# Patient Record
Sex: Female | Born: 1960 | Race: Black or African American | Hispanic: No | Marital: Married | Smoking: Current every day smoker
Health system: Southern US, Community
[De-identification: ages and names within clinical notes are randomized; demographics above are authoritative.]

## PROBLEM LIST (undated history)

## (undated) DIAGNOSIS — G4730 Sleep apnea, unspecified: Secondary | ICD-10-CM

## (undated) DIAGNOSIS — E041 Nontoxic single thyroid nodule: Secondary | ICD-10-CM

## (undated) DIAGNOSIS — Z9221 Personal history of antineoplastic chemotherapy: Secondary | ICD-10-CM

## (undated) DIAGNOSIS — R011 Cardiac murmur, unspecified: Secondary | ICD-10-CM

## (undated) DIAGNOSIS — C50919 Malignant neoplasm of unspecified site of unspecified female breast: Secondary | ICD-10-CM

## (undated) DIAGNOSIS — F1011 Alcohol abuse, in remission: Secondary | ICD-10-CM

## (undated) DIAGNOSIS — H409 Unspecified glaucoma: Secondary | ICD-10-CM

## (undated) DIAGNOSIS — F1911 Other psychoactive substance abuse, in remission: Secondary | ICD-10-CM

## (undated) DIAGNOSIS — Z923 Personal history of irradiation: Secondary | ICD-10-CM

## (undated) HISTORY — DX: Other psychoactive substance abuse, in remission: F19.11

## (undated) HISTORY — DX: Cardiac murmur, unspecified: R01.1

## (undated) HISTORY — PX: BREAST BIOPSY: SHX20

## (undated) HISTORY — DX: Malignant neoplasm of unspecified site of unspecified female breast: C50.919

## (undated) HISTORY — DX: Alcohol abuse, in remission: F10.11

## (undated) HISTORY — DX: Unspecified glaucoma: H40.9

---

## 1983-03-29 HISTORY — PX: UNILATERAL SALPINGECTOMY: SHX6160

## 1983-03-29 HISTORY — PX: DILATION AND CURETTAGE OF UTERUS: SHX78

## 1989-03-28 HISTORY — PX: TUBAL LIGATION: SHX77

## 2002-03-28 HISTORY — PX: BREAST LUMPECTOMY: SHX2

## 2017-03-28 HISTORY — PX: BREAST BIOPSY: SHX20

## 2017-04-04 LAB — HM MAMMOGRAPHY

## 2017-08-26 HISTORY — PX: MASTECTOMY: SHX3

## 2019-01-25 ENCOUNTER — Other Ambulatory Visit: Payer: Self-pay

## 2019-01-25 DIAGNOSIS — Z20822 Contact with and (suspected) exposure to covid-19: Secondary | ICD-10-CM

## 2019-01-26 LAB — NOVEL CORONAVIRUS, NAA: SARS-CoV-2, NAA: NOT DETECTED

## 2019-02-13 ENCOUNTER — Other Ambulatory Visit: Payer: Self-pay

## 2019-02-13 ENCOUNTER — Telehealth: Payer: Self-pay | Admitting: Family Medicine

## 2019-02-13 ENCOUNTER — Ambulatory Visit (INDEPENDENT_AMBULATORY_CARE_PROVIDER_SITE_OTHER): Payer: Managed Care, Other (non HMO) | Admitting: Family Medicine

## 2019-02-13 ENCOUNTER — Encounter: Payer: Self-pay | Admitting: Family Medicine

## 2019-02-13 VITALS — BP 146/70 | HR 79 | Temp 98.3°F | Resp 16 | Ht 62.25 in | Wt 224.2 lb

## 2019-02-13 DIAGNOSIS — Z9012 Acquired absence of left breast and nipple: Secondary | ICD-10-CM

## 2019-02-13 DIAGNOSIS — R0789 Other chest pain: Secondary | ICD-10-CM

## 2019-02-13 DIAGNOSIS — Z720 Tobacco use: Secondary | ICD-10-CM | POA: Insufficient documentation

## 2019-02-13 DIAGNOSIS — Z122 Encounter for screening for malignant neoplasm of respiratory organs: Secondary | ICD-10-CM | POA: Diagnosis not present

## 2019-02-13 DIAGNOSIS — R918 Other nonspecific abnormal finding of lung field: Secondary | ICD-10-CM

## 2019-02-13 DIAGNOSIS — Z853 Personal history of malignant neoplasm of breast: Secondary | ICD-10-CM

## 2019-02-13 DIAGNOSIS — H4010X0 Unspecified open-angle glaucoma, stage unspecified: Secondary | ICD-10-CM | POA: Insufficient documentation

## 2019-02-13 DIAGNOSIS — H4010X3 Unspecified open-angle glaucoma, severe stage: Secondary | ICD-10-CM

## 2019-02-13 DIAGNOSIS — Z9013 Acquired absence of bilateral breasts and nipples: Secondary | ICD-10-CM | POA: Insufficient documentation

## 2019-02-13 HISTORY — DX: Other nonspecific abnormal finding of lung field: R91.8

## 2019-02-13 HISTORY — DX: Unspecified open-angle glaucoma, stage unspecified: H40.10X0

## 2019-02-13 HISTORY — DX: Personal history of malignant neoplasm of breast: Z85.3

## 2019-02-13 HISTORY — DX: Acquired absence of left breast and nipple: Z90.12

## 2019-02-13 HISTORY — DX: Other chest pain: R07.89

## 2019-02-13 HISTORY — DX: Tobacco use: Z72.0

## 2019-02-13 MED ORDER — TRAVOPROST (BAK FREE) 0.004 % OP SOLN: 1.0000 [drp] | Freq: Every day | OPHTHALMIC | 1 refills | Status: DC

## 2019-02-13 NOTE — Patient Instructions (Signed)
It was great to meet you!  Our referral coordinator will call you to help with referrals. It may take 1-2 weeks.   I will work on getting records.   Plan to check in with me at least 1 time per year and as often as you need to

## 2019-02-13 NOTE — Assessment & Plan Note (Signed)
Details unclear but was seeing pulm for cancer screening. Referral today and plan to obtain records.

## 2019-02-13 NOTE — Assessment & Plan Note (Signed)
Suspect this is post-op muscle related issue given duration. Offered PT now, but due to insurance pt would like to focus on oncology f/u to discuss. Placed referral today.

## 2019-02-13 NOTE — Progress Notes (Signed)
Subjective:     Jaime [name] is a 58 y.o. female presenting for Establish Care (previous PCP Dr Stefani Dama in [place]) and Spasms on the left side of the chest (off and on for a few months. Had mastectomy done also on that side.)     HPI   #Breast cancer - last episode was Jan 2019 - needs an oncologist - had a mastectomy - has not started the pill that her previous oncologist recommended - has not seen oncologist since Sept 2019 - moved and had insurance - is not currently on treatment - will come on randomly - sharp pain - starts under the armpit and travels to the center of the chest - will massage, raise her arm, drink water to help with them - occur - 2-3 times a day but not every day, 3-4 times a week - lasts - 5-10 minutes each time - improves with sitting still and relaxing - worse with tensing up - symptoms started since surgery June 2019    #tobacco use - 1/2 ppd - has tried chantix and the patch in the past - choosing coffee and cig vs picking up a drink  Review of Systems See HPI  Social History   Tobacco Use  Smoking Status Current Every Day Smoker  . Packs/day: 0.50  . Years: 35.00  . Pack years: 17.50  . Types: Cigarettes  Smokeless Tobacco Never Used        Objective:    BP Readings from Last 3 Encounters:  02/13/19 (!) 146/70   Wt Readings from Last 3 Encounters:  02/13/19 224 lb 4 oz (101.7 kg)    BP (!) 146/70   Pulse 79   Temp 98.3 F (36.8 C)   Resp 16   Ht 5' 2.25" (1.581 m)   Wt 224 lb 4 oz (101.7 kg)   SpO2 99%   BMI 40.69 kg/m    Physical Exam Constitutional:      General: She is not in acute distress.    Appearance: She is well-developed. She is not diaphoretic.  HENT:     Right Ear: External ear normal.     Left Ear: External ear normal.     Nose: Nose normal.  Eyes:     Conjunctiva/sclera: Conjunctivae normal.  Neck:     Musculoskeletal: Neck supple.  Cardiovascular:     Rate and Rhythm: Normal rate  and regular rhythm.     Heart sounds: No murmur.  Pulmonary:     Effort: Pulmonary effort is normal. No respiratory distress.     Breath sounds: Normal breath sounds. No wheezing.  Chest:     Comments: Some costal-sternal ttp. No TTP along the left axillary area. Breast exam deferred Musculoskeletal:     Comments: Normal arm ROM but with some pain and pulling  Skin:    General: Skin is warm and dry.     Capillary Refill: Capillary refill takes less than 2 seconds.  Neurological:     Mental Status: She is alert. Mental status is at baseline.  Psychiatric:        Mood and Affect: Mood normal.        Behavior: Behavior normal.           Assessment & Plan:   Problem List Items Addressed This Visit      Other   Open-angle glaucoma    Severe per patient. Needs follow-up with ophtho      Relevant Medications   Travoprost, BAK Free, (  TRAVATAN) 0.004 % SOLN ophthalmic solution   Other Relevant Orders   Ambulatory referral to Ophthalmology   Hx of breast cancer - Primary   Relevant Orders   Ambulatory referral to Oncology   Tobacco use   Relevant Orders   Ambulatory Referral for Lung Cancer Scre   S/P left mastectomy   Relevant Orders   Ambulatory referral to Oncology   Atypical chest pain    Suspect this is post-op muscle related issue given duration. Offered PT now, but due to insurance pt would like to focus on oncology f/u to discuss. Placed referral today.       Relevant Orders   Ambulatory referral to Oncology   Lung mass    Details unclear but was seeing pulm for cancer screening. Referral today and plan to obtain records.       Relevant Orders   Ambulatory Referral for Lung Cancer Scre    Other Visit Diagnoses    Encounter for screening for lung cancer       Relevant Orders   Ambulatory Referral for Lung Cancer Scre       Return in about 1 year (around 02/13/2020).  Lesleigh Noe, MD

## 2019-02-13 NOTE — Assessment & Plan Note (Signed)
Severe per patient. Needs follow-up with ophtho

## 2019-02-13 NOTE — Telephone Encounter (Signed)
[facility] HIM Dept received a call from [facility] at [facility], she called to let us know that Anyelina Lainhart is not a patient of there. 02/13/19  KLM

## 2019-02-14 ENCOUNTER — Telehealth: Payer: Self-pay

## 2019-02-14 NOTE — Telephone Encounter (Signed)
PA approved for Travoprost eye drops. Dates are 01/15/2019-02/13/2022. Pharmacy notified by fax

## 2019-03-04 ENCOUNTER — Telehealth: Payer: Self-pay | Admitting: *Deleted

## 2019-03-04 DIAGNOSIS — Z87891 Personal history of nicotine dependence: Secondary | ICD-10-CM

## 2019-03-04 DIAGNOSIS — Z122 Encounter for screening for malignant neoplasm of respiratory organs: Secondary | ICD-10-CM

## 2019-03-04 NOTE — Telephone Encounter (Signed)
Received referral for initial lung cancer screening scan. Contacted patient and obtained smoking history,(current, 31.5) as well as answering questions related to screening process. Patient denies signs of lung cancer such as weight loss or hemoptysis. Patient denies comorbidity that would prevent curative treatment if lung cancer were found. Patient is scheduled for shared decision making visit and CT scan on 03/12/19 at 130pm.

## 2019-03-06 ENCOUNTER — Encounter: Payer: Self-pay | Admitting: Oncology

## 2019-03-06 ENCOUNTER — Other Ambulatory Visit: Payer: Self-pay

## 2019-03-06 ENCOUNTER — Inpatient Hospital Stay: Payer: Managed Care, Other (non HMO) | Attending: Oncology | Admitting: Oncology

## 2019-03-06 ENCOUNTER — Telehealth: Payer: Self-pay | Admitting: *Deleted

## 2019-03-06 ENCOUNTER — Inpatient Hospital Stay: Payer: Managed Care, Other (non HMO)

## 2019-03-06 VITALS — BP 148/82 | HR 78 | Temp 96.4°F | Resp 18 | Wt 226.7 lb

## 2019-03-06 DIAGNOSIS — F17210 Nicotine dependence, cigarettes, uncomplicated: Secondary | ICD-10-CM | POA: Diagnosis not present

## 2019-03-06 DIAGNOSIS — Z9221 Personal history of antineoplastic chemotherapy: Secondary | ICD-10-CM | POA: Insufficient documentation

## 2019-03-06 DIAGNOSIS — Z95828 Presence of other vascular implants and grafts: Secondary | ICD-10-CM

## 2019-03-06 DIAGNOSIS — Z122 Encounter for screening for malignant neoplasm of respiratory organs: Secondary | ICD-10-CM

## 2019-03-06 DIAGNOSIS — Z853 Personal history of malignant neoplasm of breast: Secondary | ICD-10-CM | POA: Diagnosis present

## 2019-03-06 DIAGNOSIS — Z9012 Acquired absence of left breast and nipple: Secondary | ICD-10-CM | POA: Diagnosis not present

## 2019-03-06 DIAGNOSIS — Z720 Tobacco use: Secondary | ICD-10-CM

## 2019-03-06 DIAGNOSIS — Z171 Estrogen receptor negative status [ER-]: Secondary | ICD-10-CM | POA: Diagnosis not present

## 2019-03-06 LAB — COMPREHENSIVE METABOLIC PANEL
ALT: 10 U/L (ref 0–44)
AST: 14 U/L — ABNORMAL LOW (ref 15–41)
Albumin: 3.9 g/dL (ref 3.5–5.0)
Alkaline Phosphatase: 106 U/L (ref 38–126)
Anion gap: 6 (ref 5–15)
BUN: 10 mg/dL (ref 6–20)
CO2: 25 mmol/L (ref 22–32)
Calcium: 9.4 mg/dL (ref 8.9–10.3)
Chloride: 108 mmol/L (ref 98–111)
Creatinine, Ser: 0.6 mg/dL (ref 0.44–1.00)
GFR calc Af Amer: >60 mL/min (ref >60)
GFR calc non Af Amer: >60 mL/min (ref >60)
Glucose, Bld: 110 mg/dL — ABNORMAL HIGH (ref 70–99)
Potassium: 3.9 mmol/L (ref 3.5–5.1)
Sodium: 139 mmol/L (ref 135–145)
Total Bilirubin: 0.7 mg/dL (ref 0.3–1.2)
Total Protein: 7.2 g/dL (ref 6.5–8.1)

## 2019-03-06 LAB — CBC WITH DIFFERENTIAL/PLATELET
Abs Immature Granulocytes: 0.01 10*3/uL (ref 0.00–0.07)
Basophils Absolute: 0 10*3/uL (ref 0.0–0.1)
Basophils Relative: 1 %
Eosinophils Absolute: 0.1 10*3/uL (ref 0.0–0.5)
Eosinophils Relative: 2 %
HCT: 40.6 % (ref 36.0–46.0)
Hemoglobin: 13 g/dL (ref 12.0–15.0)
Immature Granulocytes: 0 %
Lymphocytes Relative: 43 %
Lymphs Abs: 2.1 10*3/uL (ref 0.7–4.0)
MCH: 28.4 pg (ref 26.0–34.0)
MCHC: 32 g/dL (ref 30.0–36.0)
MCV: 88.6 fL (ref 80.0–100.0)
Monocytes Absolute: 0.4 10*3/uL (ref 0.1–1.0)
Monocytes Relative: 7 %
Neutro Abs: 2.3 10*3/uL (ref 1.7–7.7)
Neutrophils Relative %: 47 %
Platelets: 248 10*3/uL (ref 150–400)
RBC: 4.58 MIL/uL (ref 3.87–5.11)
RDW: 13.3 % (ref 11.5–15.5)
WBC: 4.9 10*3/uL (ref 4.0–10.5)
nRBC: 0 % (ref 0.0–0.2)

## 2019-03-06 MED ORDER — SODIUM CHLORIDE 0.9% FLUSH
10.0000 mL | Freq: Once | INTRAVENOUS | Status: AC
  Administered 2019-03-06: 10 mL via INTRAVENOUS
  Filled 2019-03-06: qty 10

## 2019-03-06 MED ORDER — HEPARIN SOD (PORK) LOCK FLUSH 100 UNIT/ML IV SOLN
500.0000 [IU] | Freq: Once | INTRAVENOUS | Status: AC
  Administered 2019-03-06: 12:00:00 500 [IU] via INTRAVENOUS

## 2019-03-06 MED ORDER — SODIUM CHLORIDE 0.9% FLUSH
10.0000 mL | Freq: Once | INTRAVENOUS | Status: DC
  Filled 2019-03-06: qty 10

## 2019-03-06 MED ORDER — HEPARIN SOD (PORK) LOCK FLUSH 100 UNIT/ML IV SOLN: 500.0000 [IU] | Freq: Once | INTRAVENOUS | Status: DC

## 2019-03-06 NOTE — Telephone Encounter (Signed)
Schedule mammo per 03/06/19 los.  Pt last mammo was done in [place] Per Chrystal from [facility] stated that pt has to come to [place] and  Sign a release form before they can get her scheduled for a mammogram. Pt was made aware.

## 2019-03-06 NOTE — Progress Notes (Signed)
New patient that moved to [facility] from [place] in 02/2018.  She had oncologist in [place] that she followed up with for breast cancer (Dr. Cornelia Copa @ [facility] [address]).  There are notes scanned in her chart that was obtained by her PCP.

## 2019-03-07 NOTE — Progress Notes (Signed)
Hematology/Oncology Consult note [facility] [phone] [phone]   Patient Care Team: Lesleigh Noe, MD as PCP - General (Family Medicine)  REFERRING PROVIDER: Lesleigh Noe, MD  CHIEF COMPLAINTS/REASON FOR VISIT:  Establish care for history of breast cancer  HISTORY OF PRESENTING ILLNESS:   Jaime [name] is a  58 y.o.  female with PMH listed below was seen in consultation at the request of  Lesleigh Noe, MD to establish care for history of breast cancer. Patient moved from [place] to [place] in December 2019. Oncological care was in [place]. Extensive medical records review was performed by me.  #Right breast cancer in 2004.  Per note,pN1cN1cM0 triple negative right breast cancer status post lumpectomy and ALND with Dr.Bauer showing poorly differentiated carcinoma, 2 cm, negative margins, 1 out of 3 sentinel lymph nodes positive.  0 out of 7 additional lymph node positive.  She completed adjuvant dose dense AC followed by Taxol with Dr. Francoise Ceo and adjuvant radiation with Dr. Manuella Ghazi (50 [address] Cables to the right breast with a 10 Gy boost to the surgical cavity).   #Left breast cancer in 2018. She self palpated a left breast mass at the end of 2018.  Was found to have synchronous left breast cancers, 2.5 cm mass at 12:00 8 cm from nipple with a 1.2 cm mass at 230 location 8 cm from the nipple.  Both areas were biopsied positive for grade 3 invasive carcinoma, ER/PR negative. The larger mass was HER-2 positive.  Smaller mass was HER-2 negative. 08/22/2017 breast MRI showed a 2.4 cm left axillary lymph node with anterior eccentric 6 mm cortical thickening.  No evidence of right breast recurrence.  Left axillary ultrasound on 04/24/2017 showed benign-appearing left axillary lymph node with smooth hypoechoic 3 to 4 mm cortex and an echogenic fatty hilum.  So no biopsy was recommended. 04/12/2017 PET scan showed avid left breast lesions with  no additional disease.  Bone scan on 04/14/2017 showed focal mildly increased uptake in the lower sternum which is indeterminate but there were no concerning correlates on PET scan or breast MRI so no aggressive etiology was favored. Patient completed 6 cycles of neoadjuvant chemotherapy with Dr. Francoise Ceo [facility]P ending 08/17/2017.  Patient had a significant response on MRI with 8 mm residual at the 12 o'clock position and a 5 mm residual at the 2:30 position. Patient underwent left simple mastectomy and a sentinel lymph node biopsy on 09/15/2017.  Per note, pathology showed 3 mm residual grade 3 invasive carcinoma in the 12 o'clock position and 0.5 mm residual grade 3 invasive carcinoma at the 3 o'clock position.  Negative margins.  No lymphovascular invasion.  0 out of 5 lymph node positive.ypT1aN0(sn), Per note, patient's case was discussed on multidisciplinary breast cancer conference.  And patient was considered to have to synchronous left breast cancer. Clinical Stage IIA cT2N0M0 ER-/PR-/HER2+ Clinical Stage 1A cT1N0M0 ER-/PR-/HER2-  Both were ypT1aN0(sn), grade 3.   Patient evaluated by radiation oncology and was advised close surveillance without radiation to the chest wall at that time. I do not have medical records reflecting her post surgery treatments.  Per patient she received additional infusion treatments and was offered to start on " a pill" for maintenance treatments in late 2019.  At that time she is in the process of relocating to [place] so she declines the treatment.  Historical risk factors, menarche age 84, G37 P2, LMP was just prior to starting her first chemotherapy in  2004.  No family history of ovarian cancer or breast cancer.  Patient opted against genetic testing at previous cancer center..   Patient reports that her daughter was tested and was negative.-In her medical records, daughter has CDK4 VUS  Patient reports feeling well at baseline today.  She wears prosthesis  bra.  She denies any concerns of her right breast or mastectomy sites.  Denies any bone pain, fever, chills, unintentional weight loss, night sweating.  Review of Systems  Constitutional: Negative for appetite change, chills, fatigue and fever.  HENT:   Negative for hearing loss and voice change.   Eyes: Negative for eye problems.  Respiratory: Negative for chest tightness and cough.   Cardiovascular: Negative for chest pain.  Gastrointestinal: Negative for abdominal distention, abdominal pain and blood in stool.  Endocrine: Negative for hot flashes.  Genitourinary: Negative for difficulty urinating and frequency.   Musculoskeletal: Negative for arthralgias.  Skin: Negative for itching and rash.  Neurological: Negative for extremity weakness.  Hematological: Negative for adenopathy.  Psychiatric/Behavioral: Negative for confusion.    MEDICAL HISTORY:  Past Medical History:  Diagnosis Date  . Breast cancer ([place])    x 2  . Glaucoma   . Heart murmur   . History of alcohol abuse   . History of drug abuse ([place])     SURGICAL HISTORY: Past Surgical History:  Procedure Laterality Date  . BREAST LUMPECTOMY Right 2001  . MASTECTOMY Left 08/2017  . TUBAL LIGATION  1991    SOCIAL HISTORY: Social History   Socioeconomic History  . Marital status: Married    Spouse name: Not on file  . Number of children: 2  . Years of education: associates  . Highest education level: Not on file  Occupational History  . Not on file  Tobacco Use  . Smoking status: Current Every Day Smoker    Packs/day: 0.50    Years: 35.00    Pack years: 17.50    Types: Cigarettes  . Smokeless tobacco: Never Used  Substance and Sexual Activity  . Alcohol use: Not Currently    Comment: quit in 1999  . Drug use: Not Currently    Types: Marijuana, Cocaine    Comment: quit in 1996-street drugs  . Sexual activity: Not Currently  Other Topics Concern  . Not on file  Social History Narrative   02/13/19    From: Born in [place] but grew up in [place]: with daughter (separating from her husband)   Work: currently on disability      Family: Daughter - Jaime [name], Son - Jaime [name], has one step son in [place]      Enjoys: walking, picnics, bowling, reading, playing cards      Exercise: walking - tries to do everyday - uses fitbit and tries to get 5000 steps   Diet: veggies, water, 2-3 cups of coffee, trying to monitor what she eats      Safety   Seat belts: Yes    Guns: No   Safe in relationships: Yes    Social Determinants of Health   Financial Resource Strain: Medium Risk  . Difficulty of Paying Living Expenses: Somewhat hard  Food Insecurity:   . Worried About Charity fundraiser in the Last Year: Not on file  . Ran Out of Food in the Last Year: Not on file  Transportation Needs:   . Lack of Transportation (Medical): Not on file  . Lack of Transportation (Non-Medical): Not on file  Physical Activity:   .  Days of Exercise per Week: Not on file  . Minutes of Exercise per Session: Not on file  Stress:   . Feeling of Stress : Not on file  Social Connections:   . Frequency of Communication with Friends and Family: Not on file  . Frequency of Social Gatherings with Friends and Family: Not on file  . Attends Religious Services: Not on file  . Active Member of Clubs or Organizations: Not on file  . Attends Archivist Meetings: Not on file  . Marital Status: Not on file  Intimate Partner Violence:   . Fear of Current or Ex-Partner: Not on file  . Emotionally Abused: Not on file  . Physically Abused: Not on file  . Sexually Abused: Not on file    FAMILY HISTORY: Family History  Problem Relation Age of Onset  . Alcohol abuse Mother   . Lung cancer Mother   . Esophageal cancer Mother   . Alcohol abuse Father   . Arthritis Father   . Kidney disease Father   . Arthritis Sister   . Diabetes Sister   . Arthritis Brother   . Hypertension Brother   . Sickle cell  anemia Brother   . HIV Brother   . Heart attack Brother 33       massive MI  . Thyroid disease Brother     ALLERGIES:  is allergic to latex.  MEDICATIONS:  Current Outpatient Medications  Medication Sig Dispense Refill  . ASPIRIN 81 PO Take 1 tablet by mouth daily.    . Travoprost, BAK Free, (TRAVATAN) 0.004 % SOLN ophthalmic solution Place 1 drop into both eyes at bedtime. 2.5 mL 1   No current facility-administered medications for this visit.     PHYSICAL EXAMINATION: ECOG PERFORMANCE STATUS: 0 - Asymptomatic Vitals:   03/06/19 1039  BP: (!) 148/82  Pulse: 78  Resp: 18  Temp: (!) 96.4 F (35.8 C)   Filed Weights   03/06/19 1039  Weight: 226 lb 11.2 oz (102.8 kg)    Physical Exam Constitutional:      General: She is not in acute distress. HENT:     Head: Normocephalic and atraumatic.  Eyes:     General: No scleral icterus.    Pupils: Pupils are equal, round, and reactive to light.  Cardiovascular:     Rate and Rhythm: Normal rate and regular rhythm.     Heart sounds: Normal heart sounds.  Pulmonary:     Effort: Pulmonary effort is normal. No respiratory distress.     Breath sounds: No wheezing.  Abdominal:     General: Bowel sounds are normal. There is no distension.     Palpations: Abdomen is soft. There is no mass.     Tenderness: There is no abdominal tenderness.  Musculoskeletal:        General: No deformity. Normal range of motion.     Cervical back: Normal range of motion and neck supple.  Skin:    General: Skin is warm and dry.     Findings: No erythema or rash.  Neurological:     Mental Status: She is alert and oriented to person, place, and time.     Cranial Nerves: No cranial nerve deficit.     Coordination: Coordination normal.  Psychiatric:        Behavior: Behavior normal.        Thought Content: Thought content normal.   Breast exam is performed in seated and lying down position. Patient is status  post left mastectomy without  reconstruction. The implant edges are intact and there is no evidence of any chest wall recurrence. No evidence of bilateral axillary adenopathy     LABORATORY DATA:  I have reviewed the data as listed Lab Results  Component Value Date   WBC 4.9 03/06/2019   HGB 13.0 03/06/2019   HCT 40.6 03/06/2019   MCV 88.6 03/06/2019   PLT 248 03/06/2019   Recent Labs    03/06/19 1144  NA 139  K 3.9  CL 108  CO2 25  GLUCOSE 110*  BUN 10  CREATININE 0.60  CALCIUM 9.4  GFRNONAA >60  GFRAA >60  PROT 7.2  ALBUMIN 3.9  AST 14*  ALT 10  ALKPHOS 106  BILITOT 0.7   Iron/TIBC/Ferritin/ %Sat No results found for: IRON, TIBC, FERRITIN, IRONPCTSAT    RADIOGRAPHIC STUDIES: I have personally reviewed the radiological images as listed and agreed with the findings in the report. No results found.    ASSESSMENT & PLAN:  1. History of breast cancer in female   2. S/P left mastectomy   3. Tobacco use   4. Port-A-Cath in place    Medical records were reviewed and discussed with patient. Per note, patient has remote right triple negative breast cancer status post lumpectomy and sentinel lymph node biopsy,.  Status post adjuvant chemotherapy with dose dense Adriamycin followed by Taxol and adjuvant radiation Also left synchronous breast cancer, ER/PR negative, one of the breast cancer lesion was HER-2 positive, one of the breast cancer lesion was HER-2 negative.  Status post neoadjuvant TCHP followed by left simple mastectomy and sentinel lymph node biopsy. Patient is clinically doing well.  We reviewed the surveillance plan. Recommend patient to continue right breast screening mammogram annually. Check CBC CMP today.  Patient will follow up and have history and physical every 3- 4 months, CT images if clinically indicated. #Port-A-Cath in place, patient reports that the port was last used more than 1 year ago.  We will schedule patient to have port flush every 6 to 8 weeks. #Patient is current  everyday smoker, patient is with lung cancer screening program and has a CT chest lung cancer screening scheduled in December. Smoking cessation was discussed with patient. Orders Placed This Encounter  Procedures  . MM 3D SCREEN BREAST UNI RIGHT    Standing Status:   Future    Standing Expiration Date:   05/06/2020    Order Specific Question:   Reason for Exam (SYMPTOM  OR DIAGNOSIS REQUIRED)    Answer:   histoy of breast cancer in 2004    Order Specific Question:   Is the patient pregnant?    Answer:   No    Order Specific Question:   Preferred imaging location?    Answer:   [facility]  . CBC with Differential    Standing Status:   Future    Standing Expiration Date:   03/05/2020  . Comprehensive metabolic panel    Standing Status:   Future    Standing Expiration Date:   03/05/2020  . CBC with Differential    Standing Status:   Future    Number of Occurrences:   1    Standing Expiration Date:   03/05/2020  . Comprehensive metabolic panel    Standing Status:   Future    Number of Occurrences:   1    Standing Expiration Date:   03/05/2020    All questions were answered. The patient knows to call the  clinic with any problems questions or concerns.   Lesleigh Noe, MD    Return of visit: 3 months thank you for this kind referral and the opportunity to participate in the care of this patient. A copy of today's note is routed to referring provider  Total face to face encounter time for this patient visit was 60 min. >50% of the time was  spent in counseling and coordination of care.    Earlie Server, MD, PhD Hematology Oncology [facility] at [facility] Pager- [phone] 03/07/2019

## 2019-03-11 ENCOUNTER — Encounter: Payer: Self-pay | Admitting: Nurse Practitioner

## 2019-03-12 ENCOUNTER — Other Ambulatory Visit: Payer: Self-pay

## 2019-03-12 ENCOUNTER — Inpatient Hospital Stay (HOSPITAL_BASED_OUTPATIENT_CLINIC_OR_DEPARTMENT_OTHER): Payer: Managed Care, Other (non HMO) | Admitting: Nurse Practitioner

## 2019-03-12 ENCOUNTER — Ambulatory Visit
Admission: RE | Admit: 2019-03-12 | Discharge: 2019-03-12 | Disposition: A | Discharge status: Home / Self Care | Payer: Managed Care, Other (non HMO) | Source: Ambulatory Visit | Attending: Nurse Practitioner | Admitting: Nurse Practitioner

## 2019-03-12 DIAGNOSIS — F17210 Nicotine dependence, cigarettes, uncomplicated: Secondary | ICD-10-CM | POA: Diagnosis not present

## 2019-03-12 DIAGNOSIS — Z122 Encounter for screening for malignant neoplasm of respiratory organs: Secondary | ICD-10-CM | POA: Diagnosis present

## 2019-03-12 DIAGNOSIS — Z87891 Personal history of nicotine dependence: Secondary | ICD-10-CM | POA: Insufficient documentation

## 2019-03-12 HISTORY — DX: Personal history of nicotine dependence: Z87.891

## 2019-03-12 NOTE — Progress Notes (Signed)
Virtual Visit via Video Enabled Telemedicine Note   I connected with Jaime [name] on 03/12/19 at 1:30 PM EST by video enabled telemedicine visit and verified that I am speaking with the correct person using two identifiers.   I discussed the limitations, risks, security and privacy concerns of performing an evaluation and management service by telemedicine and the availability of in-person appointments. I also discussed with the patient that there may be a patient responsible charge related to this service. The patient expressed understanding and agreed to proceed.   Other persons participating in the visit and their role in the encounter: Burgess Estelle, RN- checking in patient & navigation  Patient's location: [place]  Provider's location: Clinic  Chief Complaint: Low Dose CT Screening  Patient agreed to evaluation by telemedicine to discuss shared decision making for consideration of low dose CT lung cancer screening.    In accordance with CMS guidelines, patient has met eligibility criteria including age, absence of signs or symptoms of lung cancer.  Social History   Tobacco Use  . Smoking status: Current Every Day Smoker    Packs/day: 0.75    Years: 42.00    Pack years: 31.50    Types: Cigarettes  . Smokeless tobacco: Never Used  Substance Use Topics  . Alcohol use: Not Currently    Comment: quit in 1999     A shared decision-making session was conducted prior to the performance of CT scan. This includes one or more decision aids, includes benefits and harms of screening, follow-up diagnostic testing, over-diagnosis, false positive rate, and total radiation exposure.   Counseling on the importance of adherence to annual lung cancer LDCT screening, impact of co-morbidities, and ability or willingness to undergo diagnosis and treatment is imperative for compliance of the program.   Counseling on the importance of continued smoking cessation for former smokers; the  importance of smoking cessation for current smokers, and information about tobacco cessation interventions have been given to patient including [place] and [facility] [place] programs.   Written order for lung cancer screening with LDCT has been given to the patient and any and all questions have been answered to the best of my abilities.    Yearly follow up will be coordinated by Burgess Estelle, Thoracic Navigator.  I discussed the assessment and treatment plan with the patient. The patient was provided an opportunity to ask questions and all were answered. The patient agreed with the plan and demonstrated an understanding of the instructions.   The patient was advised to call back or seek an in-person evaluation if the symptoms worsen or if the condition fails to improve as anticipated.   I provided 15 minutes of face-to-face video visit time during this encounter, and > 50% was spent counseling as documented under my assessment & plan.   Beckey Rutter, DNP, AGNP-C [name] at [facility] [phone] (clinic)

## 2019-03-14 ENCOUNTER — Encounter: Payer: Self-pay | Admitting: *Deleted

## 2019-03-14 ENCOUNTER — Encounter: Payer: Self-pay | Admitting: Oncology

## 2019-03-23 ENCOUNTER — Encounter: Payer: Self-pay | Admitting: Family Medicine

## 2019-03-25 ENCOUNTER — Ambulatory Visit (INDEPENDENT_AMBULATORY_CARE_PROVIDER_SITE_OTHER): Payer: Managed Care, Other (non HMO) | Admitting: Family Medicine

## 2019-03-25 ENCOUNTER — Other Ambulatory Visit: Payer: Self-pay | Admitting: Family Medicine

## 2019-03-25 ENCOUNTER — Encounter: Payer: Self-pay | Admitting: Family Medicine

## 2019-03-25 VITALS — Temp 97.7°F

## 2019-03-25 DIAGNOSIS — R599 Enlarged lymph nodes, unspecified: Secondary | ICD-10-CM | POA: Diagnosis not present

## 2019-03-25 DIAGNOSIS — H4010X3 Unspecified open-angle glaucoma, severe stage: Secondary | ICD-10-CM

## 2019-03-25 NOTE — Progress Notes (Signed)
I connected with Jaime [name] on 03/25/19 at 12:20 PM EST by video and verified that I am speaking with the correct person using two identifiers.   I discussed the limitations, risks, security and privacy concerns of performing an evaluation and management service by video/Telephone and the availability of in person appointments. I also discussed with the patient that there may be a patient responsible charge related to this service. The patient expressed understanding and agreed to proceed.  Patient location: Home Provider Location: [place] Participants: Lesleigh Noe and Jaime [name]   Subjective:     Jaime [name] is a 58 y.o. female presenting for Sore Throat (has a swollen gland on left side of her neck, sometimes hard to swallow. Pain shoots up to herleft ear and left temple area at times.)     Sore Throat  This is a new problem. The current episode started in the past 7 days. The problem has been gradually improving. The pain is worse on the left side. There has been no fever. The pain is moderate. Associated symptoms include ear pain, headaches (mild throbbing), swollen glands (under the jaw on left side) and trouble swallowing. Pertinent negatives include no congestion, coughing, diarrhea, shortness of breath or vomiting. She has tried NSAIDs for the symptoms. The treatment provided moderate relief.   Notes that after eating blue cheese - was spitting up food, sensation of something getting stuck -- only has happened 2 times since the swelling began  Was difficult to sleep at night - so uncomfortable  Spot tender to the touch - the swelling area (the size of a grape) is gone  Review of Systems  Constitutional: Negative for chills and fever.  HENT: Positive for ear pain and trouble swallowing. Negative for congestion.   Respiratory: Negative for cough and shortness of breath.   Gastrointestinal: Positive for nausea (spitting up after eating).  Negative for diarrhea and vomiting.  Neurological: Positive for headaches (mild throbbing).     Social History   Tobacco Use  Smoking Status Current Every Day Smoker  . Packs/day: 0.75  . Years: 42.00  . Pack years: 31.50  . Types: Cigarettes  Smokeless Tobacco Never Used        Objective:   BP Readings from Last 3 Encounters:  03/06/19 (!) 148/82  02/13/19 (!) 146/70   Wt Readings from Last 3 Encounters:  03/12/19 224 lb (101.6 kg)  03/06/19 226 lb 11.2 oz (102.8 kg)  02/13/19 224 lb 4 oz (101.7 kg)   Temp 97.7 F (36.5 C) Comment: per patient   Exam: Prior to failed video - patient alert, walking without difficulty After video Speaking on complete sentences w/o difficulty  No issues Normal mood         Assessment & Plan:   Problem List Items Addressed This Visit    None    Visit Diagnoses    Swollen lymph nodes    -  Primary     Suspect reactive lymph node and likely viral as already improving Return/ER precautions discussed Trouble swallowing - on 2 occasions - if getting worse or happening after pain resolved return Next visit should be in person either at respiratory clinic or urgent care Discussed that symptoms would be atypical for covid, but could consider getting testing to rule that out.     Interactive audio and video telecommunications were attempted between this provider and patient, however failed, due to patient having technical difficulties OR patient did not have  access to video capability.  We continued and completed visit with audio only.   Start Time: 12:37 End Time: 12:47    Return if symptoms worsen or fail to improve.  Lesleigh Noe, MD

## 2019-04-17 ENCOUNTER — Inpatient Hospital Stay: Payer: Managed Care, Other (non HMO) | Attending: Oncology

## 2019-04-17 ENCOUNTER — Other Ambulatory Visit: Payer: Self-pay

## 2019-04-17 DIAGNOSIS — Z171 Estrogen receptor negative status [ER-]: Secondary | ICD-10-CM | POA: Diagnosis not present

## 2019-04-17 DIAGNOSIS — Z853 Personal history of malignant neoplasm of breast: Secondary | ICD-10-CM | POA: Diagnosis present

## 2019-04-17 DIAGNOSIS — Z95828 Presence of other vascular implants and grafts: Secondary | ICD-10-CM

## 2019-04-17 DIAGNOSIS — Z452 Encounter for adjustment and management of vascular access device: Secondary | ICD-10-CM | POA: Insufficient documentation

## 2019-04-17 MED ORDER — SODIUM CHLORIDE 0.9% FLUSH
10.0000 mL | Freq: Once | INTRAVENOUS | Status: AC
  Administered 2019-04-17: 14:00:00 10 mL via INTRAVENOUS
  Filled 2019-04-17: qty 10

## 2019-04-17 MED ORDER — HEPARIN SOD (PORK) LOCK FLUSH 100 UNIT/ML IV SOLN
500.0000 [IU] | Freq: Once | INTRAVENOUS | Status: AC
  Administered 2019-04-17: 500 [IU] via INTRAVENOUS
  Filled 2019-04-17: qty 5

## 2019-04-24 ENCOUNTER — Other Ambulatory Visit: Payer: Self-pay | Admitting: Oncology

## 2019-04-24 ENCOUNTER — Ambulatory Visit
Admission: RE | Admit: 2019-04-24 | Discharge: 2019-04-24 | Disposition: A | Discharge status: Home / Self Care | Payer: Managed Care, Other (non HMO) | Source: Ambulatory Visit | Attending: Oncology | Admitting: Oncology

## 2019-04-24 DIAGNOSIS — N644 Mastodynia: Secondary | ICD-10-CM

## 2019-04-24 DIAGNOSIS — Z853 Personal history of malignant neoplasm of breast: Secondary | ICD-10-CM

## 2019-05-07 ENCOUNTER — Ambulatory Visit
Admission: RE | Admit: 2019-05-07 | Discharge: 2019-05-07 | Disposition: A | Discharge status: Home / Self Care | Payer: Managed Care, Other (non HMO) | Source: Ambulatory Visit | Attending: Oncology | Admitting: Oncology

## 2019-05-07 DIAGNOSIS — N644 Mastodynia: Secondary | ICD-10-CM

## 2019-05-07 HISTORY — DX: Personal history of irradiation: Z92.3

## 2019-05-07 HISTORY — DX: Personal history of antineoplastic chemotherapy: Z92.21

## 2019-05-24 ENCOUNTER — Telehealth: Payer: Self-pay | Admitting: *Deleted

## 2019-05-24 NOTE — Telephone Encounter (Signed)
Per pt request to cancel 06/03/19 port flush/lab/MD appts.Due to being out of town. She stated that she would call back at a later date to R/S

## 2019-06-03 ENCOUNTER — Ambulatory Visit: Payer: Managed Care, Other (non HMO) | Admitting: Oncology

## 2019-06-03 ENCOUNTER — Other Ambulatory Visit: Payer: Managed Care, Other (non HMO)

## 2019-07-05 ENCOUNTER — Ambulatory Visit: Payer: Self-pay | Attending: Internal Medicine

## 2019-07-05 DIAGNOSIS — Z23 Encounter for immunization: Secondary | ICD-10-CM

## 2019-07-05 NOTE — Progress Notes (Signed)
   Covid-19 Vaccination Clinic  Name:  Jaime [name]    MRN: [id number] DOB: 1960-06-19  07/05/2019  [name] was observed post Covid-19 immunization for 15 minutes without incident. She was provided with Vaccine Information Sheet and instruction to access the V-Safe system.   [name] was instructed to call 911 with any severe reactions post vaccine: Marland Kitchen Difficulty breathing  . Swelling of face and throat  . A fast heartbeat  . A bad rash all over body  . Dizziness and weakness   Immunizations Administered    Name Date Dose VIS Date Route   Pfizer COVID-19 Vaccine 07/05/2019 10:26 AM 0.3 mL 03/08/2019 Intramuscular   Manufacturer: [employer], [employer]   Lot: [id number]   [place]: [id number]

## 2019-07-29 ENCOUNTER — Ambulatory Visit: Payer: Self-pay | Attending: Internal Medicine

## 2019-07-29 DIAGNOSIS — Z23 Encounter for immunization: Secondary | ICD-10-CM

## 2019-07-29 NOTE — Progress Notes (Signed)
   Covid-19 Vaccination Clinic  Name:  Jaime [name]    MRN: [id number] DOB: [birth year]  07/29/2019  Jaime [name] was observed post Covid-19 immunization for 15 minutes without incident. She was provided with Vaccine Information Sheet and instruction to access the V-Safe system.   Jaime [name] was instructed to call 911 with any severe reactions post vaccine: Marland Kitchen Difficulty breathing  . Swelling of face and throat  . A fast heartbeat  . A bad rash all over body  . Dizziness and weakness   Immunizations Administered    Name Date Dose VIS Date Route   Pfizer COVID-19 Vaccine 07/29/2019 10:45 AM 0.3 mL 05/22/2018 Intramuscular   Manufacturer: [place]   Lot: [id number]   [place]: [id number]

## 2020-03-05 ENCOUNTER — Other Ambulatory Visit: Payer: Self-pay | Admitting: *Deleted

## 2020-03-05 DIAGNOSIS — Z87891 Personal history of nicotine dependence: Secondary | ICD-10-CM

## 2020-03-05 DIAGNOSIS — Z122 Encounter for screening for malignant neoplasm of respiratory organs: Secondary | ICD-10-CM

## 2020-03-05 NOTE — Progress Notes (Signed)
Contacted and scheduled for annual lung screening scan. Patient is a current smoker with a 31.75 pack year history.   Patient has new insurance with Medicare A/B # [id number]

## 2020-03-11 ENCOUNTER — Other Ambulatory Visit: Payer: Self-pay

## 2020-03-11 ENCOUNTER — Ambulatory Visit
Admission: RE | Admit: 2020-03-11 | Discharge: 2020-03-11 | Disposition: A | Discharge status: Home / Self Care | Payer: Medicare Other | Source: Ambulatory Visit | Attending: Nurse Practitioner | Admitting: Nurse Practitioner

## 2020-03-11 DIAGNOSIS — Z122 Encounter for screening for malignant neoplasm of respiratory organs: Secondary | ICD-10-CM | POA: Diagnosis present

## 2020-03-11 DIAGNOSIS — Z87891 Personal history of nicotine dependence: Secondary | ICD-10-CM | POA: Diagnosis present

## 2020-03-13 ENCOUNTER — Ambulatory Visit
Admission: EM | Admit: 2020-03-13 | Discharge: 2020-03-13 | Disposition: A | Discharge status: Home / Self Care | Payer: Medicare Other | Attending: Family Medicine | Admitting: Family Medicine

## 2020-03-13 ENCOUNTER — Encounter: Payer: Self-pay | Admitting: *Deleted

## 2020-03-13 ENCOUNTER — Telehealth: Payer: Self-pay

## 2020-03-13 ENCOUNTER — Encounter: Payer: Self-pay | Admitting: Family Medicine

## 2020-03-13 DIAGNOSIS — N898 Other specified noninflammatory disorders of vagina: Secondary | ICD-10-CM | POA: Diagnosis not present

## 2020-03-13 DIAGNOSIS — I700 Atherosclerosis of aorta: Secondary | ICD-10-CM

## 2020-03-13 DIAGNOSIS — J439 Emphysema, unspecified: Secondary | ICD-10-CM | POA: Insufficient documentation

## 2020-03-13 HISTORY — DX: Atherosclerosis of aorta: I70.0

## 2020-03-13 HISTORY — DX: Emphysema, unspecified: J43.9

## 2020-03-13 MED ORDER — METRONIDAZOLE 500 MG PO TABS: 500.0000 mg | ORAL_TABLET | Freq: Two times a day (BID) | ORAL | 0 refills | Status: DC

## 2020-03-13 MED ORDER — FLUCONAZOLE 150 MG PO TABS: 150.0000 mg | ORAL_TABLET | Freq: Every day | ORAL | 0 refills | Status: DC

## 2020-03-13 NOTE — ED Provider Notes (Signed)
Jaime [name]    CSN: [id number] Arrival date & time: 03/13/20  1238      History   Chief Complaint Chief Complaint  Patient presents with  . Vaginal Discharge    HPI Jaime [name] is a 59 y.o. female.   Patient is a 59 year old female who presents today with vaginal discharge and odor.  This is been present, waxing waning over the past 3 days.  Described the discharge is yellow/white and milky.  The odor is somewhat decreased but is still present.  Used Monistat over-the-counter which seemed to help some.  Recently sexually active with new partner.  No abdominal pain, back pain, fevers     Past Medical History:  Diagnosis Date  . Breast cancer ([place])    BIL  mastectomy left    lumpectomy right  . Glaucoma   . Heart murmur   . History of alcohol abuse   . History of drug abuse ([place])   . Personal history of chemotherapy   . Personal history of radiation therapy     Patient Active Problem List   Diagnosis Date Noted  . Personal history of tobacco use, presenting hazards to health 03/12/2019  . Open-angle glaucoma 02/13/2019  . Hx of breast cancer 02/13/2019  . Tobacco use 02/13/2019  . S/P left mastectomy 02/13/2019  . Atypical chest pain 02/13/2019  . Lung mass 02/13/2019    Past Surgical History:  Procedure Laterality Date  . BREAST BIOPSY    . BREAST LUMPECTOMY Right 2004   triple Neg breast CA  . MASTECTOMY Left 08/2017  . TUBAL LIGATION  1991    OB History   No obstetric history on file.      Home Medications    Prior to Admission medications   Medication Sig Start Date End Date Taking? Authorizing Provider  ASPIRIN 81 PO Take 1 tablet by mouth daily.    [provider]  fluconazole (DIFLUCAN) 150 MG tablet Take 1 tablet (150 mg total) by mouth daily. 03/13/20   Loura Halt A, NP  metroNIDAZOLE (FLAGYL) 500 MG tablet Take 1 tablet (500 mg total) by mouth 2 (two) times daily. 03/13/20   Kennth Vanbenschoten, Tressia Miners A, NP  Travoprost, BAK  Free, (TRAVATAN) 0.004 % SOLN ophthalmic solution INSTILL 1 DROP BOTH EYES EVERY NIGHT AT BEDTIME 03/25/19   Lesleigh Noe, MD    Family History Family History  Problem Relation Age of Onset  . Alcohol abuse Mother   . Lung cancer Mother   . Esophageal cancer Mother   . Alcohol abuse Father   . Arthritis Father   . Kidney disease Father   . Arthritis Sister   . Diabetes Sister   . Arthritis Brother   . Hypertension Brother   . Sickle cell anemia Brother   . HIV Brother   . Heart attack Brother 26       massive MI  . Thyroid disease Brother     Social History Social History   Tobacco Use  . Smoking status: Current Every Day Smoker    Packs/day: 0.75    Years: 42.00    Pack years: 31.50    Types: Cigarettes  . Smokeless tobacco: Never Used  Vaping Use  . Vaping Use: Never used  Substance Use Topics  . Alcohol use: Not Currently    Comment: quit in 1999  . Drug use: Not Currently    Types: Marijuana, Cocaine    Comment: quit in 1996-street drugs  Allergies   Latex   Review of Systems Review of Systems   Physical Exam Triage Vital Signs ED Triage Vitals  Enc Vitals Group     BP 03/13/20 1250 128/86     Pulse Rate 03/13/20 1250 97     Resp 03/13/20 1250 18     Temp 03/13/20 1250 98.6 F (37 C)     Temp Source 03/13/20 1250 Oral     SpO2 03/13/20 1250 95 %     Weight 03/13/20 1251 215 lb (97.5 kg)     Height 03/13/20 1251 5' 3.75" (1.619 m)     Head Circumference --      Peak Flow --      Pain Score 03/13/20 1251 0     Pain Loc --      Pain Edu? --      Excl. in [place]? --    No data found.  Updated Vital Signs BP 128/86   Pulse 97   Temp 98.6 F (37 C) (Oral)   Resp 18   Ht 5' 3.75" (1.619 m)   Wt 215 lb (97.5 kg)   SpO2 95%   BMI 37.19 kg/m   Visual Acuity Right Eye Distance:   Left Eye Distance:   Bilateral Distance:    Right Eye Near:   Left Eye Near:    Bilateral Near:     Physical Exam Vitals and nursing note reviewed.   Constitutional:      General: She is not in acute distress.    Appearance: Normal appearance. She is not ill-appearing, toxic-appearing or diaphoretic.  HENT:     Head: Normocephalic.     Nose: Nose normal.  Eyes:     Conjunctiva/sclera: Conjunctivae normal.  Pulmonary:     Effort: Pulmonary effort is normal.  Musculoskeletal:        General: Normal range of motion.     Cervical back: Normal range of motion.  Skin:    General: Skin is warm and dry.     Findings: No rash.  Neurological:     Mental Status: She is alert.  Psychiatric:        Mood and Affect: Mood normal.      UC Treatments / Results  Labs (all labs ordered are listed, but only abnormal results are displayed) Labs Reviewed  CERVICOVAGINAL ANCILLARY ONLY    EKG   Radiology CT CHEST LUNG CANCER SCREENING LOW DOSE WO CONTRAST  Result Date: 03/11/2020 CLINICAL DATA:  59 year old female current smoker, with 32 pack-year history of smoking, for follow-up lung cancer screening. History of left breast cancer, status post mastectomy in 2019, with chemotherapy and radiation. Remote history of right breast cancer, status post lumpectomy in 2001, with chemotherapy and radiation. EXAM: CT CHEST WITHOUT CONTRAST LOW-DOSE FOR LUNG CANCER SCREENING TECHNIQUE: Multidetector CT imaging of the chest was performed following the standard protocol without IV contrast. COMPARISON:  Low-dose lung cancer screening CT chest dated 03/12/2019 FINDINGS: Cardiovascular: Heart is normal in size.  No pericardial effusion. No evidence of thoracic aortic aneurysm. Atherosclerotic calcifications of the aortic arch. Right chest port terminates at the cavoatrial junction. Mediastinum/Nodes: No suspicious mediastinal lymphadenopathy. Status post bilateral axillary lymph node dissection. Visualized thyroid is unremarkable. Lungs/Pleura: Mild centrilobular emphysematous changes, upper lung predominant. Mild lingular scarring/atelectasis.  No focal  consolidation. Mild radiation changes in the anterior right upper lobe. No suspicious pulmonary nodules. No pleural effusion or pneumothorax. Upper Abdomen: Visualized upper abdomen is grossly unremarkable, noting vascular calcifications. Musculoskeletal: Status  post left mastectomy. Status post right breast lumpectomy. Mild degenerative changes of the visualized thoracolumbar spine. IMPRESSION: Lung-RADS 1, negative. Continue annual screening with low-dose chest CT without contrast in 12 months. Aortic Atherosclerosis (ICD10-I70.0) and Emphysema (ICD10-J43.9). Electronically Signed   By: Julian Hy M.D.   On: 03/11/2020 15:30    Procedures Procedures (including critical care time)  Medications Ordered in UC Medications - No data to display  Initial Impression / Assessment and Plan / UC Course  I have reviewed the triage vital signs and the nursing notes.  Pertinent labs & imaging results that were available during my care of the patient were reviewed by me and considered in my medical decision making (see chart for details).     Vaginal discharge Swab sent for testing. Will treat for BV and yeast pending culture results. Follow up as needed for continued or worsening symptoms  Final Clinical Impressions(s) / UC Diagnoses   Final diagnoses:  Vaginal discharge     Discharge Instructions     Treating for possible yeast and bacteria infection  Medicines as prescribed Swab sent for testing.     ED Prescriptions    Medication Sig Dispense Auth. Provider   metroNIDAZOLE (FLAGYL) 500 MG tablet Take 1 tablet (500 mg total) by mouth 2 (two) times daily. 14 tablet Mervil Wacker A, NP   fluconazole (DIFLUCAN) 150 MG tablet Take 1 tablet (150 mg total) by mouth daily. 2 tablet Loura Halt A, NP     PDMP not reviewed this encounter.   Orvan July, NP 03/13/20 1317

## 2020-03-13 NOTE — ED Triage Notes (Signed)
Pt reports having vaginal discharge and odor x3 weeks. sts she have used some otc products with minimal relief.

## 2020-03-13 NOTE — Discharge Instructions (Signed)
Treating for possible yeast and bacteria infection  Medicines as prescribed Swab sent for testing.

## 2020-03-13 NOTE — Telephone Encounter (Signed)
Agree with evaluation. May be BV and needs testing.

## 2020-03-13 NOTE — Telephone Encounter (Signed)
Pt said she had not been sexually active for over 1 year. And recently had sex x 2 with a gentleman; pt developed vaginal discharge with external itching; pt used monistat 7 and after 7 days symptoms went away. Pt has had sex with same gentleman again and now symptoms have returned; yellowish white vaginal discharge, slight odor,perineal itching. Now pt is concerned and wants to be seen. No available appts at [facility] today and pt does not want to wait until next wk and pt will go to [facility] in [place] to be eval and tested. FYI to Dr Einar Pheasant.

## 2020-03-16 LAB — CERVICOVAGINAL ANCILLARY ONLY
Bacterial Vaginitis (gardnerella): POSITIVE — AB
Candida Glabrata: NEGATIVE
Candida Vaginitis: NEGATIVE
Chlamydia: NEGATIVE
Comment: NEGATIVE
Comment: NEGATIVE
Comment: NEGATIVE
Comment: NEGATIVE
Comment: NEGATIVE
Comment: NORMAL
Neisseria Gonorrhea: NEGATIVE
Trichomonas: POSITIVE — AB

## 2020-03-23 ENCOUNTER — Other Ambulatory Visit: Payer: Self-pay

## 2020-03-23 ENCOUNTER — Encounter: Payer: Self-pay | Admitting: Family Medicine

## 2020-03-23 ENCOUNTER — Ambulatory Visit (INDEPENDENT_AMBULATORY_CARE_PROVIDER_SITE_OTHER): Payer: Medicare Other | Admitting: Family Medicine

## 2020-03-23 VITALS — BP 122/78 | HR 75 | Temp 97.0°F | Wt 234.0 lb

## 2020-03-23 DIAGNOSIS — J439 Emphysema, unspecified: Secondary | ICD-10-CM

## 2020-03-23 DIAGNOSIS — Z113 Encounter for screening for infections with a predominantly sexual mode of transmission: Secondary | ICD-10-CM

## 2020-03-23 DIAGNOSIS — Z139 Encounter for screening, unspecified: Secondary | ICD-10-CM

## 2020-03-23 DIAGNOSIS — I700 Atherosclerosis of aorta: Secondary | ICD-10-CM

## 2020-03-23 DIAGNOSIS — Z720 Tobacco use: Secondary | ICD-10-CM

## 2020-03-23 DIAGNOSIS — Z1159 Encounter for screening for other viral diseases: Secondary | ICD-10-CM

## 2020-03-23 DIAGNOSIS — Z114 Encounter for screening for human immunodeficiency virus [HIV]: Secondary | ICD-10-CM | POA: Diagnosis not present

## 2020-03-23 DIAGNOSIS — A599 Trichomoniasis, unspecified: Secondary | ICD-10-CM

## 2020-03-23 HISTORY — DX: Trichomoniasis, unspecified: A59.9

## 2020-03-23 LAB — COMPREHENSIVE METABOLIC PANEL
ALT: 8 U/L (ref 0–35)
AST: 13 U/L (ref 0–37)
Albumin: 4.2 g/dL (ref 3.5–5.2)
Alkaline Phosphatase: 102 U/L (ref 39–117)
BUN: 10 mg/dL (ref 6–23)
CO2: 29 mEq/L (ref 19–32)
Calcium: 9.7 mg/dL (ref 8.4–10.5)
Chloride: 104 mEq/L (ref 96–112)
Creatinine, Ser: 0.69 mg/dL (ref 0.40–1.20)
GFR: 95.44 mL/min (ref >60.00)
Glucose, Bld: 100 mg/dL — ABNORMAL HIGH (ref 70–99)
Potassium: 4.4 mEq/L (ref 3.5–5.1)
Sodium: 139 mEq/L (ref 135–145)
Total Bilirubin: 0.5 mg/dL (ref 0.2–1.2)
Total Protein: 7.2 g/dL (ref 6.0–8.3)

## 2020-03-23 LAB — LIPID PANEL
Cholesterol: 206 mg/dL — ABNORMAL HIGH (ref 0–200)
HDL: 65.5 mg/dL (ref >39.00)
LDL Cholesterol: 118 mg/dL — ABNORMAL HIGH (ref 0–99)
NonHDL: 140.41
Total CHOL/HDL Ratio: 3
Triglycerides: 111 mg/dL (ref 0.0–149.0)
VLDL: 22.2 mg/dL (ref 0.0–40.0)

## 2020-03-23 LAB — CBC
HCT: 40.3 % (ref 36.0–46.0)
Hemoglobin: 13.3 g/dL (ref 12.0–15.0)
MCHC: 33.1 g/dL (ref 30.0–36.0)
MCV: 86.6 fl (ref 78.0–100.0)
Platelets: 305 10*3/uL (ref 150.0–400.0)
RBC: 4.65 Mil/uL (ref 3.87–5.11)
RDW: 14.2 % (ref 11.5–15.5)
WBC: 4.5 10*3/uL (ref 4.0–10.5)

## 2020-03-23 MED ORDER — ATORVASTATIN CALCIUM 10 MG PO TABS: 10.0000 mg | ORAL_TABLET | Freq: Every day | ORAL | 3 refills | Status: DC

## 2020-03-23 NOTE — Assessment & Plan Note (Signed)
Discussed starting lipitor 10 mg. Check lipids, cont asa 81 mg. Encouraged continued smoking cessation efforts

## 2020-03-23 NOTE — Assessment & Plan Note (Signed)
Encouraged continued cessation efforts. She is aware of the risks of vaping and working on reducing use.

## 2020-03-23 NOTE — Assessment & Plan Note (Signed)
Encouraged her to make sure her partner is treated. Test for cure performed today. GYN referral for continued discussion - as she is unclear why this occurred as one partner and had been ~6 months since either were sexually active. Symptoms resolved.

## 2020-03-23 NOTE — Progress Notes (Signed)
Subjective:     Jaime [name] is a 59 y.o. female presenting for ER followup and Referral (OB/GYN)     HPI  #BV/Trichimonas - no longer having symptoms  - 12/17 - completed 7 day course of metrinidazole  #emphysema - gets tired with exercise - can hear her self breathing when she is laying down - doe on with 1/2 block - depending on speed   #tobacco use - 1/2 ppd or less  - is replacing with vaping   Review of Systems   Social History   Tobacco Use  Smoking Status Current Every Day Smoker  . Packs/day: 0.75  . Years: 42.00  . Pack years: 31.50  . Types: Cigarettes  Smokeless Tobacco Never Used        Objective:    BP Readings from Last 3 Encounters:  03/23/20 122/78  03/13/20 128/86  03/06/19 (!) 148/82   Wt Readings from Last 3 Encounters:  03/23/20 234 lb (106.1 kg)  03/13/20 215 lb (97.5 kg)  03/11/20 215 lb (97.5 kg)    BP 122/78   Pulse 75   Temp (!) 97 F (36.1 C) (Temporal)   Wt 234 lb (106.1 kg)   SpO2 99%   BMI 40.48 kg/m    Physical Exam Constitutional:      General: She is not in acute distress.    Appearance: She is well-developed. She is not diaphoretic.  HENT:     Right Ear: External ear normal.     Left Ear: External ear normal.  Eyes:     Conjunctiva/sclera: Conjunctivae normal.  Cardiovascular:     Rate and Rhythm: Normal rate and regular rhythm.     Heart sounds: No murmur heard.   Pulmonary:     Effort: Pulmonary effort is normal.     Breath sounds: Normal breath sounds.  Musculoskeletal:     Cervical back: Neck supple.  Skin:    General: Skin is warm and dry.     Capillary Refill: Capillary refill takes less than 2 seconds.  Neurological:     Mental Status: She is alert. Mental status is at baseline.  Psychiatric:        Mood and Affect: Mood normal.        Behavior: Behavior normal.           Assessment & Plan:   Problem List Items Addressed This Visit      Cardiovascular and Mediastinum    Aortic atherosclerosis (HCC)    Discussed starting lipitor 10 mg. Check lipids, cont asa 81 mg. Encouraged continued smoking cessation efforts      Relevant Medications   atorvastatin (LIPITOR) 10 MG tablet   Other Relevant Orders   Lipid panel   Comprehensive metabolic panel     Respiratory   Emphysema lung (HCC) - Primary    Clear lungs. Pt endorses occasional DOE, declined albuterol trial.       Relevant Orders   Comprehensive metabolic panel     Other   Tobacco use    Encouraged continued cessation efforts. She is aware of the risks of vaping and working on reducing use.       Relevant Orders   CBC   Trichomonosis    Encouraged her to make sure her partner is treated. Test for cure performed today. GYN referral for continued discussion - as she is unclear why this occurred as one partner and had been ~6 months since either were sexually active. Symptoms resolved.  Relevant Orders   WET PREP BY MOLECULAR PROBE    Other Visit Diagnoses    Need for hepatitis C screening test       Relevant Orders   Hepatitis C antibody   Encounter for screening for HIV       Relevant Orders   HIV Antibody (routine testing w rflx)   Screening due       Relevant Orders   Ambulatory referral to Gynecology   Routine screening for STI (sexually transmitted infection)       Relevant Orders   RPR       No follow-ups on file.  Lynnda Child, MD  This visit occurred during the SARS-CoV-2 public health emergency.  Safety protocols were in place, including screening questions prior to the visit, additional usage of staff PPE, and extensive cleaning of exam room while observing appropriate contact time as indicated for disinfecting solutions.

## 2020-03-23 NOTE — Patient Instructions (Addendum)
#   Aortic Atherosclerosis - start statin medication - continue aspirin  #Breathing - if breathing difficulty continues -- let me know - continue to work to reduce tobacco

## 2020-03-23 NOTE — Assessment & Plan Note (Signed)
Clear lungs. Pt endorses occasional DOE, declined albuterol trial.

## 2020-03-24 LAB — WET PREP BY MOLECULAR PROBE
Candida species: NOT DETECTED
Gardnerella vaginalis: NOT DETECTED
MICRO NUMBER:: 11358270
SPECIMEN QUALITY:: ADEQUATE
Trichomonas vaginosis: NOT DETECTED

## 2020-03-24 LAB — RPR: RPR Ser Ql: NONREACTIVE

## 2020-03-24 LAB — HEPATITIS C ANTIBODY
Hepatitis C Ab: NONREACTIVE
SIGNAL TO CUT-OFF: 0.01 (ref <1.00)

## 2020-03-24 LAB — HIV ANTIBODY (ROUTINE TESTING W REFLEX): HIV 1&2 Ab, 4th Generation: NONREACTIVE

## 2020-04-10 ENCOUNTER — Encounter: Payer: Medicare Other | Admitting: Certified Nurse Midwife

## 2020-08-27 ENCOUNTER — Telehealth: Payer: Self-pay

## 2020-08-27 NOTE — Telephone Encounter (Signed)
Patient called and stated that for the past week she has had a "head cold" and is losing her voice. Patient reports having cough, nasal and chest congestion. Patient denies SOB, chest pain, fever, chills, N/V/D, loss of taste or smell. Patient stated that she took an at home COVID test and it was negative. Patient has been taking Dayquil, Nyquil and began taking Mucinex today. Instructed patient that the appointment would have to be a virtual visit D/T her symptoms and we do not have any openings until Monday. Patient stated that she would call back in the morning to see if there were any available appointments. Instructed patient that if she developed new or worsening symptoms she should be seen at [facility]. Also, educated patient about staying hydrated, rest, and warm tea with honey. Patient verbalized understanding.

## 2020-08-27 NOTE — Telephone Encounter (Signed)
Noted, agree with evaluation 

## 2020-08-28 NOTE — Telephone Encounter (Signed)
Spoke with patient this morning who stated that she is feeling much better after taking Mucinex and her voice seems to be coming back. Instructed patient that if her symptoms worsen to let us know. Patient verbalized understanding.

## 2020-10-11 IMAGING — CT CT CHEST LUNG CANCER SCREENING LOW DOSE W/O CM
2 of 5 series · 15 of 40 positions shown, 18 images · non-contrast
Comparison: None.

CLINICAL DATA: 58-year-old asymptomatic female current smoker with
31.5 pack-year smoking history. History of left mastectomy for
breast cancer in 5863.

EXAM:
CT CHEST WITHOUT CONTRAST LOW-DOSE FOR LUNG CANCER SCREENING
TECHNIQUE: Multidetector CT imaging of the chest was performed following the
standard protocol without IV contrast.

[Series 3: lung 1.00 · axial · 0.67mm/px · z∈[-1218,-921]mm · 12 of 329 slices shown, 15 images]
[im 16/329  mediastinal]
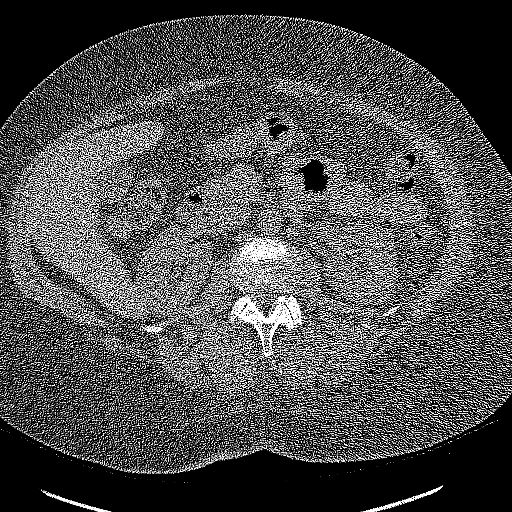
[im 16/329  lung]
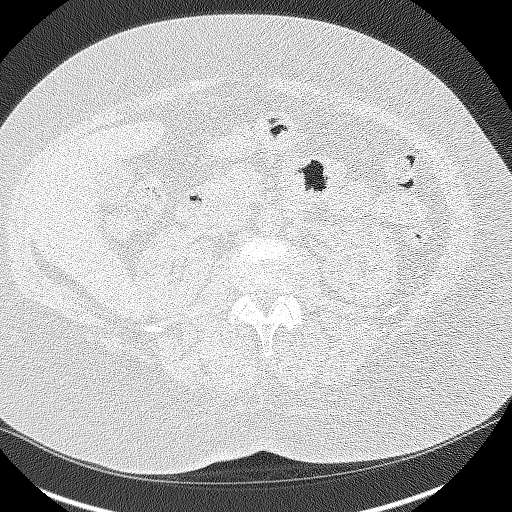
[im 47/329  lung]
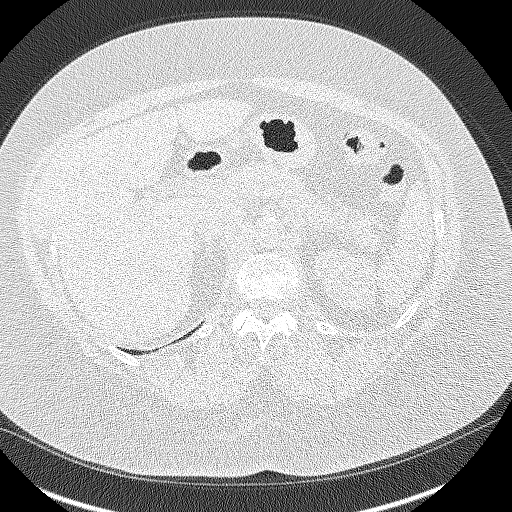
[im 79/329  lung]
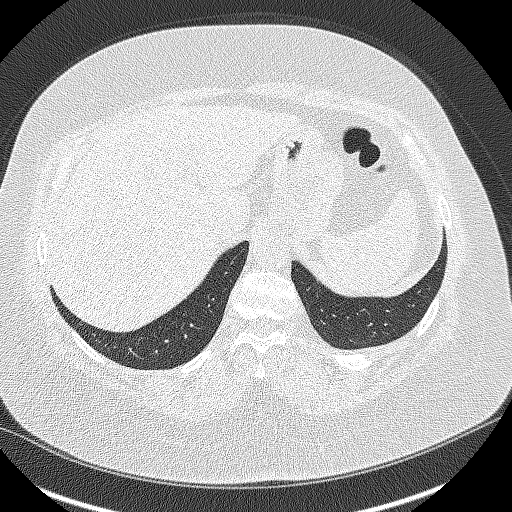
[im 94/329  lung]
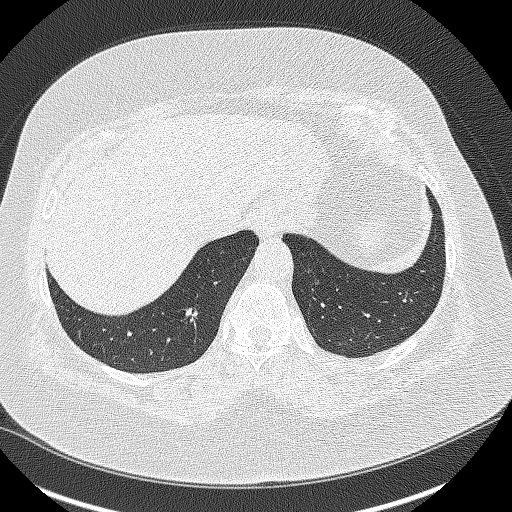
[im 125/329  mediastinal]
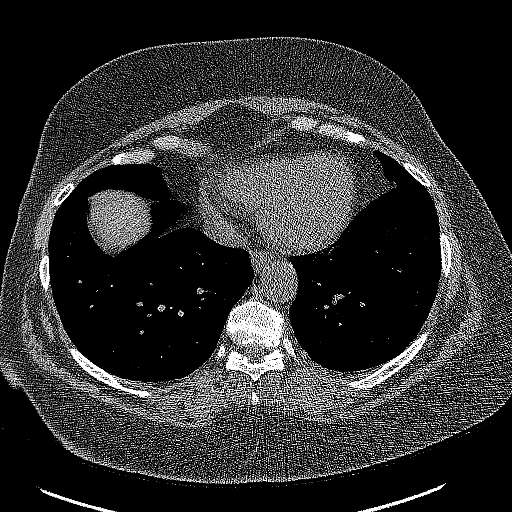
[im 125/329  lung]
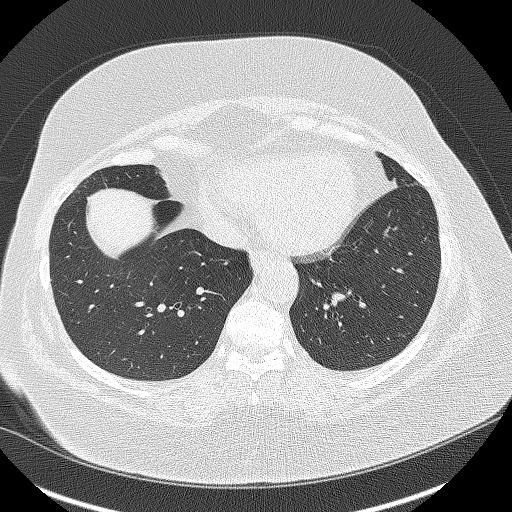
[im 157/329  lung]
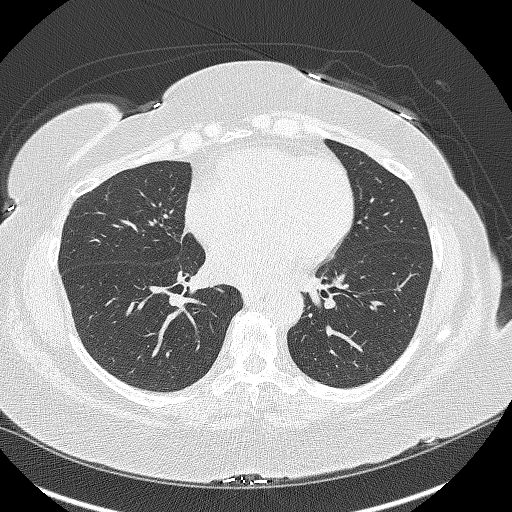
[im 172/329  lung]
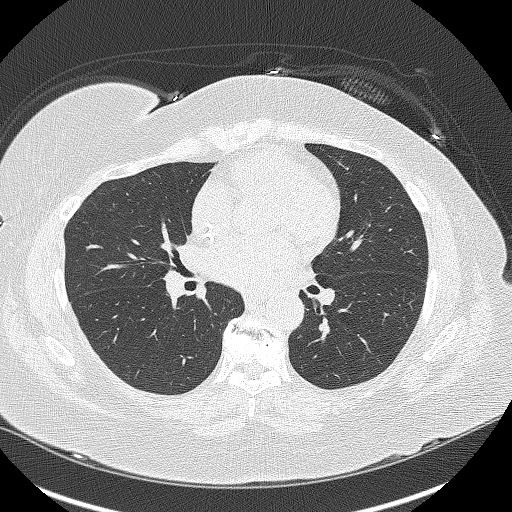
[im 204/329  lung]
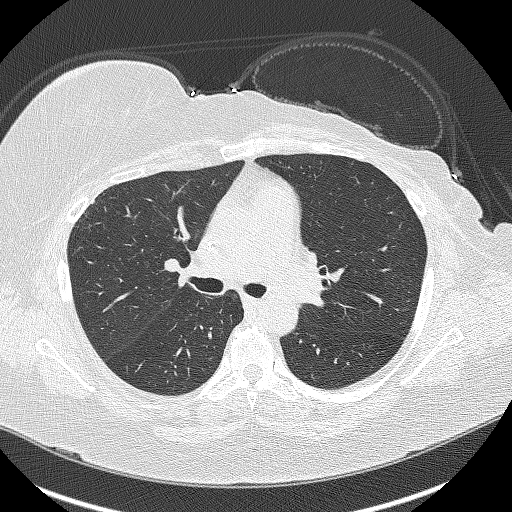
[im 235/329  mediastinal]
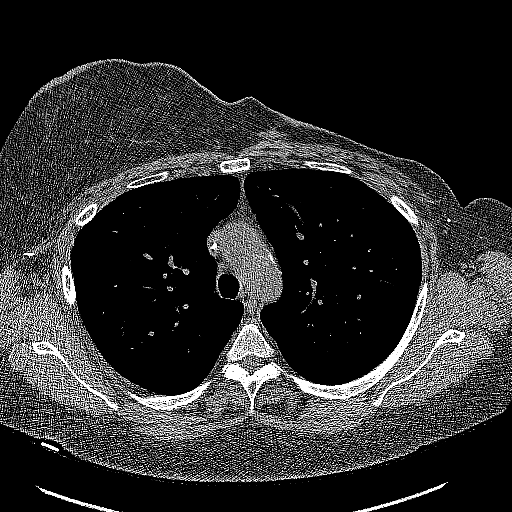
[im 235/329  lung]
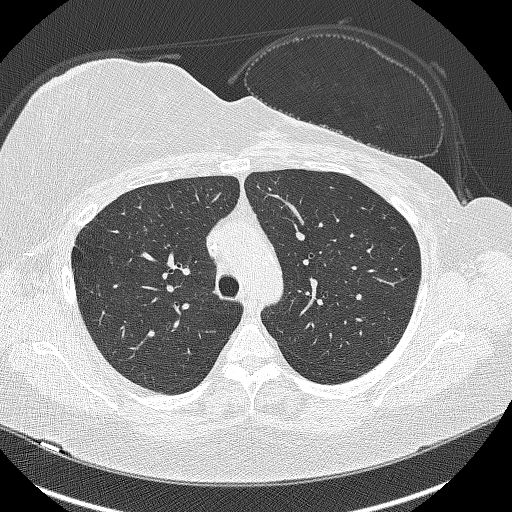
[im 250/329  lung]
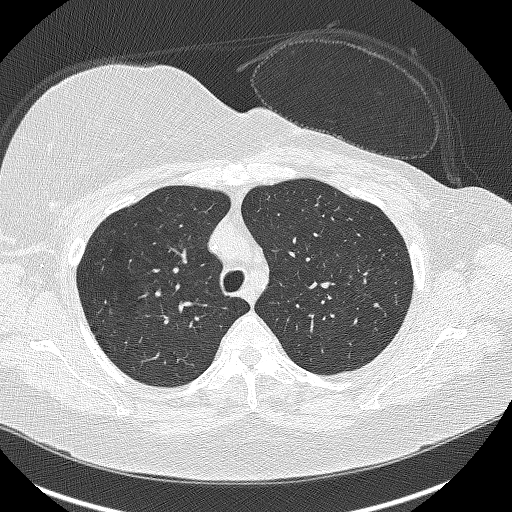
[im 282/329  lung]
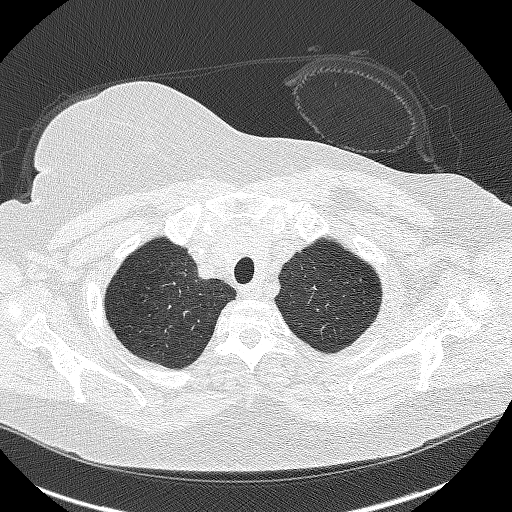
[im 313/329  lung]
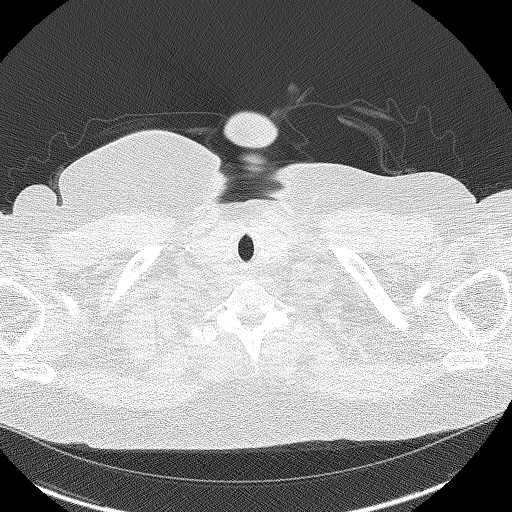

[Series 4: coronals lung 1.00 cor · coronal · 0.64mm/px · 3 of 316 slices shown]
[im 64/316  lung]
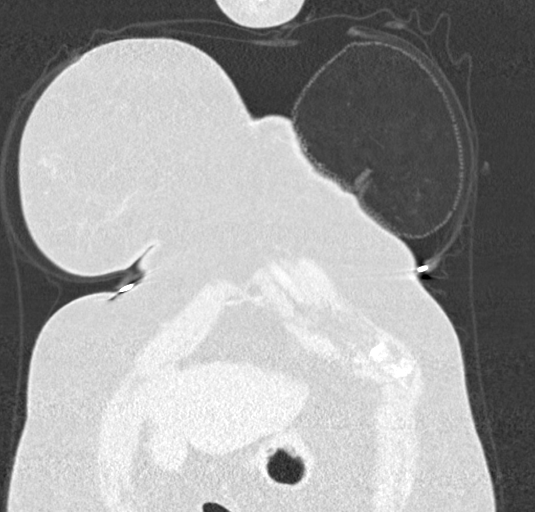
[im 127/316  lung]
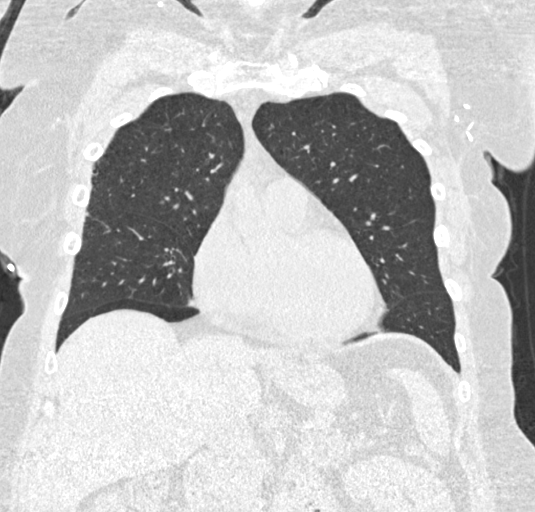
[im 190/316  lung]
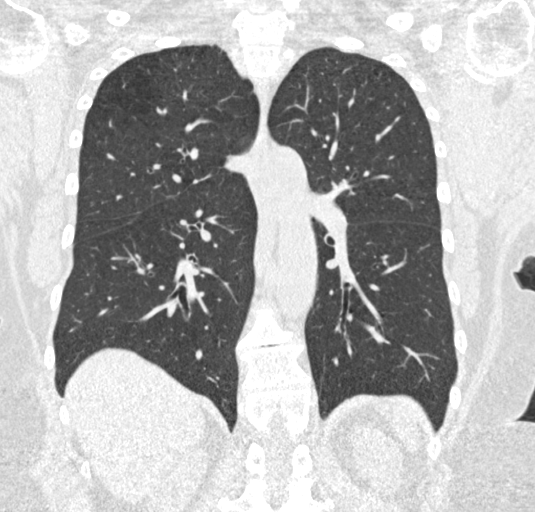

[15 of 40 positions shown; findings below may reference images not displayed]

FINDINGS: Cardiovascular: Normal heart size. No significant pericardial
effusion/thickening. Right internal jugular Port-A-Cath terminates
at the cavoatrial junction. Atherosclerotic nonaneurysmal abdominal
aorta. Normal caliber pulmonary arteries.

Mediastinum/Nodes: No discrete thyroid nodules. Unremarkable
esophagus. Bilateral axillary surgical clips. No pathologically
enlarged axillary, mediastinal or discrete hilar nodes on this
noncontrast scan.

Lungs/Pleura: No pneumothorax. No pleural effusion. Mild
centrilobular and paraseptal emphysema. No acute consolidative
airspace disease or lung masses. No significant pulmonary nodules.

Upper abdomen: No acute abnormality.

Musculoskeletal: No aggressive appearing focal osseous lesions. Left
mastectomy. Moderate thoracic spondylosis.
IMPRESSION: Lung-RADS 1, negative. Continue annual screening with low-dose chest
CT without contrast in 12 months.

Aortic Atherosclerosis (BLREN-117.7) and Emphysema (BLREN-5HU.2).

## 2021-03-24 ENCOUNTER — Encounter: Payer: Medicare Other | Admitting: Family Medicine

## 2021-04-19 ENCOUNTER — Encounter: Payer: Self-pay | Admitting: Family Medicine

## 2021-05-05 ENCOUNTER — Encounter: Payer: Medicare Other | Admitting: Family Medicine

## 2021-06-09 ENCOUNTER — Encounter: Payer: Self-pay | Admitting: Family Medicine

## 2021-06-09 ENCOUNTER — Ambulatory Visit (INDEPENDENT_AMBULATORY_CARE_PROVIDER_SITE_OTHER): Payer: Medicare Other | Admitting: Family Medicine

## 2021-06-09 ENCOUNTER — Other Ambulatory Visit: Payer: Self-pay

## 2021-06-09 ENCOUNTER — Other Ambulatory Visit (HOSPITAL_COMMUNITY)
Admission: RE | Admit: 2021-06-09 | Discharge: 2021-06-09 | Disposition: A | Discharge status: Home / Self Care | Payer: Medicare Other | Source: Ambulatory Visit | Attending: Family Medicine | Admitting: Family Medicine

## 2021-06-09 VITALS — BP 128/70 | HR 75 | Temp 98.0°F | Ht 62.5 in | Wt 228.0 lb

## 2021-06-09 DIAGNOSIS — Z853 Personal history of malignant neoplasm of breast: Secondary | ICD-10-CM

## 2021-06-09 DIAGNOSIS — Z9012 Acquired absence of left breast and nipple: Secondary | ICD-10-CM | POA: Diagnosis not present

## 2021-06-09 DIAGNOSIS — Z1151 Encounter for screening for human papillomavirus (HPV): Secondary | ICD-10-CM | POA: Diagnosis not present

## 2021-06-09 DIAGNOSIS — Z0000 Encounter for general adult medical examination without abnormal findings: Secondary | ICD-10-CM | POA: Diagnosis not present

## 2021-06-09 DIAGNOSIS — Z01419 Encounter for gynecological examination (general) (routine) without abnormal findings: Secondary | ICD-10-CM | POA: Insufficient documentation

## 2021-06-09 DIAGNOSIS — Z124 Encounter for screening for malignant neoplasm of cervix: Secondary | ICD-10-CM

## 2021-06-09 DIAGNOSIS — I700 Atherosclerosis of aorta: Secondary | ICD-10-CM

## 2021-06-09 DIAGNOSIS — Z1211 Encounter for screening for malignant neoplasm of colon: Secondary | ICD-10-CM

## 2021-06-09 DIAGNOSIS — Z720 Tobacco use: Secondary | ICD-10-CM

## 2021-06-09 LAB — COMPREHENSIVE METABOLIC PANEL
ALT: 8 U/L (ref 0–35)
AST: 14 U/L (ref 0–37)
Albumin: 4.1 g/dL (ref 3.5–5.2)
Alkaline Phosphatase: 102 U/L (ref 39–117)
BUN: 15 mg/dL (ref 6–23)
CO2: 30 mEq/L (ref 19–32)
Calcium: 9.5 mg/dL (ref 8.4–10.5)
Chloride: 106 mEq/L (ref 96–112)
Creatinine, Ser: 0.76 mg/dL (ref 0.40–1.20)
GFR: 85.44 mL/min (ref >60.00)
Glucose, Bld: 88 mg/dL (ref 70–99)
Potassium: 4.3 mEq/L (ref 3.5–5.1)
Sodium: 140 mEq/L (ref 135–145)
Total Bilirubin: 0.7 mg/dL (ref 0.2–1.2)
Total Protein: 6.7 g/dL (ref 6.0–8.3)

## 2021-06-09 LAB — LIPID PANEL
Cholesterol: 198 mg/dL (ref 0–200)
HDL: 74.1 mg/dL (ref >39.00)
LDL Cholesterol: 112 mg/dL — ABNORMAL HIGH (ref 0–99)
NonHDL: 124.38
Total CHOL/HDL Ratio: 3
Triglycerides: 64 mg/dL (ref 0.0–149.0)
VLDL: 12.8 mg/dL (ref 0.0–40.0)

## 2021-06-09 NOTE — Patient Instructions (Signed)
You can call for a mammogram at these locations: ? ?[place] [facility].  ?[place] ?[phone] ? ? ? ?

## 2021-06-09 NOTE — Progress Notes (Signed)
Annual Exam  ? ? ?Subjective:  ? Jaime [name] is a 61 y.o. female who presents for Medicare Annual (Subsequent) preventive examination. ? ?Review of Systems    ?Review of Systems  ?Constitutional:  Negative for chills and fever.  ?HENT:  Negative for congestion and sore throat.   ?Eyes:  Negative for blurred vision and double vision.  ?Respiratory:  Negative for shortness of breath.   ?Cardiovascular:  Negative for chest pain.  ?Gastrointestinal:  Negative for heartburn, nausea and vomiting.  ?Genitourinary: Negative.   ?Musculoskeletal: Negative.  Negative for myalgias.  ?Skin:  Negative for rash.  ?Neurological:  Negative for dizziness and headaches.  ?Endo/Heme/Allergies:  Does not bruise/bleed easily.  ?Psychiatric/Behavioral:  Negative for depression. The patient is not nervous/anxious.   ? ?  ?Nutrition ?She does get adequate calcium and Vitamin D in her diet. ?Diet: veggies, cheese, could be better ?Exercise: walking occasionally  ? ? ? ?Social History  ? ?Tobacco Use  ?Smoking Status Every Day  ? Packs/day: 0.75  ? Years: 42.00  ? Pack years: 31.50  ? Types: Cigarettes  ?Smokeless Tobacco Never  ? ?Social History  ? ?Substance and Sexual Activity  ?Alcohol Use Not Currently  ? Comment: quit in 1999  ? ?Social History  ? ?Substance and Sexual Activity  ?Drug Use Not Currently  ? Types: Marijuana, Cocaine  ? Comment: quit in 1996-street drugs  ? ? ? ?General Health ?Dentist in the last year: No ?Eye doctor: yes ? ?Safety ?The patient wears seatbelts: yes.     ?The patient feels safe at home and in their relationships: yes. ? ? ?Menstrual:  ?Symptoms of menopause: none ? ? ?GYN ?She is single partner, contraception - post menopausal status.  ? ? ?Cervical Cancer Screening (21-65):   ?Last Pap: does not remember ?No hx of abnormal pap ? ?Breast Cancer Screening (Age 23-74):  ?There is no FH of breast cancer. There is no FH of ovarian cancer. BRCA screening Not Indicated.  ?Last Mammogram: 04/2019 ?The  patient does want a mammogram this year.  ? ? ?Colon Cancer Screening:  ?Age 59-75 yo - benefits outweigh the risk. Adults 47-85 yo who have never been screened benefit.  ?Benefits: 134000 people in 2016 will be diagnosed and 49,000 will die - early detection helps ?Harms: Complications 2/2 to colonoscopy ?High Risk (Colonoscopy): genetic disorder (Lynch syndrome or familial adenomatous polyposis), personal hx of IBD, previous adenomatous polyp, or previous colorectal cancer, FamHx start 10 years before the age at diagnosis, increased in [name] and black race ? ?Options:  ?FIT - looks for hemoglobin (blood in the stool) - specific and fairly sensitive - must be done annually ?Cologuard - looks for DNA and blood - more sensitive - therefore can have more false positives, every 3 years ?Colonoscopy - every 10 years if normal - sedation, bowl prep, must have someone drive you ? ?Shared decision making and the patient had decided to do FOBT. ?   ?Objective:  ?  ?Today's Vitals  ? 06/09/21 0903  ?BP: 128/70  ?Pulse: 75  ?Temp: 98 ?F (36.7 ?C)  ?TempSrc: Oral  ?SpO2: 98%  ?Weight: 228 lb (103.4 kg)  ?Height: 5' 2.5" (1.588 m)  ? ?Body mass index is 41.04 kg/m?. ? ?Advanced Directives 06/09/2021 03/06/2019  ?Does Patient Have a Medical Advance Directive? No Yes  ?Type of Advance Directive - Healthcare Power of Attorney  ?Would patient like information on creating a medical advance directive? No - Patient declined -  ? ? ?  Current Medications (verified) ?Outpatient Encounter Medications as of 06/09/2021  ?Medication Sig  ? ASPIRIN 81 PO Take 1 tablet by mouth daily.  ? latanoprost (XALATAN) 0.005 % ophthalmic solution SMARTSIG:In Eye(s)  ? [DISCONTINUED] atorvastatin (LIPITOR) 10 MG tablet Take 1 tablet (10 mg total) by mouth daily.  ? ?No facility-administered encounter medications on file as of 06/09/2021.  ? ? ?Allergies (verified) ?Latex  ? ?History: ?Past Medical History:  ?Diagnosis Date  ? Breast cancer ([facility])   ? BIL   mastectomy left    lumpectomy right  ? Glaucoma   ? Heart murmur   ? History of alcohol abuse   ? History of drug abuse ([facility])   ? Personal history of chemotherapy   ? Personal history of radiation therapy   ? ?Past Surgical History:  ?Procedure Laterality Date  ? BREAST BIOPSY    ? BREAST LUMPECTOMY Right 2004  ? triple Neg breast CA  ? MASTECTOMY Left 08/2017  ? TUBAL LIGATION  1991  ? ?Family History  ?Problem Relation Age of Onset  ? Alcohol abuse Mother   ? Lung cancer Mother   ? Esophageal cancer Mother   ? Alcohol abuse Father   ? Arthritis Father   ? Kidney disease Father   ? Arthritis Sister   ? Diabetes Sister   ? Arthritis Brother   ? Hypertension Brother   ? Sickle cell anemia Brother   ? HIV Brother   ? Heart attack Brother 67  ?     massive MI  ? Thyroid disease Brother   ? ?Social History  ? ?Socioeconomic History  ? Marital status: Married  ?  Spouse name: Not on file  ? Number of children: 2  ? Years of education: associates  ? Highest education level: Not on file  ?Occupational History  ? Not on file  ?Tobacco Use  ? Smoking status: Every Day  ?  Packs/day: 0.75  ?  Years: 42.00  ?  Pack years: 31.50  ?  Types: Cigarettes  ? Smokeless tobacco: Never  ?Vaping Use  ? Vaping Use: Never used  ?Substance and Sexual Activity  ? Alcohol use: Not Currently  ?  Comment: quit in 1999  ? Drug use: Not Currently  ?  Types: Marijuana, Cocaine  ?  Comment: quit in 1996-street drugs  ? Sexual activity: Not Currently  ?Other Topics Concern  ? Not on file  ?Social History Narrative  ? 02/13/19  ? From: Born in [place] but grew up in [place]  ? Living: with daughter (separating from her husband)  ? Work: currently on disability  ?   ? Family: Daughter - Jaime [name], Son Jaime [name], has one step son in [place]  ?   ? Enjoys: walking, picnics, bowling, reading, playing cards  ?   ? Exercise: walking - tries to do everyday - uses fitbit and tries to get 5000 steps  ? Diet: veggies, water, 2-3 cups of coffee, trying to  monitor what she eats  ?   ? Safety  ? Seat belts: Yes   ? Guns: No  ? Safe in relationships: Yes   ? ?Social Determinants of Health  ? ?Financial Resource Strain: Not on file  ?Food Insecurity: Not on file  ?Transportation Needs: Not on file  ?Physical Activity: Not on file  ?Stress: Not on file  ?Social Connections: Not on file  ? ? ?Tobacco Counseling ?Ready to quit: Not Answered ?Counseling given: Not Answered ? ? ?Clinical Intake: ? ?Pre-visit  preparation completed: No ? ?Pain : No/denies pain ? ?  ? ?BMI - recorded: 41.04 ?Nutritional Status: BMI > 30  Obese ?Nutritional Risks: None ?Diabetes: No ? ?How often do you need to have someone help you when you read instructions, pamphlets, or other written materials from your doctor or pharmacy?: 1 - Never ? ?Diabetic?no ? ?Interpreter Needed?: No ? ?  ? ? ?Activities of Daily Living ?No flowsheet data found. ? ?Patient Care Team: ?Lesleigh Noe, MD as PCP - General (Family Medicine) ? ?Indicate any recent Medical Services you may have received from other than [facility] providers in the past year (date may be approximate). ? ?   ?Assessment:  ? This is a routine wellness examination for [place]. ? ?Hearing/Vision screen ?Hearing Screening  ? _0  _1  _2  _3  _4   ?Right ear _5 ?Left ear _6 ?Vision Screening - Comments:: Has regular eye exams at [facility] ? ?Dietary issues and exercise activities discussed: ?  ? ? Goals Addressed   ? ?  ?  ?  ?  ? This Visit's Progress  ?  Patient Stated     ?  Get screening updated ?  ? ?  ?Depression Screen ?PHQ 2/9 Scores 06/09/2021 02/13/2019  ?PHQ - 2 Score 0 1  ?  ?Fall Risk ?Fall Risk  06/09/2021  ?Falls in the past year? 0  ?Number falls in past yr: 0  ? ? ?Cognitive Function: ?  ?  ? Declined ? ?Immunizations ?Immunization History  ?Administered Date(s) Administered  ? PFIZER(Purple Top)SARS-COV-2 Vaccination 07/05/2019, 07/29/2019  ? ? ?TDAP status: Due, Education has been provided  regarding the importance of this vaccine. Advised may receive this vaccine at local pharmacy or Health Dept. Aware to provide a copy of the vaccination record if obtained from local pharmacy or Health Dept. Verbalized acc

## 2021-06-10 LAB — CYTOLOGY - PAP
Adequacy: ABSENT
Comment: NEGATIVE
Diagnosis: NEGATIVE
High risk HPV: NEGATIVE

## 2021-06-11 ENCOUNTER — Telehealth: Payer: Self-pay

## 2021-06-11 DIAGNOSIS — E782 Mixed hyperlipidemia: Secondary | ICD-10-CM

## 2021-06-11 MED ORDER — ATORVASTATIN CALCIUM 10 MG PO TABS: 10.0000 mg | ORAL_TABLET | Freq: Every day | ORAL | 3 refills | Status: DC

## 2021-06-11 NOTE — Telephone Encounter (Signed)
Spoke to pt and asked if she would like to start on Atorvastatin to help lower her cholesterol. Pt states she would like to and please send to [facility] in [place].  ?

## 2021-06-11 NOTE — Telephone Encounter (Signed)
Sent to pharmacy 

## 2021-06-11 NOTE — Telephone Encounter (Signed)
Patient is calling to speak with Jaime [name] about medication for cholesterol. Patient states she received it in [place] but wasn't able to reply. Would you please contact patient. Thank you!

## 2021-12-29 ENCOUNTER — Ambulatory Visit (INDEPENDENT_AMBULATORY_CARE_PROVIDER_SITE_OTHER): Payer: Medicare Other | Admitting: Family

## 2021-12-29 ENCOUNTER — Encounter: Payer: Self-pay | Admitting: Family

## 2021-12-29 ENCOUNTER — Telehealth: Payer: Self-pay | Admitting: Family

## 2021-12-29 VITALS — BP 120/86 | HR 75 | Temp 98.1°F | Resp 18 | Ht 62.5 in | Wt 227.0 lb

## 2021-12-29 DIAGNOSIS — E010 Iodine-deficiency related diffuse (endemic) goiter: Secondary | ICD-10-CM | POA: Diagnosis not present

## 2021-12-29 DIAGNOSIS — Z122 Encounter for screening for malignant neoplasm of respiratory organs: Secondary | ICD-10-CM

## 2021-12-29 DIAGNOSIS — Z87891 Personal history of nicotine dependence: Secondary | ICD-10-CM

## 2021-12-29 DIAGNOSIS — R0609 Other forms of dyspnea: Secondary | ICD-10-CM

## 2021-12-29 DIAGNOSIS — Z1231 Encounter for screening mammogram for malignant neoplasm of breast: Secondary | ICD-10-CM | POA: Diagnosis not present

## 2021-12-29 DIAGNOSIS — R918 Other nonspecific abnormal finding of lung field: Secondary | ICD-10-CM

## 2021-12-29 DIAGNOSIS — Z853 Personal history of malignant neoplasm of breast: Secondary | ICD-10-CM

## 2021-12-29 DIAGNOSIS — Z720 Tobacco use: Secondary | ICD-10-CM

## 2021-12-29 DIAGNOSIS — H4010X3 Unspecified open-angle glaucoma, severe stage: Secondary | ICD-10-CM

## 2021-12-29 HISTORY — DX: Other forms of dyspnea: R06.09

## 2021-12-29 NOTE — Assessment & Plan Note (Signed)
She is following regularly by Dr. Benay Pillow (ophthalmology).

## 2021-12-29 NOTE — Assessment & Plan Note (Signed)
Could be due to weight, deconditioning, but with her smoking hx she is at increased risk for heart disease.  EKG tracing is personally reviewed.  EKG notes NSR.  No acute changes.  Will refer to cardiology for further evaluation and risk stratification.

## 2021-12-29 NOTE — Assessment & Plan Note (Signed)
Still smoking, not interested in quitting smoking. Will obtain follow up lung cancer screening CT.

## 2021-12-29 NOTE — Assessment & Plan Note (Signed)
Will refer back to oncology for ongoing surveillance. Last visit was 2020 and they wanted to see her every 3 months at that time.  Refer for right screening mammogram- overdue.

## 2021-12-29 NOTE — Telephone Encounter (Signed)
See mychart.  

## 2021-12-29 NOTE — Progress Notes (Signed)
 Subjective:     Patient ID: Jaime [name], female    DOB: 04/15/1960, 61 y.o.   MRN: [id number]  Chief Complaint  Patient presents with   New Patient (Initial Visit)    Pt states having stomach and chest spasms. Pt states when she bends over to tie her shoes she will get spasms.     HPI Patient is in today to establish care. She was previously followed by Dr. Jessica Cody at [facility] in [place]. She recently moved out this way and our office is more convenient.  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 Had chemo/radiation/lumptectomy  Breast CA left 2019- 2 diferent types of CA left breast.  grade 3 invasive carcinoma, ER/PR negative. The larger mass was HER-2 positive.  Smaller mass was HER-2 negative. Patient completed 6 cycles of neoadjuvant chemotherapy. Patient underwent left simple mastectomy and a sentinel lymph node biopsy on 09/15/2017. Clinical Stage IIA cT2N0M0 ER-/PR-/HER2+ Clinical Stage 1A cT1N0M0 ER-/PR-/HER2-  Both were ypT1aN0(sn), grade 3.    Hx of tobacco abuse-still smoking cigarettes.   Sees Dr. Bradley King for open angle Glaucoma.    Last oncology visit 2020  She reports "stomach spasms as well as some pain across the left mastectomy incision.  She took some aspirin which helped.    She has gained weight recently and she has some increased sob with exertion.   Wt Readings from Last 3 Encounters:  12/29/21 227 lb (103 kg)  06/09/21 228 lb (103.4 kg)  03/23/20 234 lb (106.1 kg)   SOB has been present with exertion for about 1 year.   Health Maintenance Due  Topic Date Due   TETANUS/TDAP  Never done   COLONOSCOPY (Pts 45-49yrs Insurance coverage will need to be confirmed)  Never done   Zoster Vaccines- Shingrix (1 of 2) Never done   MAMMOGRAM  04/05/2019   COVID-19 Vaccine (3 - Pfizer series) 09/23/2019    Past Medical  History:  Diagnosis Date   Breast cancer ([facility])    BIL  mastectomy left    lumpectomy right   Glaucoma    Heart murmur    History of alcohol abuse    History of drug abuse ([facility])    Personal history of chemotherapy    Personal history of radiation therapy    Trichomonosis 03/23/2020    Past Surgical History:  Procedure Laterality Date   BREAST BIOPSY     BREAST LUMPECTOMY Right 2004   triple Neg breast CA   MASTECTOMY Left 08/2017   TUBAL LIGATION  1991    Family History  Problem Relation Age of Onset   Alcohol abuse Mother    Lung cancer Mother    Esophageal cancer Mother    Alcohol abuse Father    Arthritis Father    Kidney disease Father    Dementia Father    Arthritis Sister    Diabetes Sister    Arthritis Brother    Hypertension Brother    Sickle cell anemia Brother    HIV Brother    Heart attack Brother 55       massive MI   Thyroid disease Brother     Social History   Socioeconomic History   Marital status: Married    Spouse name: Not on file   Number of children: 2   Years of education: associates   Highest education level: Not on file  Occupational   History   Not on file  Tobacco Use   Smoking status: Every Day    Packs/day: 0.75    Years: 42.00    Total pack years: 31.50    Types: Cigarettes   Smokeless tobacco: Never  Vaping Use   Vaping Use: Never used  Substance and Sexual Activity   Alcohol use: Not Currently    Comment: quit in 1999   Drug use: Not Currently    Types: Marijuana, Cocaine    Comment: quit in 1996-street drugs   Sexual activity: Not Currently  Other Topics Concern   Not on file  Social History Narrative   02/13/19   From: Born in [place] but grew up in [place]   Living: with husband   Work: currently on disability      Family: Daughter - Tiara (biological daughter) Son - Derek (biological son) has one step son in [place]      Enjoys: walking, picnics, bowling, reading, playing cards      Exercise: walking -  tries to do everyday - uses fitbit and tries to get 5000 steps   Diet: veggies, water, 2-3 cups of coffee, trying to monitor what she eats      Safety   Seat belts: Yes    Guns: No   Safe in relationships: Yes    Social Determinants of Health   Financial Resource Strain: Medium Risk (02/13/2019)   Overall Financial Resource Strain (CARDIA)    Difficulty of Paying Living Expenses: Somewhat hard  Food Insecurity: Not on file  Transportation Needs: Not on file  Physical Activity: Not on file  Stress: Not on file  Social Connections: Not on file  Intimate Partner Violence: Not on file    Outpatient Medications Prior to Visit  Medication Sig Dispense Refill   ASPIRIN 81 PO Take 1 tablet by mouth daily.     latanoprost (XALATAN) 0.005 % ophthalmic solution SMARTSIG:In Eye(s)     atorvastatin (LIPITOR) 10 MG tablet Take 1 tablet (10 mg total) by mouth daily. (Patient not taking: Reported on 12/29/2021) 90 tablet 3   No facility-administered medications prior to visit.    Allergies  Allergen Reactions   Latex Hives    Allergic reaction to condoms years ago.    ROS    See HPI Objective:    Physical Exam Constitutional:      General: She is not in acute distress.    Appearance: Normal appearance. She is well-developed.  HENT:     Head: Normocephalic and atraumatic.     Right Ear: External ear normal.     Left Ear: External ear normal.  Eyes:     General: No scleral icterus. Neck:     Thyroid: Thyromegaly (right lobe seems enlarged) present.  Cardiovascular:     Rate and Rhythm: Normal rate and regular rhythm.     Heart sounds: Normal heart sounds. No murmur heard. Pulmonary:     Effort: Pulmonary effort is normal. No respiratory distress.     Breath sounds: Normal breath sounds. No wheezing.  Musculoskeletal:     Cervical back: Neck supple.  Skin:    General: Skin is warm and dry.  Neurological:     Mental Status: She is alert and oriented to person, place, and  time.  Psychiatric:        Mood and Affect: Mood normal.        Behavior: Behavior normal.        Thought Content: Thought content normal.          Judgment: Judgment normal.     BP 120/86 (BP Location: Left Arm, Patient Position: Sitting, Cuff Size: Large)   Pulse 75   Temp 98.1 F (36.7 C) (Oral)   Resp 18   Ht 5' 2.5" (1.588 m)   Wt 227 lb (103 kg)   SpO2 98%   BMI 40.86 kg/m  Wt Readings from Last 3 Encounters:  12/29/21 227 lb (103 kg)  06/09/21 228 lb (103.4 kg)  03/23/20 234 lb (106.1 kg)       Assessment & Plan:   Problem List Items Addressed This Visit       Unprioritized   Tobacco use    Still smoking, not interested in quitting smoking. Will obtain follow up lung cancer screening CT.       Open-angle glaucoma    She is following regularly by Dr. Bradley King (ophthalmology).       Lung mass    CT 12/21 negative for mass. Will repeat screening lung CT.       Hx of breast cancer    Will refer back to oncology for ongoing surveillance. Last visit was 2020 and they wanted to see her every 3 months at that time.  Refer for right screening mammogram- overdue.       Relevant Orders   Ambulatory referral to Hematology / Oncology   DOE (dyspnea on exertion)    Could be due to weight, deconditioning, but with her smoking hx she is at increased risk for heart disease.  EKG tracing is personally reviewed.  EKG notes NSR.  No acute changes.  Will refer to cardiology for further evaluation and risk stratification.        Relevant Orders   Ambulatory referral to Cardiology   EKG 12-Lead (Completed)   Other Visit Diagnoses     Tobacco abuse    -  Primary   Relevant Orders   CT CHEST LUNG CANCER SCREENING LOW DOSE WO CONTRAST   Encounter for screening for lung cancer       Relevant Orders   CT CHEST LUNG CANCER SCREENING LOW DOSE WO CONTRAST   Encounter for screening mammogram for malignant neoplasm of breast       Relevant Orders   MM 3D SCREEN BREAST UNI  RIGHT   Thyromegaly       Relevant Orders   US THYROID   Personal history of nicotine dependence       Relevant Orders   CT CHEST LUNG CANCER SCREENING LOW DOSE WO CONTRAST      42 minutes spent on today's visit. Time was spent reviewing patient records in care everywhere, interviewing patient and formulating medical plan.  I am having Camari Karan maintain her ASPIRIN 81 PO, latanoprost, and atorvastatin.  No orders of the defined types were placed in this encounter.  

## 2021-12-29 NOTE — Assessment & Plan Note (Signed)
CT 12/21 negative for mass. Will repeat screening lung CT.

## 2021-12-31 ENCOUNTER — Inpatient Hospital Stay: Payer: Medicare Other | Attending: Family | Admitting: Family

## 2021-12-31 ENCOUNTER — Other Ambulatory Visit: Payer: Self-pay | Admitting: Family

## 2021-12-31 ENCOUNTER — Encounter: Payer: Self-pay | Admitting: Family

## 2021-12-31 ENCOUNTER — Inpatient Hospital Stay: Payer: Medicare Other

## 2021-12-31 VITALS — BP 147/42 | HR 72 | Temp 97.9°F | Resp 18 | Wt 227.0 lb

## 2021-12-31 VITALS — BP 147/42 | HR 72 | Temp 97.9°F | Resp 18

## 2021-12-31 DIAGNOSIS — Z9221 Personal history of antineoplastic chemotherapy: Secondary | ICD-10-CM | POA: Insufficient documentation

## 2021-12-31 DIAGNOSIS — Z90721 Acquired absence of ovaries, unilateral: Secondary | ICD-10-CM | POA: Insufficient documentation

## 2021-12-31 DIAGNOSIS — Z853 Personal history of malignant neoplasm of breast: Secondary | ICD-10-CM

## 2021-12-31 DIAGNOSIS — E559 Vitamin D deficiency, unspecified: Secondary | ICD-10-CM

## 2021-12-31 DIAGNOSIS — Z95828 Presence of other vascular implants and grafts: Secondary | ICD-10-CM

## 2021-12-31 DIAGNOSIS — Z79899 Other long term (current) drug therapy: Secondary | ICD-10-CM | POA: Insufficient documentation

## 2021-12-31 DIAGNOSIS — Z171 Estrogen receptor negative status [ER-]: Secondary | ICD-10-CM | POA: Insufficient documentation

## 2021-12-31 DIAGNOSIS — Z9013 Acquired absence of bilateral breasts and nipples: Secondary | ICD-10-CM | POA: Diagnosis not present

## 2021-12-31 LAB — CMP (CANCER CENTER ONLY)
ALT: 7 U/L (ref 0–44)
AST: 12 U/L — ABNORMAL LOW (ref 15–41)
Albumin: 4.1 g/dL (ref 3.5–5.0)
Alkaline Phosphatase: 102 U/L (ref 38–126)
Anion gap: 6 (ref 5–15)
BUN: 11 mg/dL (ref 6–20)
CO2: 27 mmol/L (ref 22–32)
Calcium: 9.9 mg/dL (ref 8.9–10.3)
Chloride: 105 mmol/L (ref 98–111)
Creatinine: 0.68 mg/dL (ref 0.44–1.00)
GFR, Estimated: >60 mL/min (ref >60)
Glucose, Bld: 88 mg/dL (ref 70–99)
Potassium: 3.9 mmol/L (ref 3.5–5.1)
Sodium: 138 mmol/L (ref 135–145)
Total Bilirubin: 0.5 mg/dL (ref 0.3–1.2)
Total Protein: 6.9 g/dL (ref 6.5–8.1)

## 2021-12-31 LAB — CBC WITH DIFFERENTIAL (CANCER CENTER ONLY)
Abs Immature Granulocytes: 0.01 10*3/uL (ref 0.00–0.07)
Basophils Absolute: 0 10*3/uL (ref 0.0–0.1)
Basophils Relative: 1 %
Eosinophils Absolute: 0.1 10*3/uL (ref 0.0–0.5)
Eosinophils Relative: 2 %
HCT: 38.2 % (ref 36.0–46.0)
Hemoglobin: 12.6 g/dL (ref 12.0–15.0)
Immature Granulocytes: 0 %
Lymphocytes Relative: 57 %
Lymphs Abs: 2.7 10*3/uL (ref 0.7–4.0)
MCH: 28.6 pg (ref 26.0–34.0)
MCHC: 33 g/dL (ref 30.0–36.0)
MCV: 86.6 fL (ref 80.0–100.0)
Monocytes Absolute: 0.4 10*3/uL (ref 0.1–1.0)
Monocytes Relative: 8 %
Neutro Abs: 1.6 10*3/uL — ABNORMAL LOW (ref 1.7–7.7)
Neutrophils Relative %: 32 %
Platelet Count: 253 10*3/uL (ref 150–400)
RBC: 4.41 MIL/uL (ref 3.87–5.11)
RDW: 13.2 % (ref 11.5–15.5)
Smear Review: NORMAL
WBC Count: 4.8 10*3/uL (ref 4.0–10.5)
nRBC: 0 % (ref 0.0–0.2)

## 2021-12-31 LAB — VITAMIN D 25 HYDROXY (VIT D DEFICIENCY, FRACTURES): Vit D, 25-Hydroxy: 8.87 ng/mL — ABNORMAL LOW (ref 30–100)

## 2021-12-31 LAB — LACTATE DEHYDROGENASE: LDH: 181 U/L (ref 98–192)

## 2021-12-31 MED ORDER — SODIUM CHLORIDE 0.9% FLUSH
10.0000 mL | INTRAVENOUS | Status: DC | PRN
  Administered 2021-12-31: 10 mL

## 2021-12-31 MED ORDER — HEPARIN SOD (PORK) LOCK FLUSH 100 UNIT/ML IV SOLN
500.0000 [IU] | Freq: Once | INTRAVENOUS | Status: AC | PRN
  Administered 2021-12-31: 500 [IU]

## 2021-12-31 NOTE — Progress Notes (Signed)
Hematology/Oncology Consultation   Name: [name]      MRN: [id number]    Location: Room/bed info not found  Date: 12/31/2021 Time:3:15 PM   REFERRING PHYSICIAN: Debbrah Alar, NP  REASON FOR CONSULT:  History of breast cancer   DIAGNOSIS:  1. Right breast cancer, pN1cN1cM0, triple negative, poorly differentiated, diagnosed 2004   2. Left breast cancer, 2 different types, both Grade 3 invasive carcinoma, ER/PR negative diagnosed 2019 - Large mass HER 2 positive, stage IIA cT2N0M0 - Smaller mass HER 2 negative, stage 1A cT1N0M0  3. Vitamin D deficiency   HISTORY OF PRESENT ILLNESS:  Ms. [name] is avery pleasant 61 yo African American female with history of both right and left breast cancers.  In 2004, she diagnosed with right breast cancer, pN1cN1cM0, triple negative, poorly differentiated. She had a lumpectomy and ALND (Dr. Exie Parody). Margins were negative. 1 out of 3 sentinel lymph nodes were positive and 0 of 7 additional lymph nodes were positive. She completed adjuvant dose dense AC followed by Taxol (Dr. Francoise Ceo) and radiation with 50 Gy to the right breast and 10 Gy boost to the surgical cavity (Dr. Manuella Ghazi).  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 Francoise Ceo) 08/17/2017. This was followed by left simple mastectomy and a sentinel lymph node biopsy. Margins were negative, no lymphovascular invasion, o of 5 lymph nodes positive. Her case was was felt appropriate. discussed by the multidisciplinary clinic and surveillance without radiation She opted against genetic testing.  She has occasional twinges of pain across the left mastectomy site that come and go.  Right breast and left mastectomy exam today negative. No mass, lesion or rash noted.   No adenopathy noted.  She has mild swelling in both arms that waxes and wanes. No redness or pitting edema. She will do exercises with raising her arms to help reduce this. Radial pulses are 2+.  She is scheduled for right breast mammogram, US thyroid and CT of the chest with her PCP next week.  She is originally from [name], [place] and moved to [place] in 2019 to be closer to her family.  She had a cyst removed from an ovary in the 1980's.  Her mother had history of lung and esophageal cancers (smoker) No history of diabetes or thyroid disease.  She notes intermittent headaches due to open angle glaucoma in both eyes. She is followed by Dr. Benay Pillow.  She takes 1 baby aspirin daily.  She has occasional SOB with exertion and will take a break to rest as needed.  No fever, chills, n/v, cough, rash, dizziness, chest pain, palpitations, abdominal pain or changes in bowel or bladder habits.  No tenderness, numbness or tingling in her extremities.  No falls or syncope reported.  No ETOH or recreational drug use.  She smokes 5-6 cigarettes a day.  Appetite and hydration are good. Her weight is stable at 227 lbs.  She worked as a Art therapist and in Therapist, art prior to retirement.  ,  ROS: All other 10 point review of systems is negative.   PAST MEDICAL HISTORY:   Past Medical History:  Diagnosis Date   Breast cancer ([place])    BIL  mastectomy left    lumpectomy right   Glaucoma    Heart  murmur    History of alcohol abuse    History of drug abuse ([place])    Personal history of chemotherapy    Personal history of radiation therapy    Trichomonosis 03/23/2020    ALLERGIES: Allergies  Allergen Reactions   Latex Hives    Allergic reaction to condoms years ago.      MEDICATIONS:  Current Outpatient Medications on File Prior to Visit  Medication Sig Dispense Refill   ASPIRIN 81 PO Take 1 tablet by mouth daily.     atorvastatin (LIPITOR) 10 MG tablet Take 1 tablet (10 mg total) by  mouth daily. (Patient not taking: Reported on 12/29/2021) 90 tablet 3   latanoprost (XALATAN) 0.005 % ophthalmic solution SMARTSIG:In Eye(s)     No current facility-administered medications on file prior to visit.     PAST SURGICAL HISTORY Past Surgical History:  Procedure Laterality Date   BREAST BIOPSY     BREAST LUMPECTOMY Right 2004   triple Neg breast CA   MASTECTOMY Left 08/2017   TUBAL LIGATION  1991    FAMILY HISTORY: Family History  Problem Relation Age of Onset   Alcohol abuse Mother    Lung cancer Mother    Esophageal cancer Mother    Alcohol abuse Father    Arthritis Father    Kidney disease Father    Dementia Father    Arthritis Sister    Diabetes Sister    Arthritis Brother    Hypertension Brother    Sickle cell anemia Brother    HIV Brother    Heart attack Brother 47       massive MI   Thyroid disease Brother     SOCIAL HISTORY:  reports that she has been smoking cigarettes. She has a 31.50 pack-year smoking history. She has never used smokeless tobacco. She reports that she does not currently use alcohol. She reports that she does not currently use drugs after having used the following drugs: Marijuana and Cocaine.  PERFORMANCE STATUS: The patient's performance status is 0 - Asymptomatic  PHYSICAL EXAM: Most Recent Vital Signs: There were no vitals taken for this visit. There were no vitals taken for this visit.  General Appearance:    Alert, cooperative, no distress, appears stated age  Head:    Normocephalic, without obvious abnormality, atraumatic  Eyes:    PERRL, conjunctiva/corneas clear, EOM's intact, fundi    benign, both eyes        Throat:   Lips, mucosa, and tongue normal; teeth and gums normal  Neck:   Supple, symmetrical, trachea midline, no adenopathy;    thyroid:  no enlargement/tenderness/nodules; no carotid   bruit or JVD  Back:     Symmetric, no curvature, ROM normal, no CVA tenderness  Lungs:     Clear to auscultation  bilaterally, respirations unlabored  Chest Wall:    No tenderness or deformity   Heart:    Regular rate and rhythm, S1 and S2 normal, no murmur, rub   or gallop     Abdomen:     Soft, non-tender, bowel sounds active all four quadrants,    no masses, no organomegaly        Extremities:   Extremities normal, atraumatic, no cyanosis or edema  Pulses:   2+ and symmetric all extremities  Skin:   Skin color, texture, turgor normal, no rashes or lesions  Lymph nodes:   Cervical, supraclavicular, and axillary nodes normal  Neurologic:   CNII-XII intact, normal strength, sensation and reflexes  throughout    LABORATORY DATA:  No results found for this or any previous visit (from the past 48 hour(s)).    RADIOGRAPHY: No results found.     PATHOLOGY: None  ASSESSMENT/PLAN: Ms. Shackett is avery pleasant 61 yo African American female with extensive history of both right and left breast cancers as mentioned above.  At this time we will continue observation. So far, there has been no evidence of recurrence.  Mammogram scheduled for next week.  She is vitamin D deficient so we will get her on to 50,000 units once a week and recheck at follow-up.  Emla cream script also sent to her pharmacy as well.  Port flush every 2 months and follow-up in 4 months.   All questions were answered. The patient knows to call the clinic with any problems, questions or concerns. We can certainly see the patient much sooner if necessary.  The patient was discussed Dr. Marin Olp and he is in agreement with the aforementioned.   Lottie Dawson, NP

## 2021-12-31 NOTE — Patient Instructions (Signed)

## 2022-01-01 LAB — CANCER ANTIGEN 27.29: CA 27.29: 13.3 U/mL (ref 0.0–38.6)

## 2022-01-03 ENCOUNTER — Encounter: Payer: Self-pay | Admitting: Family

## 2022-01-03 ENCOUNTER — Telehealth: Payer: Self-pay

## 2022-01-03 ENCOUNTER — Telehealth: Payer: Self-pay | Admitting: *Deleted

## 2022-01-03 MED ORDER — LIDOCAINE-PRILOCAINE 2.5-2.5 % EX CREA: 1.0000 | TOPICAL_CREAM | CUTANEOUS | 3 refills | Status: DC | PRN

## 2022-01-03 MED ORDER — ERGOCALCIFEROL 1.25 MG (50000 UT) PO CAPS: 50000.0000 [IU] | ORAL_CAPSULE | ORAL | 3 refills | Status: DC

## 2022-01-03 NOTE — Telephone Encounter (Signed)
Per 12/31/21 los - called and lvm of upcoming appointments - requested callback to confirm

## 2022-01-03 NOTE — Telephone Encounter (Signed)
-----   Message from Celso Amy, NP sent at 01/03/2022  9:58 AM EDT ----- Her vitamin D is quite low. I have sent in a script for Vitamin 50,000 units once a week as well as Emla cream for her port. Thank you!!!   ----- Message ----- From: Interface, Lab In [place] Sent: 12/31/2021   3:26 PM EDT To: Celso Amy, NP

## 2022-01-05 ENCOUNTER — Encounter (HOSPITAL_BASED_OUTPATIENT_CLINIC_OR_DEPARTMENT_OTHER): Payer: Self-pay

## 2022-01-05 ENCOUNTER — Ambulatory Visit (HOSPITAL_BASED_OUTPATIENT_CLINIC_OR_DEPARTMENT_OTHER)
Admission: RE | Admit: 2022-01-05 | Discharge: 2022-01-05 | Disposition: A | Discharge status: Home / Self Care | Payer: Medicare Other | Source: Ambulatory Visit | Attending: Family | Admitting: Family

## 2022-01-05 DIAGNOSIS — E010 Iodine-deficiency related diffuse (endemic) goiter: Secondary | ICD-10-CM | POA: Diagnosis present

## 2022-01-05 DIAGNOSIS — Z1231 Encounter for screening mammogram for malignant neoplasm of breast: Secondary | ICD-10-CM

## 2022-01-05 DIAGNOSIS — Z122 Encounter for screening for malignant neoplasm of respiratory organs: Secondary | ICD-10-CM

## 2022-01-05 DIAGNOSIS — Z87891 Personal history of nicotine dependence: Secondary | ICD-10-CM | POA: Insufficient documentation

## 2022-01-05 DIAGNOSIS — Z720 Tobacco use: Secondary | ICD-10-CM

## 2022-01-06 ENCOUNTER — Other Ambulatory Visit: Payer: Self-pay | Admitting: Family

## 2022-01-06 DIAGNOSIS — R928 Other abnormal and inconclusive findings on diagnostic imaging of breast: Secondary | ICD-10-CM

## 2022-01-14 ENCOUNTER — Ambulatory Visit
Admission: RE | Admit: 2022-01-14 | Discharge: 2022-01-14 | Disposition: A | Discharge status: Home / Self Care | Payer: Medicare Other | Source: Ambulatory Visit | Attending: Family | Admitting: Family

## 2022-01-14 ENCOUNTER — Other Ambulatory Visit: Payer: Medicare Other

## 2022-01-14 ENCOUNTER — Ambulatory Visit: Admission: RE | Admit: 2022-01-14 | Payer: Medicare Other | Source: Ambulatory Visit

## 2022-01-14 ENCOUNTER — Other Ambulatory Visit: Payer: Self-pay | Admitting: Family

## 2022-01-14 DIAGNOSIS — R928 Other abnormal and inconclusive findings on diagnostic imaging of breast: Secondary | ICD-10-CM

## 2022-01-14 DIAGNOSIS — R921 Mammographic calcification found on diagnostic imaging of breast: Secondary | ICD-10-CM

## 2022-01-21 ENCOUNTER — Other Ambulatory Visit: Payer: Self-pay

## 2022-01-21 DIAGNOSIS — Z923 Personal history of irradiation: Secondary | ICD-10-CM | POA: Insufficient documentation

## 2022-01-21 DIAGNOSIS — C50919 Malignant neoplasm of unspecified site of unspecified female breast: Secondary | ICD-10-CM | POA: Insufficient documentation

## 2022-01-21 DIAGNOSIS — F1011 Alcohol abuse, in remission: Secondary | ICD-10-CM | POA: Insufficient documentation

## 2022-01-21 DIAGNOSIS — R011 Cardiac murmur, unspecified: Secondary | ICD-10-CM | POA: Insufficient documentation

## 2022-01-21 DIAGNOSIS — Z9221 Personal history of antineoplastic chemotherapy: Secondary | ICD-10-CM | POA: Insufficient documentation

## 2022-01-21 DIAGNOSIS — F1911 Other psychoactive substance abuse, in remission: Secondary | ICD-10-CM | POA: Insufficient documentation

## 2022-01-21 DIAGNOSIS — H409 Unspecified glaucoma: Secondary | ICD-10-CM | POA: Insufficient documentation

## 2022-01-31 ENCOUNTER — Ambulatory Visit
Admission: RE | Admit: 2022-01-31 | Discharge: 2022-01-31 | Disposition: A | Discharge status: Home / Self Care | Payer: Medicare Other | Source: Ambulatory Visit | Attending: Family | Admitting: Family

## 2022-01-31 DIAGNOSIS — R921 Mammographic calcification found on diagnostic imaging of breast: Secondary | ICD-10-CM

## 2022-01-31 HISTORY — PX: BREAST BIOPSY: SHX20

## 2022-02-01 ENCOUNTER — Telehealth: Payer: Self-pay

## 2022-02-01 DIAGNOSIS — C801 Malignant (primary) neoplasm, unspecified: Secondary | ICD-10-CM

## 2022-02-01 HISTORY — DX: Malignant (primary) neoplasm, unspecified: C80.1

## 2022-02-01 NOTE — Telephone Encounter (Signed)
Reviewed results with patient and plan for axillary ultrasound and referral to breast surgeon. Pt given both office numbers.  Questions answered.

## 2022-02-01 NOTE — Telephone Encounter (Signed)
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 Needs referral to surgery and right axillary [place] per pathology representative.  Patient requesting a call from [facility]

## 2022-02-03 ENCOUNTER — Encounter: Payer: Self-pay | Admitting: Cardiology

## 2022-02-03 ENCOUNTER — Ambulatory Visit: Payer: Medicare Other | Attending: Cardiology | Admitting: Cardiology

## 2022-02-03 VITALS — BP 142/76 | HR 94 | Ht 64.0 in | Wt 229.1 lb

## 2022-02-03 DIAGNOSIS — R0609 Other forms of dyspnea: Secondary | ICD-10-CM | POA: Insufficient documentation

## 2022-02-03 DIAGNOSIS — E782 Mixed hyperlipidemia: Secondary | ICD-10-CM

## 2022-02-03 DIAGNOSIS — F17210 Nicotine dependence, cigarettes, uncomplicated: Secondary | ICD-10-CM | POA: Insufficient documentation

## 2022-02-03 DIAGNOSIS — Z716 Tobacco abuse counseling: Secondary | ICD-10-CM | POA: Diagnosis not present

## 2022-02-03 DIAGNOSIS — Z923 Personal history of irradiation: Secondary | ICD-10-CM | POA: Diagnosis present

## 2022-02-03 DIAGNOSIS — I700 Atherosclerosis of aorta: Secondary | ICD-10-CM

## 2022-02-03 DIAGNOSIS — R072 Precordial pain: Secondary | ICD-10-CM | POA: Diagnosis present

## 2022-02-03 HISTORY — DX: Mixed hyperlipidemia: E78.2

## 2022-02-03 MED ORDER — NITROGLYCERIN 0.4 MG SL SUBL: 0.4000 mg | SUBLINGUAL_TABLET | SUBLINGUAL | 6 refills | Status: AC | PRN

## 2022-02-03 MED ORDER — METOPROLOL TARTRATE 100 MG PO TABS: ORAL_TABLET | ORAL | 0 refills | Status: DC

## 2022-02-03 NOTE — Patient Instructions (Addendum)
Medication Instructions:  Take Metoprolol '100mg'$  1 tablet 2 hours prior to CT scan  Nitroglycerin  Use nitroglycerin 1 tablet placed under the tongue at the first sign of chest pain or an angina attack. 1 tablet may be used every 5 minutes as needed, for up to 15 minutes. Do not take more than 3 tablets in 15 minutes. If pain persist call 911 or go to the nearest ED.    *If you need a refill on your cardiac medications before your next appointment, please call your pharmacy*   Lab Work: BMP- 1 week prior to CT Scan 2nd Floor - [address]  If you have labs (blood work) drawn today and your tests are completely normal, you will receive your results only by: [facility] [place] (if you have MyChart) OR A paper copy in the mail If you have any lab test that is abnormal or we need to change your treatment, we will call you to review the results.   Testing/Procedures:   Your cardiac CT will be scheduled at one of the below locations:   [facility] [address] [place], [place] [address] [phone]   At [facility], please arrive at the [facility] and Children's Entrance (Entrance C2) of [facility] 30 minutes prior to test start time. You can use the FREE valet parking offered at entrance C (encouraged to control the heart rate for the test)  Proceed to the [facility] Radiology Department (first floor) to check-in and test prep.  All radiology patients and guests should use entrance C2 at [facility], accessed from [facility], even though the hospital's physical address listed is [address].      Please follow these instructions carefully (unless otherwise directed):    On the Night Before the Test: Be sure to Drink plenty of water. Do not consume any caffeinated/decaffeinated beverages or chocolate 12 hours prior to your test. Do not take any antihistamines 12 hours prior to your test.  On the Day of the  Test: Drink plenty of water until 1 hour prior to the test. Do not eat any food 4 hours prior to the test. You may take your regular medications prior to the test.  Take metoprolol (Lopressor) two hours prior to test. FEMALES- please wear underwire-free bra if available, avoid dresses & tight clothing        After the Test: Drink plenty of water. After receiving IV contrast, you may experience a mild flushed feeling. This is normal. On occasion, you may experience a mild rash up to 24 hours after the test. This is not dangerous. If this occurs, you can take Benadryl 25 mg and increase your fluid intake. If you experience trouble breathing, this can be serious. If it is severe call 911 IMMEDIATELY. If it is mild, please call our office. If you take any of these medications: Glipizide/Metformin, Avandament, Glucavance, please do not take 48 hours after completing test unless otherwise instructed.  We will call to schedule your test 2-4 weeks out understanding that some insurance companies will need an authorization prior to the service being performed.   For non-scheduling related questions, please contact the cardiac imaging nurse navigator should you have any questions/concerns: Marchia Bond, Cardiac Imaging Nurse Navigator Gordy Clement, Cardiac Imaging Nurse Navigator [facility] Direct Office Dial: [phone]   For scheduling needs, including cancellations and rescheduling, please call [place], [phone].    Follow-Up: At [facility], you  and your health needs are our priority.  As part of our continuing mission to provide you with exceptional heart care, we have created designated Provider Care Teams.  These Care Teams include your primary Cardiologist (physician) and Advanced Practice Providers (APPs -  Physician Assistants and Nurse Practitioners) who all work together to provide you with the care you need, when you need it.  We recommend  signing up for the patient portal called "MyChart".  Sign up information is provided on this After Visit Summary.  MyChart is used to connect with patients for Virtual Visits (Telemedicine).  Patients are able to view lab/test results, encounter notes, upcoming appointments, etc.  Non-urgent messages can be sent to your provider as well.   To learn more about what you can do with MyChart, go to [email or web address].    Your next appointment:   6 month(s)  The format for your next appointment:   In Person  Provider:   Jyl Heinz, MD   Other Instructions Cardiac CT Angiogram A cardiac CT angiogram is a procedure to look at the heart and the area around the heart. It may be done to help find the cause of chest pains or other symptoms of heart disease. During this procedure, a substance called contrast dye is injected into the blood vessels in the area to be checked. A large X-ray machine, called a CT scanner, then takes detailed pictures of the heart and the surrounding area. The procedure is also sometimes called a coronary CT angiogram, coronary artery scanning, or CTA. A cardiac CT angiogram allows the health care provider to see how well blood is flowing to and from the heart. The health care provider will be able to see if there are any problems, such as: Blockage or narrowing of the coronary arteries in the heart. Fluid around the heart. Signs of weakness or disease in the muscles, valves, and tissues of the heart. Tell a health care provider about: Any allergies you have. This is especially important if you have had a previous allergic reaction to contrast dye. All medicines you are taking, including vitamins, herbs, eye drops, creams, and over-the-counter medicines. Any blood disorders you have. Any surgeries you have had. Any medical conditions you have. Whether you are pregnant or may be pregnant. Any anxiety disorders, chronic pain, or other conditions you have that may  increase your stress or prevent you from lying still. What are the risks? Generally, this is a safe procedure. However, problems may occur, including: Bleeding. Infection. Allergic reactions to medicines or dyes. Damage to other structures or organs. Kidney damage from the contrast dye that is used. Increased risk of cancer from radiation exposure. This risk is low. Talk with your health care provider about: The risks and benefits of testing. How you can receive the lowest dose of radiation. What happens before the procedure? Wear comfortable clothing and remove any jewelry, glasses, dentures, and hearing aids. Follow instructions from your health care provider about eating and drinking. This may include: For 12 hours before the procedure -- avoid caffeine. This includes tea, coffee, soda, energy drinks, and diet pills. Drink plenty of water or other fluids that do not have caffeine in them. Being well hydrated can prevent complications. For 4-6 hours before the procedure -- stop eating and drinking. The contrast dye can cause nausea, but this is less likely if your stomach is empty. Ask your health care provider about changing or stopping your regular medicines. This is especially important if you are  taking diabetes medicines, blood thinners, or medicines to treat problems with erections (erectile dysfunction). What happens during the procedure?  Hair on your chest may need to be removed so that small sticky patches called electrodes can be placed on your chest. These will transmit information that helps to monitor your heart during the procedure. An IV will be inserted into one of your veins. You might be given a medicine to control your heart rate during the procedure. This will help to ensure that good images are obtained. You will be asked to lie on an exam table. This table will slide in and out of the CT machine during the procedure. Contrast dye will be injected into the IV. You might  feel warm, or you may get a metallic taste in your mouth. You will be given a medicine called nitroglycerin. This will relax or dilate the arteries in your heart. The table that you are lying on will move into the CT machine tunnel for the scan. The person running the machine will give you instructions while the scans are being done. You may be asked to: Keep your arms above your head. Hold your breath. Stay very still, even if the table is moving. When the scanning is complete, you will be moved out of the machine. The IV will be removed. The procedure may vary among health care providers and hospitals. What can I expect after the procedure? After your procedure, it is common to have: A metallic taste in your mouth from the contrast dye. A feeling of warmth. A headache from the nitroglycerin. Follow these instructions at home: Take over-the-counter and prescription medicines only as told by your health care provider. If you are told, drink enough fluid to keep your urine pale yellow. This will help to flush the contrast dye out of your body. Most people can return to their normal activities right after the procedure. Ask your health care provider what activities are safe for you. It is up to you to get the results of your procedure. Ask your health care provider, or the department that is doing the procedure, when your results will be ready. Keep all follow-up visits as told by your health care provider. This is important. Contact a health care provider if: You have any symptoms of allergy to the contrast dye. These include: Shortness of breath. Rash or hives. A racing heartbeat. Summary A cardiac CT angiogram is a procedure to look at the heart and the area around the heart. It may be done to help find the cause of chest pains or other symptoms of heart disease. During this procedure, a large X-ray machine, called a CT scanner, takes detailed pictures of the heart and the surrounding  area after a contrast dye has been injected into blood vessels in the area. Ask your health care provider about changing or stopping your regular medicines before the procedure. This is especially important if you are taking diabetes medicines, blood thinners, or medicines to treat erectile dysfunction. If you are told, drink enough fluid to keep your urine pale yellow. This will help to flush the contrast dye out of your body. This information is not intended to replace advice given to you by your health care provider. Make sure you discuss any questions you have with your health care provider. Document Revised: 11/07/2018 Document Reviewed: 11/07/2018 Elsevier Patient Education  2020 [place] physician has requested that you have an echocardiogram. Echocardiography is a painless test  that uses sound waves to create images of your heart. It provides your doctor with information about the size and shape of your heart and how well your heart's chambers and valves are working. This procedure takes approximately one hour. There are no restrictions for this procedure. Please do NOT wear cologne, perfume, aftershave, or lotions (deodorant is allowed). Please arrive 15 minutes prior to your appointment time.

## 2022-02-03 NOTE — Progress Notes (Signed)
Cardiology Office Note:    Date:  02/03/2022   ID:  Jaime [name], DOB [birth year], MRN [id number]  PCP:  Debbrah Alar, NP  Cardiologist:  Jenean Lindau, MD   Referring MD: Debbrah Alar, NP    ASSESSMENT:    1. Aortic atherosclerosis ([place])   2. DOE (dyspnea on exertion)   3. Personal history of radiation therapy   4. Mixed dyslipidemia   5. Precordial pain    PLAN:    In order of problems listed above:  Primary prevention stressed with the patient.  Importance of compliance with diet medication stressed and she vocalized understanding. Dyspnea on exertion: Concerning.  She also has some chest tightness at times.  Following recommendations were made.  Sublingual nitroglycerin prescription was sent, its protocol and 911 protocol explained and the patient vocalized understanding questions were answered to the patient's satisfaction.  She was recommended CT coronary angiography with FFR.  Other modalities were discussed but she preferred this 1. Cardiac murmur: Echocardiogram will be done to assess murmur heard on auscultation. Mixed dyslipidemia: Diet emphasized.  After review of her CT coronary angiography will discuss statin therapy with her as needed. Cigarette smoker: I spent 5 minutes with the patient discussing solely about smoking. Smoking cessation was counseled. I suggested to the patient also different medications and pharmacological interventions. Patient is keen to try stopping on its own at this time. He will get back to me if he needs any further assistance in this matter. Morbid obesity: Weight reduction stressed and diet was emphasized and she promises to do better. Patient will be seen in follow-up appointment in 6 months or earlier if the patient has any concerns    Medication Adjustments/Labs and Tests Ordered: Current medicines are reviewed at length with the patient today.  Concerns regarding medicines are outlined above.  No orders of the  defined types were placed in this encounter.  No orders of the defined types were placed in this encounter.    History of Present Illness:    Jaime [name] is a 61 y.o. female who is being seen today for the evaluation of dyspnea on exertion at the request of Debbrah Alar, NP.  Patient is a pleasant 61 year old female.  She has past medical history of mixed dyslipidemia, morbid obesity and dyspnea on exertion.  She mentions to me that her dyspnea on exertion is getting worse she occasionally has chest tightness with this.  She has history of aortic atherosclerosis and was recommended statin therapy but she is not taking it now because of stomach upset.  At the time of my evaluation, the patient is alert awake oriented and in no distress.  No history of diabetes mellitus.  Past Medical History:  Diagnosis Date   Aortic atherosclerosis ([place]) 03/13/2020   Atypical chest pain 02/13/2019   Breast cancer ([place])    BIL  mastectomy left    lumpectomy right   DOE (dyspnea on exertion) 12/29/2021   Emphysema lung ([place]) 03/13/2020   Glaucoma    Heart murmur    History of alcohol abuse    History of drug abuse ([place])    Hx of breast cancer 02/13/2019   Right (2004) pT1cN1 cM0 triple negative s/p lumpectomy and ALND with adjuvant ddAc-T and radiation Left (2018) - Stage IIA cT2N0M0 ER-/PR-/Her2+ and Stage IA CT1CN0 ER-/PR-/Her2- both ypT1aN0(sn) grade 3 - s/p 6C neoadjuvant chemo, and mastectomy and SLNB on 09/15/2017 (records scanned)   Lung mass 02/13/2019   Open-angle glaucoma 02/13/2019  Personal history of chemotherapy    Personal history of radiation therapy    Personal history of tobacco use, presenting hazards to health 03/12/2019   S/P left mastectomy 02/13/2019   Tobacco use 02/13/2019    Past Surgical History:  Procedure Laterality Date   BREAST BIOPSY     BREAST BIOPSY Right 01/31/2022   MM RT BREAST BX W LOC DEV 1ST LESION IMAGE BX SPEC STEREO GUIDE 01/31/2022 [facility]  MAMMOGRAPHY   BREAST LUMPECTOMY Right 2004   triple Neg breast CA   MASTECTOMY Left 08/2017   TUBAL LIGATION  1991    Current Medications: Current Meds  Medication Sig   ASPIRIN 81 PO Take 1 tablet by mouth daily.   ergocalciferol (VITAMIN D2) 1.25 MG (50000 UT) capsule Take 1 capsule (50,000 Units total) by mouth once a week.   latanoprost (XALATAN) 0.005 % ophthalmic solution Place 1 drop into both eyes daily.   lidocaine-prilocaine (EMLA) cream Apply 1 Application topically as needed.     Allergies:   Atorvastatin and Latex   Social History   Socioeconomic History   Marital status: Married    Spouse name: Not on file   Number of children: 2   Years of education: associates   Highest education level: Not on file  Occupational History   Not on file  Tobacco Use   Smoking status: Every Day    Packs/day: 0.75    Years: 42.00    Total pack years: 31.50    Types: Cigarettes   Smokeless tobacco: Never  Vaping Use   Vaping Use: Never used  Substance and Sexual Activity   Alcohol use: Not Currently    Comment: quit in 1999   Drug use: Not Currently    Types: Marijuana, Cocaine    Comment: quit in 1996-street drugs   Sexual activity: Not Currently  Other Topics Concern   Not on file  Social History Narrative   02/13/19   From: Born in [place] but grew up in [place]: with husband   Work: currently on disability      Family: Daughter - Sales executive (biological daughter) Son - Vicente Males (biological son) has one step son in [place]      Enjoys: walking, picnics, bowling, reading, playing cards      Exercise: walking - tries to do everyday - uses fitbit and tries to get 5000 steps   Diet: veggies, water, 2-3 cups of coffee, trying to monitor what she eats      Safety   Seat belts: Yes    Guns: No   Safe in relationships: Yes    Social Determinants of Health   Financial Resource Strain: Medium Risk (02/13/2019)   Overall Financial Resource Strain (CARDIA)     Difficulty of Paying Living Expenses: Somewhat hard  Food Insecurity: Not on file  Transportation Needs: Not on file  Physical Activity: Not on file  Stress: Not on file  Social Connections: Not on file     Family History: The patient's family history includes Alcohol abuse in her father and mother; Arthritis in her brother, father, and sister; Dementia in her father; Diabetes in her sister; Esophageal cancer in her mother; HIV in her brother; Heart attack (age of onset: 55) in her brother; Hypertension in her brother; Kidney disease in her father; Lung cancer in her mother; Sickle cell anemia in her brother; Thyroid disease in her brother.  ROS:   Please see the history of present illness.    All  other systems reviewed and are negative.  EKGs/Labs/Other Studies Reviewed:    The following studies were reviewed today: EKG reveals sinus rhythm and specific ST-T changes   Recent Labs: 12/31/2021: ALT 7; BUN 11; Creatinine 0.68; Hemoglobin 12.6; Platelet Count 253; Potassium 3.9; Sodium 138  Recent Lipid Panel    Component Value Date/Time   CHOL 198 06/09/2021 0948   TRIG 64.0 06/09/2021 0948   HDL 74.10 06/09/2021 0948   CHOLHDL 3 06/09/2021 0948   VLDL 12.8 06/09/2021 0948   LDLCALC 112 (H) 06/09/2021 0948    Physical Exam:    VS:  BP (!) 142/76   Pulse 94   Ht _0  (1.626 m)   Wt 229 lb 1.3 oz (103.9 kg)   SpO2 95%   BMI 39.32 kg/m     Wt Readings from Last 3 Encounters:  02/03/22 229 lb 1.3 oz (103.9 kg)  12/31/21 227 lb (103 kg)  12/29/21 227 lb (103 kg)     GEN: Patient is in no acute distress HEENT: Normal NECK: No JVD; No carotid bruits LYMPHATICS: No lymphadenopathy CARDIAC: S1 S2 regular, 2/6 systolic murmur at the apex. RESPIRATORY:  Clear to auscultation without rales, wheezing or rhonchi  ABDOMEN: Soft, non-tender, non-distended MUSCULOSKELETAL:  No edema; No deformity  SKIN: Warm and dry NEUROLOGIC:  Alert and oriented x 3 PSYCHIATRIC:  Normal  affect    Signed, Jenean Lindau, MD  02/03/2022 4:16 PM    [facility]

## 2022-02-04 ENCOUNTER — Ambulatory Visit: Payer: Self-pay | Admitting: Surgery

## 2022-02-04 NOTE — H&P (Signed)
Subjective   Chief Complaint: Breast Cancer   Cardiology - [facility]  History of Present Illness: Jaime [name] is a 61 y.o. female who is seen today as an office consultation at the request of Dr. Inda Castle for evaluation of Breast Cancer .   This is a 61 year old female with a very extensive past medical history who presents with a diagnosis of recurrent right breast cancer.  In 2004, the patient was diagnosed with right breast cancer.  She underwent a lumpectomy and axillary lymph node dissection.  She underwent radiation to her right breast and axilla and also had chemotherapy.  In 2019, the patient was diagnosed with multicentric left breast cancer.  She underwent neoadjuvant chemotherapy.  Subsequently she underwent a left mastectomy with sentinel lymph node biopsy.  She had 4 sentinel lymph nodes taken at that time and all were negative.  All of her previous cancer care was performed at [facility] in [place]. According to some of the pathology reports the cancer in 2019 was invasive ductal carcinoma Grade 3, ER/PR negative, HER2 positive.  I cannot find any pathology reports from her right breast surgery in 2004.  The patient denies any previous genetic testing.  Recently she underwent routine screening right mammogram.  This showed an area of calcifications in the lower central left breast with some focal asymmetry.  She underwent stereotactic biopsy on 11/6.  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  ER is 20% weakly positive, PR negative, Ki-67 20%, HER2 negative.  She was recently seen by cardiology for shortness of breath.  Echocardiogram and coronary CT scan have been ordered but not yet performed.  Ultrasound of the right axilla is recommended but has not yet been performed.  The patient still has her right-sided port in place.  This was inserted in 2019.  She does get this flushed regularly.  Review of Systems: A  complete review of systems was obtained from the patient.  I have reviewed this information and discussed as appropriate with the patient.  See HPI as well for other ROS.  Review of Systems  Constitutional: Negative.   HENT: Negative.    Eyes: Negative.   Respiratory:  Positive for shortness of breath.   Cardiovascular: Negative.   Gastrointestinal: Negative.   Genitourinary: Negative.   Musculoskeletal: Negative.   Skin: Negative.   Neurological: Negative.   Endo/Heme/Allergies: Negative.   Psychiatric/Behavioral: Negative.        Medical History: Past Medical History:  Diagnosis Date   Glaucoma (increased eye pressure)    History of cancer    Sleep apnea    Substance abuse (CMS-HCC)     Patient Active Problem List  Diagnosis   Aortic atherosclerosis (CMS-HCC)   Atypical chest pain   Recurrent breast cancer, right (CMS-HCC)   DOE (dyspnea on exertion)   Emphysema lung (CMS-HCC)   History of alcohol abuse   Glaucoma   Personal history of chemotherapy   S/P left mastectomy    Past Surgical History:  Procedure Laterality Date   LAPAROSCOPIC TUBAL LIGATION  1991   MASTECTOMY PARTIAL / LUMPECTOMY Right 2004   MASTECTOMY Left 2019   BREAST EXCISIONAL BIOPSY Right      Allergies  Allergen Reactions   Atorvastatin Other (See Comments)    constipated   Latex Hives    Allergic reaction to condoms years ago.    Current Outpatient Medications on File Prior to Visit  Medication Sig Dispense Refill  aspirin 81 MG chewable tablet Take 1 tablet by mouth once daily     ergocalciferol, vitamin D2, 1,250 mcg (50,000 unit) capsule Take 50,000 Units by mouth every 7 (seven) days     latanoprost (XALATAN) 0.005 % ophthalmic solution Apply 1 drop to eye once daily     lidocaine-prilocaine (EMLA) cream Apply 1 Application  topically as needed     atorvastatin (LIPITOR) 10 MG tablet Take 10 mg by mouth once daily (Patient not taking: Reported on 02/04/2022)     metoprolol  tartrate (LOPRESSOR) 100 MG tablet Take one tablet 2 hours before cardiac CT for heart greater than 55 (Patient not taking: Reported on 02/04/2022)     nitroGLYcerin (NITROSTAT) 0.4 MG SL tablet Place 0.4 mg under the tongue every 5 (five) minutes as needed (Patient not taking: Reported on 02/04/2022)     No current facility-administered medications on file prior to visit.    Family History  Problem Relation Age of Onset   Esophageal cancer Mother    Kidney disease Father    Diabetes Sister    High blood pressure (Hypertension) Brother      Social History   Tobacco Use  Smoking Status Every Day   Types: Cigarettes  Smokeless Tobacco Never     Social History   Socioeconomic History   Marital status: Married  Tobacco Use   Smoking status: Every Day    Types: Cigarettes   Smokeless tobacco: Never  Substance and Sexual Activity   Alcohol use: Not Currently   Drug use: Not Currently    Objective:    Vitals:   02/04/22 1129  BP: (!) 142/90  Pulse: 96  Temp: 36.3 C (97.3 F)  SpO2: 97%  Weight: (!) 104.3 kg (230 lb)  Height: 162.6 cm (_0 )    Body mass index is 39.48 kg/m.  Physical Exam   Constitutional:  WDWN in NAD, conversant, no obvious deformities; lying in bed comfortably Eyes:  Pupils equal, round; sclera anicteric; moist conjunctiva; no lid lag HENT:  Oral mucosa moist; good dentition  Neck:  No masses palpated, trachea midline; no thyromegaly Lungs:  CTA bilaterally; normal respiratory effort Breasts: Left chest shows a well-healed mastectomy incision with a slight dogear in the medial end of the incision.  No palpable masses.  No axillary lymphadenopathy. Right breast shows some residual radiation skin changes in the left axilla and the upper outer breast.  She has 2 well-healed incisions.  The axillary scar is fairly contracted.  She has some firmness just above her right upper outer quadrant lumpectomy scar.  This firmness is fairly superficial.  Her  right chest port site is clean with no sign of infection.  The biopsy site is in the right lower central breast.  No palpable masses in this area.  No nipple discharge.  No palpable lymphadenopathy. CV:  Regular rate and rhythm; no murmurs; extremities well-perfused with no edema Abd:  +bowel sounds, soft, non-tender, no palpable organomegaly; no palpable hernias Musc:  Unable to assess gait; no apparent clubbing or cyanosis in extremities Lymphatic:  No palpable cervical or axillary lymphadenopathy Skin:  Warm, dry; no sign of jaundice Psychiatric - alert and oriented x 4; calm mood and affect   Labs, Imaging and Diagnostic Testing:  Diagnosis Breast, right, needle core biopsy, lower - INVASIVE DUCTAL CARCINOMA (NOS)/INVASIVE MAMMARY CARCINOMA (NST) SEE NOTE - FOCAL DUCTAL CARCINOMA IN SITU, SOLID TYPE, NUCLEAR GRADE 3 OF 3. - TUBULE FORMATION: SCORE 3 OF 3 - NUCLEAR  PLEOMORPHISM: SCORE 2 OF 3 - MITOTIC COUNT: SCORE 1 OF 3 - TOTAL SCORE: 6 OF 9 - OVERALL GRADE: II OF III - LYMPHOVASCULAR INVASION: NOT IDENTIFIED 2 of 4 FINAL for Stivers, Kasheena C ([id number]) Diagnosis(continued) - CANCER LENGTH: 5 MM IN GREATEST LINEAR DIMENSION ON FRAGMENTED CORES. - CALCIFICATIONS: PRESENT - OTHER FINDINGS: N/A NOTE: DR. PICKLESIMER REVIEWED THE CASE AND CONCURS WITH THE INTERPRETATION. A BREAST PROGNOSTIC PROFILE (ER, PR, KI-67 AND HER2) IS PENDING AND WILL BE REPORTED IN AN ADDENDUM. [facility] WAS NOTIFIED ON 02/01/2022. Tilford Pillar DO Pathologist, Electronic Signature (Case signed 02/01/2022)  PROGNOSTIC INDICATORS Results: IMMUNOHISTOCHEMICAL AND MORPHOMETRIC ANALYSIS PERFORMED MANUALLY The tumor cells are equivocal for Her2 (2+). Her2 by FISH will be performed and the results reported separately. Estrogen Receptor: 20%, POSITIVE, WEAK STAINING INTENSITY Progesterone Receptor: 0%, NEGATIVE Proliferation Marker Ki67: 20% COMMENT: The negative hormone  receptor study(ies) in this case has an internal positive control. REFERENCE RANGE ESTROGEN RECEPTOR NEGATIVE 0% POSITIVE =>1% REFERENCE RANGE PROGESTERONE RECEPTOR NEGATIVE 0% POSITIVE =>1% All controls stained appropriately Casimer Lanius MD Pathologist, Electronic Signature ( Signed 02/02/2022)  FLUORESCENCE IN-SITU HYBRIDIZATION Results: GROUP 2: HER2 **NEGATIVE** Equivocal form of amplification of the HER2 gene was detected in the IHC 2+ tissue sample received from this individual. HER2 FISH was performed by a technologist and cell imaging and analysis on the [employer]. First Analysis: RATIO OF HER2/CEN 17 SIGNALS 2.40 AVERAGE HER2 COPY NUMBER PER CELL 3.60 An additional 20 cells was analyzed by FISH in a separate region with invasive cancer. Additional Analysis: RATIO OF HER2/CEN 17 SIGNALS 2.00 AVERAGE HER2 COPY NUMBER PER CELL 3.55 The ratio of HER2/CEN 17 is within the normal range >=2.0 of HER2/CEN 17 and a copy number of HER2 signals per cell is < 4.0. Arch Pathol Lab Med 1:1,2018 COMMENT: Evidence is limited on the efficacy of HER2-targeted therapy in the small subset of cases with HER2/CEN 17 ratio >=2.0 and an average HER2 copy number <4.0/cell. In the first generation of adjuvant trastuzumab trials, patients in this subgroup who were randomized to the trastuzumab arm did not appear to derive an improvement in disease free or overall survival, but there were too few such cases to draw definitive conclusions. IHC expression for HER2 should be used to complement ISH and define HER2 status. If IHC result is not 3+ positive, it is recommended that the specimen be considered HER2 negative because of the low HER2 copy number by ISH and lack of protein overexpression. Casimer Lanius MD Pathologist, Electronic Signature     CLINICAL DATA:  Screening. History of LEFT mastectomy.   EXAM: DIGITAL SCREENING UNILATERAL RIGHT MAMMOGRAM WITH CAD AND TOMOSYNTHESIS    TECHNIQUE: Right screening digital craniocaudal and mediolateral oblique mammograms were obtained. Right screening digital breast tomosynthesis was performed. The images were evaluated with computer-aided detection.   COMPARISON:  Previous exam(s).   ACR Breast Density Category b: There are scattered areas of fibroglandular density.   FINDINGS: In the right breast, a possible asymmetry warrants further evaluation. This possible asymmetry is seen within the lower RIGHT breast, cc slice 61.   In the left breast, no findings suspicious for malignancy.   IMPRESSION: Further evaluation is suggested for possible asymmetry in the right breast.   RECOMMENDATION: Diagnostic mammogram and possibly ultrasound of the right breast. (Code:[id number])   The patient will be contacted regarding the findings, and additional imaging will be scheduled.   BI-RADS CATEGORY  0: Incomplete. Need additional imaging evaluation and/or prior  mammograms for comparison.     Electronically Signed   By: Franki Cabot M.D.   On: 01/05/2022 14:22  Assessment and Plan:  Diagnoses and all orders for this visit:  Recurrent breast cancer, right (CMS-HCC)    The patient is status post right lumpectomy and axillary lymph node dissection followed by chemotherapy and radiation in 2004.  She is also status post mastectomy and neoadjuvant chemotherapy for left breast invasive ductal carcinoma in 2019.  She now has a another invasive ductal carcinoma which is most likely a triple negative in the right breast.  I had a long discussion with the patient about her options.  At this point with a recurrence after previous breast conserving therapy, we would recommend a right mastectomy.  She has had a full right axillary dissection so there would be no indications for sentinel lymph node biopsy.  The patient is reluctant to consent to a another mastectomy without further information.  We will expedite a right axillary  ultrasound to evaluate the palpable mass near her old incision as well as looking for any possible enlarged lymph nodes in the remaining axillary contents.  We will also expedite referrals to genetics, oncology, radiation oncology.  It is unlikely that she would be a candidate for further radiation but I would like her to have all this information before she makes the decision to proceed with a right mastectomy.  We will tentatively plan to schedule a right mastectomy while the rest of these consults and studies are pending.  No follow-ups on file.  Carlean Jews, MD  02/04/2022 11:50 AM

## 2022-02-07 ENCOUNTER — Telehealth: Payer: Self-pay | Admitting: Genetic Counselor

## 2022-02-07 NOTE — Telephone Encounter (Signed)
Called patient per 11/13 in basket. Scheduled new appointment with patient.

## 2022-02-08 ENCOUNTER — Ambulatory Visit
Admission: RE | Admit: 2022-02-08 | Discharge: 2022-02-08 | Disposition: A | Discharge status: Home / Self Care | Payer: Medicare Other | Source: Ambulatory Visit | Attending: Family | Admitting: Family

## 2022-02-08 DIAGNOSIS — C801 Malignant (primary) neoplasm, unspecified: Secondary | ICD-10-CM

## 2022-02-08 NOTE — Progress Notes (Signed)
New Breast Cancer Diagnosis: Right Breast  Did patient present with symptoms (if so, please note symptoms) or screening mammography?:Screening Calcifications in the lower central left breast with some focal asymmetry.   Location and Extent of disease :right breast. Located at *** o'clock position, measured  *** cm in greatest dimension. Adenopathy {:[id number]}.  Histology per Pathology Report: grade 2, Invasive Ductal Carcinoma with some focal DCIS.  Receptor Status: ER(weakly positive), PR (negative), Her2-neu (negative), Ki-(20%)  Surgeon and surgical plan, if any:   -At this point with a recurrence after previous breast conserving therapy, we would recommend a right mastectomy.  - She has had a full right axillary dissection so there would be no indications for sentinel lymph node biopsy.  - The patient is reluctant to consent to a another mastectomy without further information.   -We will expedite a right axillary ultrasound to evaluate the palpable mass near her old incision as well as looking for any possible enlarged lymph nodes in the remaining axillary contents.   -We will also expedite referrals to genetics, oncology, radiation oncology.   -It is unlikely that she would be a candidate for further radiation but I would like her to have all this information before she makes the decision to proceed with a right mastectomy.    Medical oncologist, treatment if any:    Family History of Breast/Ovarian/Prostate Cancer:   Lymphedema issues, if any:      Pain issues, if any:     SAFETY ISSUES: Prior radiation? Right Breast  Pacemaker/ICD?  Possible current pregnancy? Is the patient on methotrexate?   Current Complaints / other details:   2004 Right Breast Lumpectomy and axillary lymph node dissection, radiation therapy and chemotherapy.  No pathology reports available.  2019-Multicentric left breast cancer, neoadjuvant chemotherapy, left mastectomy with SLN biopsy-4/4 nodes  negative. Invasive Ductal Carcinoma, Grade 3, ER/PR negative, HER2 positive.  Cancer care was at [facility] in [place]

## 2022-02-10 ENCOUNTER — Ambulatory Visit
Admission: RE | Admit: 2022-02-10 | Discharge: 2022-02-10 | Disposition: A | Discharge status: Home / Self Care | Payer: Medicare Other | Source: Ambulatory Visit | Attending: Radiation Oncology | Admitting: Radiation Oncology

## 2022-02-10 ENCOUNTER — Telehealth: Payer: Self-pay

## 2022-02-10 ENCOUNTER — Encounter: Payer: Self-pay | Admitting: Radiation Oncology

## 2022-02-10 ENCOUNTER — Other Ambulatory Visit: Payer: Self-pay

## 2022-02-10 VITALS — BP 172/90 | HR 67 | Temp 98.0°F | Resp 18 | Ht 60.0 in | Wt 231.1 lb

## 2022-02-10 DIAGNOSIS — C50111 Malignant neoplasm of central portion of right female breast: Secondary | ICD-10-CM

## 2022-02-10 DIAGNOSIS — Z853 Personal history of malignant neoplasm of breast: Secondary | ICD-10-CM | POA: Diagnosis not present

## 2022-02-10 DIAGNOSIS — Z9012 Acquired absence of left breast and nipple: Secondary | ICD-10-CM | POA: Insufficient documentation

## 2022-02-10 DIAGNOSIS — F1011 Alcohol abuse, in remission: Secondary | ICD-10-CM | POA: Diagnosis not present

## 2022-02-10 DIAGNOSIS — R918 Other nonspecific abnormal finding of lung field: Secondary | ICD-10-CM | POA: Insufficient documentation

## 2022-02-10 DIAGNOSIS — Z801 Family history of malignant neoplasm of trachea, bronchus and lung: Secondary | ICD-10-CM | POA: Insufficient documentation

## 2022-02-10 DIAGNOSIS — I700 Atherosclerosis of aorta: Secondary | ICD-10-CM | POA: Insufficient documentation

## 2022-02-10 DIAGNOSIS — Z7982 Long term (current) use of aspirin: Secondary | ICD-10-CM | POA: Insufficient documentation

## 2022-02-10 DIAGNOSIS — F17210 Nicotine dependence, cigarettes, uncomplicated: Secondary | ICD-10-CM | POA: Diagnosis not present

## 2022-02-10 DIAGNOSIS — J439 Emphysema, unspecified: Secondary | ICD-10-CM | POA: Diagnosis not present

## 2022-02-10 DIAGNOSIS — R011 Cardiac murmur, unspecified: Secondary | ICD-10-CM | POA: Diagnosis not present

## 2022-02-10 DIAGNOSIS — Z923 Personal history of irradiation: Secondary | ICD-10-CM | POA: Insufficient documentation

## 2022-02-10 DIAGNOSIS — Z170 Estrogen receptor positive status [ER+]: Secondary | ICD-10-CM | POA: Diagnosis not present

## 2022-02-10 DIAGNOSIS — Z79899 Other long term (current) drug therapy: Secondary | ICD-10-CM | POA: Diagnosis not present

## 2022-02-10 NOTE — Telephone Encounter (Signed)
   Pre-operative Risk Assessment    Patient Name: Jaime [name]  DOB: 1960/05/21 MRN: [id number]      Request for Surgical Clearance    Procedure:   right mastectomy  Date of Surgery:  Clearance TBD                                 Surgeon:  Dr. Donnie Mesa Surgeon's Group or Practice Name:  [facility] Phone number:  [phone] Fax number:  [phone]   Type of Clearance Requested:   - Pharmacy:  Hold Aspirin please advise   Type of Anesthesia:  General    Additional requests/questions:    SignedLowella Grip   02/10/2022, 2:46 PM

## 2022-02-10 NOTE — Progress Notes (Signed)
Radiation Oncology         [phone] ________________________________  Name: Jaime [name]        MRN: [id number]  Date of Service: 02/10/2022 DOB: 1960-05-12  CC:O'Sullivan, Lenna Sciara, NP  Donnie Mesa, MD     REFERRING PHYSICIAN: Donnie Mesa, MD   DIAGNOSIS: The encounter diagnosis was Malignant neoplasm of central portion of right breast [facility]).   HISTORY OF PRESENT ILLNESS: Jaime [name] is a 61 y.o. female seen at the request of Dr. Georgette Dover for a new diagnosis of right breast cancer.  She was diagnosed in 2004 with a pT1cN1M0, triple negative invasive ductal carcinoma of the right breast treated with lumpectomy and ALND with negative margins but 1 of 3 positive nodes.  She completed adjuvant chemotherapy and received adjuvant radiotherapy as well to the breast and regional nodes.  She was being followed and without recurrent disease but felt a palpable mass in the left breast in 2018 and was diagnosed with synchronous tumors.  Apparently she had a Stage IIA, cT2 N0 M0 HER2 amplified breast cancer as well as a Stage IA, cT1CN0 triple negative cancer.  She received neoadjuvant chemotherapy followed by left mastectomy and sentinel lymph node biopsy.  Her nodes were negative but she had 3 mm of residual disease she received adjuvant chemotherapy and her case had been discussed in the multidisciplinary tumor conference and it was felt that she truly had 2 distinct separate tumors.  Her radiation oncologist at that time did not recommend adjuvant radiotherapy.  She relocated to [facility] and established in October 2023 with Dr. Marin Olp.  She was set up for screening mammogram of the right breast which showed calcifications on her mammogram on the right breast. The calcifications were punctate and coarse within the central right breast.  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  Her cancer was weakly positive for estrogen receptor, PR negative, HER2 negative with a Ki-67 of 20%.  She met with Dr. Georgette Dover on 02/04/2022 and he has recommended that she undergo right mastectomy.  She did also undergo a right axillary lymph by ultrasound on 02/08/2022 which was negative for adenopathy.  She is also meeting with genetic counseling.  She is also followed by Dr. Marin Olp in  medical oncology.  She is seen today to discuss concerns about proceeding with mastectomy.    PREVIOUS RADIATION THERAPY: Yes   2004:  The right breast was treated with adjuvant radiotherapy in [place], [place]. She received 50 Gy to the right breast and nodes followed by a 10 Gy boost.    PAST MEDICAL HISTORY:  Past Medical History:  Diagnosis Date   Aortic atherosclerosis ([place]) 03/13/2020   Atypical chest pain 02/13/2019   Breast cancer ([place])    BIL  mastectomy left    lumpectomy right   DOE (dyspnea on exertion) 12/29/2021   Emphysema lung ([place]) 03/13/2020   Glaucoma    Heart murmur    History of alcohol abuse    History of drug abuse ([place])    Hx of breast cancer 02/13/2019   Right (2004) pT1cN1 cM0 triple negative s/p lumpectomy and ALND with adjuvant ddAc-T and radiation Left (2018) - Stage IIA cT2N0M0 ER-/PR-/Her2+ and Stage IA CT1CN0 ER-/PR-/Her2- both ypT1aN0(sn) grade 3 - s/p 6C neoadjuvant chemo, and mastectomy and SLNB on 09/15/2017 (records scanned)   Lung mass 02/13/2019   Open-angle glaucoma 02/13/2019   Personal  history of chemotherapy    Personal history of radiation therapy    Personal history of tobacco use, presenting hazards to health 03/12/2019   S/P left mastectomy 02/13/2019   Tobacco use 02/13/2019       PAST SURGICAL HISTORY: Past Surgical History:  Procedure Laterality Date   BREAST BIOPSY     BREAST BIOPSY Right 01/31/2022   MM RT BREAST BX W LOC DEV 1ST LESION IMAGE BX SPEC STEREO GUIDE 01/31/2022 [facility]   BREAST LUMPECTOMY Right 2004   triple Neg  breast CA   MASTECTOMY Left 08/2017   TUBAL LIGATION  1991     FAMILY HISTORY:  Family History  Problem Relation Age of Onset   Alcohol abuse Mother    Lung cancer Mother    Esophageal cancer Mother    Alcohol abuse Father    Arthritis Father    Kidney disease Father    Dementia Father    Arthritis Sister    Diabetes Sister    Arthritis Brother    Hypertension Brother    Sickle cell anemia Brother    HIV Brother    Heart attack Brother 68       massive MI   Thyroid disease Brother      SOCIAL HISTORY:  reports that she has been smoking cigarettes. She has a 31.50 pack-year smoking history. She has never used smokeless tobacco. She reports that she does not currently use alcohol. She reports that she does not currently use drugs after having used the following drugs: Marijuana and Cocaine.  The patient is married and lives in [place]. She used to work in Art therapist roles.    ALLERGIES: Atorvastatin and Latex   MEDICATIONS:  Current Outpatient Medications  Medication Sig Dispense Refill   ASPIRIN 81 PO Take 1 tablet by mouth daily.     ergocalciferol (VITAMIN D2) 1.25 MG (50000 UT) capsule Take 1 capsule (50,000 Units total) by mouth once a week. 8 capsule 3   latanoprost (XALATAN) 0.005 % ophthalmic solution Place 1 drop into both eyes daily.     lidocaine-prilocaine (EMLA) cream Apply 1 Application topically as needed. 30 g 3   metoprolol tartrate (LOPRESSOR) 100 MG tablet Take one tablet 2 hours before cardiac CT for heart greater than 55 1 tablet 0   nitroGLYCERIN (NITROSTAT) 0.4 MG SL tablet Place 1 tablet (0.4 mg total) under the tongue every 5 (five) minutes as needed for chest pain. 25 tablet 6   No current facility-administered medications for this encounter.     REVIEW OF SYSTEMS: On review of systems, the patient reports that she is doing okay overall but is of course frustrated with having another breast cancer. She states she is  hoping to have clarity from her genetic testing. She reports she desired a bilateral mastectomy at the time of her left breast cancer, and now wishes this would have been an option at that time. No other breast specific complaints are noted.      PHYSICAL EXAM:  Wt Readings from Last 3 Encounters:  02/10/22 231 lb 2 oz (104.8 kg)  02/03/22 229 lb 1.3 oz (103.9 kg)  12/31/21 227 lb (103 kg)   Temp Readings from Last 3 Encounters:  02/10/22 98 F (36.7 C) (Oral)  12/31/21 97.9 F (36.6 C) (Oral)  12/31/21 97.9 F (36.6 C) (Oral)   BP Readings from Last 3 Encounters:  02/10/22 (!) 172/90  02/03/22 (!) 142/76  12/31/21 (!) 147/42   Pulse  Readings from Last 3 Encounters:  02/10/22 67  02/03/22 94  12/31/21 72    In general this is a well appearing African American female in no acute distress. She's alert and oriented x4 and appropriate throughout the examination. Cardiopulmonary assessment is negative for acute distress and she exhibits normal effort. Bilateral breast exam is deferred.    ECOG = 1  0 - Asymptomatic (Fully active, able to carry on all predisease activities without restriction)  1 - Symptomatic but completely ambulatory (Restricted in physically strenuous activity but ambulatory and able to carry out work of a light or sedentary nature. For example, light housework, office work)  2 - Symptomatic, <50% in bed during the day (Ambulatory and capable of all self care but unable to carry out any work activities. Up and about more than 50% of waking hours)  3 - Symptomatic, >50% in bed, but not bedbound (Capable of only limited self-care, confined to bed or chair 50% or more of waking hours)  4 - Bedbound (Completely disabled. Cannot carry on any self-care. Totally confined to bed or chair)  5 - Death   Eustace Pen MM, Creech RH, Tormey DC, et al. [phone]). "Toxicity and response criteria of the [facility]". [place] Oncol. 5 (6):  649-55    LABORATORY DATA:  Lab Results  Component Value Date   WBC 4.8 12/31/2021   HGB 12.6 12/31/2021   HCT 38.2 12/31/2021   MCV 86.6 12/31/2021   PLT 253 12/31/2021   Lab Results  Component Value Date   NA 138 12/31/2021   K 3.9 12/31/2021   CL 105 12/31/2021   CO2 27 12/31/2021   Lab Results  Component Value Date   ALT 7 12/31/2021   AST 12 (L) 12/31/2021   ALKPHOS 102 12/31/2021   BILITOT 0.5 12/31/2021      RADIOGRAPHY: [place] AXILLA RIGHT  Result Date: 02/08/2022 CLINICAL DATA:  Recent diagnosis of invasive ductal carcinoma within the lower RIGHT breast. Patient presents today to exclude associated axillary lymphadenopathy. EXAM: ULTRASOUND OF THE RIGHT AXILLA COMPARISON:  Previous exams including RIGHT breast diagnostic mammogram dated 01/14/2022. FINDINGS: Ultrasound is performed, evaluating the RIGHT axilla, showing no enlarged or morphologically abnormal lymph nodes. LEFT axilla was evaluated for comparison, at patient's request, also showing no enlarged or morphologically abnormal lymph nodes. IMPRESSION: No evidence of metastatic lymphadenopathy in the RIGHT axilla. RECOMMENDATION: Per treatment plan for patient's recently diagnosed RIGHT breast cancer. I have discussed the findings and recommendations with the patient. If applicable, a reminder letter will be sent to the patient regarding the next appointment. BI-RADS CATEGORY  1: Negative. However, patient has a recent diagnosis of invasive ductal carcinoma within the lower RIGHT breast. Electronically Signed   By: Franki Cabot M.D.   On: 02/08/2022 15:09  MM RT BREAST BX W LOC DEV 1ST LESION IMAGE BX SPEC STEREO GUIDE  Addendum Date: 02/01/2022   ADDENDUM REPORT: 02/01/2022 12:02 ADDENDUM: Pathology revealed GRADE 2 INVASIVE DUCTAL CARCINOMA (NOS)/ INVASIVE MAMMARY CARCINOMA (NST), FOCAL DUCTAL CARCINOMA IN SITU, SOLID TYPE, NUCLEAR GRADE 3. CALCIFICATIONS: PRESENT of the RIGHT breast, lower (X clip) . This was  found to be concordant by Dr. Hassan Rowan. Pathology results were discussed with the patient by telephone. The patient reported doing well after the biopsy with tenderness at the site. Post biopsy instructions and care were reviewed and questions were answered. The patient was encouraged to call [facility] for any additional concerns. Per  patient request, pathology results were called to [facility] for guidance making surgical referral. (I spoke with Marin Roberts CMA). Recommendation: 1- surgical referral. 2- RIGHT breast axillary ultrasound. Pathology results reported by Stacie Acres RN on 02/01/2022. Electronically Signed   By: Margarette Canada M.D.   On: 02/01/2022 12:02   Result Date: 02/01/2022 CLINICAL DATA:  61 year old female for tissue sampling of LOWER RIGHT breast focal asymmetry with calcifications. EXAM: RIGHT BREAST STEREOTACTIC CORE NEEDLE BIOPSY COMPARISON:  Previous exam(s). FINDINGS: The patient and I discussed the procedure of stereotactic-guided biopsy including benefits and alternatives. We discussed the high likelihood of a successful procedure. We discussed the risks of the procedure including infection, bleeding, tissue injury, clip migration, and inadequate sampling. Informed written consent was given. The usual time out protocol was performed immediately prior to the procedure. Using sterile technique and 1% Lidocaine with and without epinephrine as local anesthetic, under stereotactic guidance, a 9 gauge vacuum assisted device was used to perform core needle biopsy of the focal asymmetry with calcifications in the posterior LOWER RIGHT breast using a LATERAL approach. Specimen radiograph was performed showing calcifications. Specimens with calcifications are identified for pathology. At the conclusion of the procedure, an X shaped tissue marker clip was deployed into the biopsy cavity. Follow-up 2-view mammogram was performed and dictated  separately. IMPRESSION: Stereotactic-guided biopsy of posterior LOWER RIGHT breast focal asymmetry with calcifications. No apparent complications. Electronically Signed: By: Margarette Canada M.D. On: 01/31/2022 10:21  MM CLIP PLACEMENT RIGHT  Result Date: 01/31/2022 CLINICAL DATA:  Evaluate X biopsy clip placement following stereotactic guided RIGHT breast biopsy. EXAM: 3D DIAGNOSTIC RIGHT MAMMOGRAM POST STEREOTACTIC BIOPSY COMPARISON:  Previous exam(s). FINDINGS: 3D Mammographic images were obtained following stereotactic guided biopsy of posterior LOWER RIGHT breast focal asymmetry with calcifications. The X biopsy marking clip is in expected position at the site of biopsy. IMPRESSION: Appropriate positioning of the X shaped biopsy marking clip at the site of biopsy in the posterior LOWER RIGHT breast. Final Assessment: Post Procedure Mammograms for Marker Placement Electronically Signed   By: Margarette Canada M.D.   On: 01/31/2022 10:31  MM DIAG BREAST TOMO UNI RIGHT  Result Date: 01/14/2022 CLINICAL DATA:  Patient returns today to evaluate a possible RIGHT breast asymmetry questioned on recent screening mammogram. Remote history of RIGHT breast cancer in 2004 status post lumpectomy. History of LEFT breast cancer in 2019 status post mastectomy. EXAM: DIGITAL DIAGNOSTIC UNILATERAL RIGHT MAMMOGRAM WITH TOMOSYNTHESIS TECHNIQUE: Right digital diagnostic mammography and breast tomosynthesis was performed. COMPARISON:  Previous exams including recent screening mammogram dated 01/05/2022. ACR Breast Density Category b: There are scattered areas of fibroglandular density. FINDINGS: On today's additional diagnostic views, including magnification views, there are loosely grouped punctate and coarse calcifications within the lower central LEFT breast, at posterior depth, with underlying focal asymmetry. IMPRESSION: Loosely grouped punctate and coarse calcifications within the lower central LEFT breast, at posterior depth,  with associated focal asymmetry. This is a suspicious finding for which stereotactic biopsy is recommended. RECOMMENDATION: Stereotactic biopsy for the calcifications with associated asymmetry in the lower central RIGHT breast at posterior depth. Stereotactic biopsy is tentatively scheduled for October 31st. I have discussed the findings and recommendations with the patient. If applicable, a reminder letter will be sent to the patient regarding the next appointment. BI-RADS CATEGORY  4: Suspicious. Electronically Signed   By: Franki Cabot M.D.   On: 01/14/2022 14:06      IMPRESSION/PLAN: 1. Likely Stage I,  cTxN0M0, grade 2, functionally triple negative invasive ductal carcinoma with remote history of triple negative and HER2 amplified invasive carcinomas involving both breasts. Dr. Lisbeth Renshaw discusses the pathology findings and reviews the nature of functionally triple negative breast disease. The consensus from the breast conference includes right mastectomy with possible sentinel node assessment determined at the time of surgery. She will need to meet back with Dr. Marin Olp to discuss possible systemic therapy. Dr. Lisbeth Renshaw would not anticipate a role for re-irradiation in the setting of prior 4 field tangent therapy, however we will follow up with her final pathology results. We would be happy to meet back as needed. She is in agreement with this plan. 2. Possible genetic predisposition to malignancy. The patient is a candidate for genetic testing given her personal  history. She will meet with our geneticist on 02/22/22.   In a visit lasting 60 minutes, greater than 50% of the time was spent face to face reviewing her case, as well as in preparation of, discussing, and coordinating the patient's care.  The above documentation reflects my direct findings during this shared patient visit. Please see the separate note by Dr. Lisbeth Renshaw on this date for the remainder of the patient's plan of care.    Carola Rhine, [facility]    **Disclaimer: This note was dictated with voice recognition software. Similar sounding words can inadvertently be transcribed and this note may contain transcription errors which may not have been corrected upon publication of note.**

## 2022-02-14 NOTE — Telephone Encounter (Signed)
Caller is following up on the patient's clearance for breast cancer surgery.  Caller stated they would like the patient to have surgery within the next two weeks

## 2022-02-14 NOTE — Telephone Encounter (Signed)
Pt has a CT 02/21/22. Per Dr. Geraldo Pitter he prefers to wait for CT evaluation before clearing the pt.

## 2022-02-15 ENCOUNTER — Other Ambulatory Visit: Payer: Medicare Other

## 2022-02-16 ENCOUNTER — Telehealth: Payer: Self-pay | Admitting: Cardiology

## 2022-02-16 NOTE — Telephone Encounter (Signed)
Pt called in because she has a CT on Monday 11/27 and she was supposed to get labs done 1 week prior. She wants to know if she can still get them now or will she need to r/s CT.

## 2022-02-16 NOTE — Telephone Encounter (Signed)
Advised that she may go to [facility] in [place]. Phone number for [facility] given to pt. Pt verbalized understanding and had no additional questions.

## 2022-02-17 LAB — BASIC METABOLIC PANEL
BUN/Creatinine Ratio: 18 (ref 12–28)
BUN: 13 mg/dL (ref 8–27)
CO2: 26 mmol/L (ref 20–29)
Calcium: 9.5 mg/dL (ref 8.7–10.3)
Chloride: 104 mmol/L (ref 96–106)
Creatinine, Ser: 0.73 mg/dL (ref 0.57–1.00)
Glucose: 117 mg/dL — ABNORMAL HIGH (ref 70–99)
Potassium: 4.4 mmol/L (ref 3.5–5.2)
Sodium: 143 mmol/L (ref 134–144)
eGFR: 94 mL/min/{1.73_m2} (ref >59)

## 2022-02-18 ENCOUNTER — Telehealth (HOSPITAL_COMMUNITY): Payer: Self-pay | Admitting: Emergency Medicine

## 2022-02-18 NOTE — Telephone Encounter (Signed)
Unable to leave message as patient answered phone and then hung up [facility] [phone] Office  [phone] Cell

## 2022-02-21 ENCOUNTER — Other Ambulatory Visit: Payer: Self-pay | Admitting: Genetic Counselor

## 2022-02-21 ENCOUNTER — Ambulatory Visit (HOSPITAL_COMMUNITY)
Admission: RE | Admit: 2022-02-21 | Discharge: 2022-02-21 | Disposition: A | Discharge status: Home / Self Care | Payer: Medicare Other | Source: Ambulatory Visit | Attending: Cardiology | Admitting: Cardiology

## 2022-02-21 DIAGNOSIS — R072 Precordial pain: Secondary | ICD-10-CM | POA: Insufficient documentation

## 2022-02-21 DIAGNOSIS — C50919 Malignant neoplasm of unspecified site of unspecified female breast: Secondary | ICD-10-CM

## 2022-02-21 MED ORDER — NITROGLYCERIN 0.4 MG SL SUBL
SUBLINGUAL_TABLET | SUBLINGUAL | Status: AC
  Filled 2022-02-21: qty 2

## 2022-02-21 MED ORDER — NITROGLYCERIN 0.4 MG SL SUBL
0.8000 mg | SUBLINGUAL_TABLET | Freq: Once | SUBLINGUAL | Status: AC
  Administered 2022-02-21: 0.8 mg via SUBLINGUAL

## 2022-02-21 MED ORDER — IOHEXOL 350 MG/ML SOLN
95.0000 mL | Freq: Once | INTRAVENOUS | Status: AC | PRN
  Administered 2022-02-21: 95 mL via INTRAVENOUS

## 2022-02-22 ENCOUNTER — Inpatient Hospital Stay: Payer: Medicare Other

## 2022-02-22 ENCOUNTER — Encounter: Payer: Self-pay | Admitting: Genetic Counselor

## 2022-02-22 ENCOUNTER — Inpatient Hospital Stay: Payer: Medicare Other | Attending: Family | Admitting: Genetic Counselor

## 2022-02-22 DIAGNOSIS — Z853 Personal history of malignant neoplasm of breast: Secondary | ICD-10-CM | POA: Diagnosis not present

## 2022-02-22 DIAGNOSIS — Z170 Estrogen receptor positive status [ER+]: Secondary | ICD-10-CM | POA: Diagnosis not present

## 2022-02-22 DIAGNOSIS — C50919 Malignant neoplasm of unspecified site of unspecified female breast: Secondary | ICD-10-CM

## 2022-02-22 LAB — GENETIC SCREENING ORDER

## 2022-02-22 NOTE — Telephone Encounter (Signed)
Dr. Geraldo Pitter,  I reviewed her CT scan performed yesterday and reached out to the patient. She is still having DOE, no chest pain. Do you recommend waiting until her echo on 03/04/22 to proceed with breast cancer surgery or can she go ahead and schedule surgery?

## 2022-02-22 NOTE — Progress Notes (Signed)
REFERRING PROVIDER: Donnie Mesa, MD [address] [place],  [place] [address]  PRIMARY PROVIDER:  Debbrah Alar, NP  PRIMARY REASON FOR VISIT:  1. 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 Hx of breast cancer      HISTORY OF PRESENT ILLNESS:   Ms. Dickison, a 61 y.o. female, was seen for a [facility] cancer genetics consultation at the request of Dr. Georgette Dover due to a personal history of breast cancer.  Ms. Earll presents to clinic today to discuss the possibility of a hereditary predisposition to cancer, genetic testing, and to further clarify her future cancer risks, as well as potential cancer risks for family members.   In 2004, at the age of 67, Ms. Janus was diagnosed with cancer of the right breast. The treatment plan was a lumpectomy.  In 2019, at the age of 49, Ms. Fonner was diagnosed with two different breast cancers in her left breast.  This was treated with a unilateral mastectomy.  In November 2023, at age 44, Ms. Godbee was diagnosed with a fourth breast cancer in her right breast.  She states that her plan is to have a mastectomy.      CANCER HISTORY:  Oncology History   No history exists.     RISK FACTORS:  Menarche was at age 63-10.  First live birth at age 73.  OCP use for approximately 8 years.  Ovaries intact: yes.  Hysterectomy: no.  Menopausal status: postmenopausal.  HRT use: 0 years. Colonoscopy: yes; normal. Mammogram within the last year: yes. Number of breast biopsies: 3. Up to date with pelvic exams: yes. Any excessive radiation exposure in the past: no  Past Medical History:  Diagnosis Date   Aortic atherosclerosis ([facility]) 03/13/2020   Atypical chest pain 02/13/2019   Breast cancer ([facility])    BIL  mastectomy left    lumpectomy right   DOE (dyspnea on exertion) 12/29/2021   Emphysema lung ([place]) 03/13/2020   Glaucoma     Heart murmur    History of alcohol abuse    History of drug abuse ([place])    Hx of breast cancer 02/13/2019   Right (2004) pT1cN1 cM0 triple negative s/p lumpectomy and ALND with adjuvant ddAc-T and radiation Left (2018) - Stage IIA cT2N0M0 ER-/PR-/Her2+ and Stage IA CT1CN0 ER-/PR-/Her2- both ypT1aN0(sn) grade 3 - s/p 6C neoadjuvant chemo, and mastectomy and SLNB on 09/15/2017 (records scanned)   Lung mass 02/13/2019   Open-angle glaucoma 02/13/2019   Personal history of chemotherapy    Personal history of radiation therapy    Personal history of tobacco use, presenting hazards to health 03/12/2019   S/P left mastectomy 02/13/2019   Tobacco use 02/13/2019    Past Surgical History:  Procedure Laterality Date   BREAST BIOPSY     BREAST BIOPSY Right 01/31/2022   MM RT BREAST BX W LOC DEV 1ST LESION IMAGE BX SPEC STEREO GUIDE 01/31/2022 [facility]   BREAST LUMPECTOMY Right 2004   triple Neg breast CA   MASTECTOMY Left 08/2017   TUBAL LIGATION  1991    Social History   Socioeconomic History   Marital status: Married    Spouse name: Not on file   Number of children: 2   Years of education: associates   Highest education level: Not on file  Occupational History   Not on file  Tobacco Use   Smoking status: Every Day  Packs/day: 0.75    Years: 42.00    Total pack years: 31.50    Types: Cigarettes   Smokeless tobacco: Never  Vaping Use   Vaping Use: Never used  Substance and Sexual Activity   Alcohol use: Not Currently    Comment: quit in 1999   Drug use: Not Currently    Types: Marijuana, Cocaine    Comment: quit in 1996-street drugs   Sexual activity: Not Currently  Other Topics Concern   Not on file  Social History Narrative   02/13/19   From: Born in [place] but grew up in [place]: with husband   Work: currently on disability      Family: Daughter - Sales executive (biological daughter) Son - Vicente Males (biological son) has one step son in [place]       Enjoys: walking, picnics, bowling, reading, playing cards      Exercise: walking - tries to do everyday - uses fitbit and tries to get 5000 steps   Diet: veggies, water, 2-3 cups of coffee, trying to monitor what she eats      Safety   Seat belts: Yes    Guns: No   Safe in relationships: Yes    Social Determinants of Health   Financial Resource Strain: Medium Risk (02/13/2019)   Overall Financial Resource Strain (CARDIA)    Difficulty of Paying Living Expenses: Somewhat hard  Food Insecurity: Not on file  Transportation Needs: Not on file  Physical Activity: Not on file  Stress: Not on file  Social Connections: Not on file     FAMILY HISTORY:  We obtained a detailed, 4-generation family history.  Significant diagnoses are listed below: Family History  Problem Relation Age of Onset   Alcohol abuse Mother    Lung cancer Mother    Esophageal cancer Mother    Throat cancer Mother    Alcohol abuse Father    Arthritis Father    Kidney disease Father    Dementia Father    Arthritis Sister    Diabetes Sister    Arthritis Brother    Hypertension Brother    Sickle cell anemia Brother    HIV Brother    Heart attack Brother 73       massive MI   Thyroid disease Brother      The patient has a son and daughter who are cancer free. She has one full brother who died of a heart attack at 28.  She has three maternal half brothers and sister who are cancer free and two paternal half brothers who are cancer free.  Both parents are deceased.  The patient's mother died of lung and esophageal cancer. She has two brothers and two sisters who are cancer free.  Her parents died of old age.  The patient's father died with dementia. The patient' has limited information on his side of the family. He had five siblings who are cancer free and his parents are deceased from non-cancer related issues.   Ms. Messinger is unaware of previous family history of genetic testing for hereditary cancer  risks. Patient's maternal ancestors are of African American descent, and paternal ancestors are of African American descent. There is no reported Ashkenazi Jewish ancestry. There is no known consanguinity.  GENETIC COUNSELING ASSESSMENT: Ms. Manfredi is a 61 y.o. female with a personal history of breast cancer which is somewhat suggestive of a hereditary cancer syndrome and predisposition to cancer given her early age of diagnosis and the multiple diagnosis.  We, therefore, discussed and recommended the following at today's visit.   DISCUSSION: We discussed that, in general, most cancer is not inherited in families, but instead is sporadic or familial. Sporadic cancers occur by chance and typically happen at older ages (>50 years) as this type of cancer is caused by genetic changes acquired during an individual's lifetime. Some families have more cancers than would be expected by chance; however, the ages or types of cancer are not consistent with a known genetic mutation or known genetic mutations have been ruled out. This type of familial cancer is thought to be due to a combination of multiple genetic, environmental, hormonal, and lifestyle factors. While this combination of factors likely increases the risk of cancer, the exact source of this risk is not currently identifiable or testable.  We discussed that 5 - 10% of breast cancer is hereditary, with most cases associated with BRCA mutations.  There are other genes that can be associated with hereditary breast cancer syndromes.  These include ATM, CHEK2 and PALB2.  We discussed that testing is beneficial for several reasons including knowing how to follow individuals after completing their treatment, identifying whether potential treatment options such as PARP inhibitors would be beneficial, and understand if other family members could be at risk for cancer and allow them to undergo genetic testing.   We discussed that some people do not want to  undergo genetic testing due to fear of genetic discrimination.  A federal law called the Genetic Information Non-Discrimination Act (GINA) of 2008 helps protect individuals against genetic discrimination based on their genetic test results.  It impacts both health insurance and employment.  With health insurance, it protects against increased premiums, being kicked off insurance or being forced to take a test in order to be insured.  For employment it protects against hiring, firing and promoting decisions based on genetic test results.  GINA does not apply to those in the [employer], those who work for companies with less than 15 employees, and new life insurance or long-term disability insurance policies.  Health status due to a cancer diagnosis is not protected under GINA.   We reviewed the characteristics, features and inheritance patterns of hereditary cancer syndromes. We also discussed genetic testing, including the appropriate family members to test, the process of testing, insurance coverage and turn-around-time for results. We discussed the implications of a negative, positive, carrier and/or variant of uncertain significant result. Ms. Raether  was offered a common hereditary cancer panel (47 genes) and an expanded pan-cancer panel (77 genes). Ms. Harron was informed of the benefits and limitations of each panel, including that expanded pan-cancer panels contain genes that do not have clear management guidelines at this point in time.  We also discussed that as the number of genes included on a panel increases, the chances of variants of uncertain significance increases. Ms. Shaft decided to pursue genetic testing for the CancerNext-Expanded+RNAinsight gene panel.   The CancerNext-Expanded gene panel offered by [facility] and includes sequencing and rearrangement analysis for the following 77 genes: AIP, ALK, APC*, ATM*, AXIN2, BAP1, BARD1, BLM, BMPR1A, BRCA1*, BRCA2*, BRIP1*, CDC73,  CDH1*, CDK4, CDKN1B, CDKN2A, CHEK2*, CTNNA1, DICER1, FANCC, FH, FLCN, GALNT12, KIF1B, LZTR1, MAX, MEN1, MET, MLH1*, MSH2*, MSH3, MSH6*, MUTYH*, NBN, NF1*, NF2, NTHL1, PALB2*, PHOX2B, PMS2*, POT1, PRKAR1A, PTCH1, PTEN*, RAD51C*, RAD51D*, RB1, RECQL, RET, SDHA, SDHAF2, SDHB, SDHC, SDHD, SMAD4, SMARCA4, SMARCB1, SMARCE1, STK11, SUFU, TMEM127, TP53*, TSC1, TSC2, VHL and XRCC2 (sequencing and deletion/duplication); EGFR, EGLN1, HOXB13, KIT, MITF, PDGFRA,  POLD1, and POLE (sequencing only); EPCAM and GREM1 (deletion/duplication only). DNA and RNA analyses performed for * genes.   Based on Ms. Hehn's personal history of cancer, she meets medical criteria for genetic testing. Despite that she meets criteria, she may still have an out of pocket cost. We discussed that if her out of pocket cost for testing is over $100, the laboratory will call and confirm whether she wants to proceed with testing.  If the out of pocket cost of testing is less than $100 she will be billed by the genetic testing laboratory.   PLAN: After considering the risks, benefits, and limitations, Ms. Crayton provided informed consent to pursue genetic testing and the blood sample was sent to [facility] for analysis of the CancerNext-Expanded+RNAinsight. Results should be available within approximately 2-3 weeks' time, at which point they will be disclosed by telephone to Ms. Shippee, as will any additional recommendations warranted by these results. Ms. Boudreau will receive a summary of her genetic counseling visit and a copy of her results once available. This information will also be available in Epic.   Lastly, we encouraged Ms. Holtrop to remain in contact with cancer genetics annually so that we can continuously update the family history and inform her of any changes in cancer genetics and testing that may be of benefit for this family.   Ms. Bostwick questions were answered to her satisfaction today.  Our contact information was provided should additional questions or concerns arise. Thank you for the referral and allowing [place] to share in the care of your patient.   Kasra Melvin P. Florene Glen, [place], [facility], Insurance risk surveyor [email or web address] phone: [phone]  The patient was seen for a total of 45 minutes in face-to-face genetic counseling.  The patient was seen alone.  Drs. Michell Heinrich, and/or [facility] were available for questions, if needed..    _______________________________________________________________________ For Office Staff:  Number of people involved in session: 1 Was an Intern/ student involved with case: no

## 2022-02-23 NOTE — Telephone Encounter (Signed)
   Patient Name: Jaime [name]  DOB: 1960/08/15 MRN: [id number]  Primary Cardiologist: Jenean Lindau, MD  Chart reviewed as part of pre-operative protocol coverage. Given past medical history and time since last visit, based on ACC/AHA guidelines, LOIE JAHR is at acceptable risk for the planned procedure without further cardiovascular testing.   She does have echocardiogram upcoming however per Dr. Geraldo Pitter given coronary calcium score of 0 she is low risk and may proceed with surgery prior to echocardiogram.   May hold Aspirin 5-7 days prior to planned procedure.    I will route this recommendation to the requesting party via Epic fax function and remove from pre-op pool.  Please call with questions.  Loel Dubonnet, NP 02/23/2022, 9:24 AM

## 2022-02-25 ENCOUNTER — Ambulatory Visit: Payer: Self-pay | Admitting: Surgery

## 2022-03-02 ENCOUNTER — Inpatient Hospital Stay: Payer: Medicare Other | Attending: Family

## 2022-03-02 DIAGNOSIS — Z853 Personal history of malignant neoplasm of breast: Secondary | ICD-10-CM | POA: Diagnosis not present

## 2022-03-02 DIAGNOSIS — C50919 Malignant neoplasm of unspecified site of unspecified female breast: Secondary | ICD-10-CM

## 2022-03-02 DIAGNOSIS — Z452 Encounter for adjustment and management of vascular access device: Secondary | ICD-10-CM | POA: Diagnosis not present

## 2022-03-02 MED ORDER — SODIUM CHLORIDE 0.9% FLUSH
10.0000 mL | Freq: Once | INTRAVENOUS | Status: AC
  Administered 2022-03-02: 10 mL via INTRAVENOUS

## 2022-03-02 MED ORDER — HEPARIN SOD (PORK) LOCK FLUSH 100 UNIT/ML IV SOLN
500.0000 [IU] | Freq: Once | INTRAVENOUS | Status: AC
  Administered 2022-03-02: 500 [IU] via INTRAVENOUS

## 2022-03-02 NOTE — Pre-Procedure Instructions (Signed)
Surgical Instructions    Your procedure is scheduled on Tuesday, December 12.  Report to [facility] Main Entrance "A" at 9:00 A.M., then check in with the Admitting office.  Call this number if you have problems the morning of surgery:  [phone]   If you have any questions prior to your surgery date call [phone]: Open Monday-Friday 8am-4pm If you experience any cold or flu symptoms such as cough, fever, chills, shortness of breath, etc. between now and your scheduled surgery, please notify us at the above number     Remember:  Do not eat after midnight the night before your surgery  You may drink clear liquids until 8:00AM the morning of your surgery.   Clear liquids allowed are: Water, Non-Citrus Juices (without pulp), Carbonated Beverages, Clear Tea, Black Coffee ONLY (NO MILK, CREAM OR POWDERED CREAMER of any kind), and Gatorade    Take these medicines the morning of surgery with A SIP OF WATER: latanoprost (XALATAN) 0.005 % ophthalmic solution   Follow your surgeon's instructions on when to stop Aspirin.  If no instructions were given by your surgeon then you will need to call the office to get those instructions.    As of today, STOP taking any Aleve, Naproxen, Ibuprofen, Motrin, Advil, Goody's, BC's, all herbal medications, fish oil, and all vitamins.            [facility] is not responsible for any belongings or valuables.    Do NOT Smoke (Tobacco/Vaping)  24 hours prior to your procedure  If you use a CPAP at night, you may bring your mask for your overnight stay.   Contacts, glasses, hearing aids, dentures or partials may not be worn into surgery, please bring cases for these belongings   For patients admitted to the hospital, discharge time will be determined by your treatment team.   Patients discharged the day of surgery will not be allowed to drive home, and someone needs to stay with them for 24 hours.   SURGICAL WAITING ROOM VISITATION Patients having  surgery or a procedure may have no more than 2 support people in the waiting area - these visitors may rotate.   Children under the age of 32 must have an adult with them who is not the patient. If the patient needs to stay at the hospital during part of their recovery, the visitor guidelines for inpatient rooms apply. Pre-op nurse will coordinate an appropriate time for 1 support person to accompany patient in pre-op.  This support person may not rotate.   Please refer to [email or web address] for the visitor guidelines for Inpatients (after your surgery is over and you are in a regular room).    Special instructions:    Oral Hygiene is also important to reduce your risk of infection.  Remember - BRUSH YOUR TEETH THE MORNING OF SURGERY WITH YOUR REGULAR TOOTHPASTE   [facility]- Preparing For Surgery  Before surgery, you can play an important role. Because skin is not sterile, your skin needs to be as free of germs as possible. You can reduce the number of germs on your skin by washing with CHG (chlorahexidine gluconate) Soap before surgery.  CHG is an antiseptic cleaner which kills germs and bonds with the skin to continue killing germs even after washing.     Please do not use if you have an allergy to CHG or antibacterial soaps. If your skin becomes reddened/irritated stop using the CHG.  Do not shave (including legs and underarms) for  at least 48 hours prior to first CHG shower. It is OK to shave your face.  Please follow these instructions carefully.     Shower the NIGHT BEFORE SURGERY and the MORNING OF SURGERY with CHG Soap.   If you chose to wash your hair, wash your hair first as usual with your normal shampoo. After you shampoo, rinse your hair and body thoroughly to remove the shampoo.  Then [employer] and genitals (private parts) with your normal soap and rinse thoroughly to remove soap.  After that Use CHG Soap as you would any  other liquid soap. You can apply CHG directly to the skin and wash gently with a scrungie or a clean washcloth.   Apply the CHG Soap to your body ONLY FROM THE NECK DOWN.  Do not use on open wounds or open sores. Avoid contact with your eyes, ears, mouth and genitals (private parts). Wash Face and genitals (private parts)  with your normal soap.   Wash thoroughly, paying special attention to the area where your surgery will be performed.  Thoroughly rinse your body with warm water from the neck down.  DO NOT shower/wash with your normal soap after using and rinsing off the CHG Soap.  Pat yourself dry with a CLEAN TOWEL.  Wear CLEAN PAJAMAS to bed the night before surgery  Place CLEAN SHEETS on your bed the night before your surgery  DO NOT SLEEP WITH PETS.   Day of Surgery:  Take a shower with CHG soap. Wear Clean/Comfortable clothing the morning of surgery Do not wear jewelry or makeup. Do not wear lotions, powders, perfumes/cologne or deodorant. Do not shave 48 hours prior to surgery.  Men may shave face and neck. Do not bring valuables to the hospital. Do not wear nail polish, gel polish, artificial nails, or any other type of covering on natural nails (fingers and toes) If you have artificial nails or gel coating that need to be removed by a nail salon, please have this removed prior to surgery. Artificial nails or gel coating may interfere with anesthesia's ability to adequately monitor your vital signs.  Remember to brush your teeth WITH YOUR REGULAR TOOTHPASTE.    If you received a COVID test during your pre-op visit, it is requested that you wear a mask when out in public, stay away from anyone that may not be feeling well, and notify your surgeon if you develop symptoms. If you have been in contact with anyone that has tested positive in the last 10 days, please notify your surgeon.    Please read over the following fact sheets that you were given.

## 2022-03-02 NOTE — Patient Instructions (Signed)

## 2022-03-03 ENCOUNTER — Other Ambulatory Visit: Payer: Medicare Other

## 2022-03-03 ENCOUNTER — Encounter (HOSPITAL_COMMUNITY): Payer: Self-pay

## 2022-03-03 ENCOUNTER — Telehealth: Payer: Self-pay | Admitting: Genetic Counselor

## 2022-03-03 ENCOUNTER — Encounter (HOSPITAL_COMMUNITY)
Admission: RE | Admit: 2022-03-03 | Discharge: 2022-03-03 | Disposition: A | Discharge status: Home / Self Care | Payer: Medicare Other | Source: Ambulatory Visit | Attending: Surgery | Admitting: Surgery

## 2022-03-03 ENCOUNTER — Other Ambulatory Visit: Payer: Self-pay

## 2022-03-03 ENCOUNTER — Ambulatory Visit: Payer: Self-pay | Admitting: Genetic Counselor

## 2022-03-03 ENCOUNTER — Encounter: Payer: Self-pay | Admitting: Genetic Counselor

## 2022-03-03 VITALS — BP 136/73 | HR 72 | Temp 97.6°F | Resp 18 | Ht 60.0 in | Wt 230.9 lb

## 2022-03-03 DIAGNOSIS — Z1379 Encounter for other screening for genetic and chromosomal anomalies: Secondary | ICD-10-CM

## 2022-03-03 DIAGNOSIS — Z01812 Encounter for preprocedural laboratory examination: Secondary | ICD-10-CM | POA: Insufficient documentation

## 2022-03-03 DIAGNOSIS — Z01818 Encounter for other preprocedural examination: Secondary | ICD-10-CM

## 2022-03-03 HISTORY — DX: Sleep apnea, unspecified: G47.30

## 2022-03-03 HISTORY — DX: Nontoxic single thyroid nodule: E04.1

## 2022-03-03 HISTORY — DX: Encounter for other screening for genetic and chromosomal anomalies: Z13.79

## 2022-03-03 LAB — CBC
HCT: 40.5 % (ref 36.0–46.0)
Hemoglobin: 13.3 g/dL (ref 12.0–15.0)
MCH: 28.5 pg (ref 26.0–34.0)
MCHC: 32.8 g/dL (ref 30.0–36.0)
MCV: 86.9 fL (ref 80.0–100.0)
Platelets: 285 10*3/uL (ref 150–400)
RBC: 4.66 MIL/uL (ref 3.87–5.11)
RDW: 13 % (ref 11.5–15.5)
WBC: 4.7 10*3/uL (ref 4.0–10.5)
nRBC: 0 % (ref 0.0–0.2)

## 2022-03-03 NOTE — Progress Notes (Signed)
PCP - Debbrah Alar, NP Cardiologist - Dr. Jyl Heinz  PPM/ICD - denies  Chest x-ray - denies EKG - 02/03/22 Stress Test - denies ECHO - pt is scheduled for an ECHO on 03/04/22 Cardiac Cath - denies  Sleep Study - 10+ years ago per pt, OSA+ mild per pt, no CPAP recommended CPAP - denies  DM- denies  Last dose of GLP1 agonist-  n/a   Blood Thinner Instructions: n/a Aspirin Instructions: f/u with surgeon  ERAS Protcol - yes PRE-SURGERY Ensure given at PAT  COVID TEST- n/a   Anesthesia review: yes, cardiac hx, pt is scheduled for an ECHO on 03/04/22  Patient denies shortness of breath, fever, cough and chest pain at PAT appointment   All instructions explained to the patient, with a verbal understanding of the material. Patient agrees to go over the instructions while at home for a better understanding.  The opportunity to ask questions was provided.

## 2022-03-03 NOTE — Progress Notes (Signed)
HPI:  Ms. Cullins was previously seen in the [place] clinic due to a personal history of breast cancer and concerns regarding a hereditary predisposition to cancer. Please refer to our prior cancer genetics clinic note for more information regarding our discussion, assessment and recommendations, at the time. Ms. Ramaker recent genetic test results were disclosed to her, as were recommendations warranted by these results. These results and recommendations are discussed in more detail below.  CANCER HISTORY:  Oncology History  Breast cancer ([place])  01/21/2022 Initial Diagnosis   Breast cancer ([place])   03/02/2022 Genetic Testing   Negative genetic testing on the CancerNext-Expanded+RNAinsight panel.  The report date is March 02, 2022.  The CancerNext-Expanded gene panel offered by [facility] and includes sequencing and rearrangement analysis for the following 77 genes: AIP, ALK, APC*, ATM*, AXIN2, BAP1, BARD1, BLM, BMPR1A, BRCA1*, BRCA2*, BRIP1*, CDC73, CDH1*, CDK4, CDKN1B, CDKN2A, CHEK2*, CTNNA1, DICER1, FANCC, FH, FLCN, GALNT12, KIF1B, LZTR1, MAX, MEN1, MET, MLH1*, MSH2*, MSH3, MSH6*, MUTYH*, NBN, NF1*, NF2, NTHL1, PALB2*, PHOX2B, PMS2*, POT1, PRKAR1A, PTCH1, PTEN*, RAD51C*, RAD51D*, RB1, RECQL, RET, SDHA, SDHAF2, SDHB, SDHC, SDHD, SMAD4, SMARCA4, SMARCB1, SMARCE1, STK11, SUFU, TMEM127, TP53*, TSC1, TSC2, VHL and XRCC2 (sequencing and deletion/duplication); EGFR, EGLN1, HOXB13, KIT, MITF, PDGFRA, POLD1, and POLE (sequencing only); EPCAM and GREM1 (deletion/duplication only). DNA and RNA analyses performed for * genes.      FAMILY HISTORY:  We obtained a detailed, 4-generation family history.  Significant diagnoses are listed below: Family History  Problem Relation Age of Onset   Alcohol abuse Mother    Lung cancer Mother    Esophageal cancer Mother    Throat cancer Mother    Alcohol abuse Father    Arthritis Father    Kidney disease Father    Dementia Father     Arthritis Sister    Diabetes Sister    Arthritis Brother    Hypertension Brother    Sickle cell anemia Brother    HIV Brother    Heart attack Brother 64       massive MI   Thyroid disease Brother        The patient has a son and daughter who are cancer free. She has one full brother who died of a heart attack at 40.  She has three maternal half brothers and sister who are cancer free and two paternal half brothers who are cancer free.  Both parents are deceased.   The patient's mother died of lung and esophageal cancer. She has two brothers and two sisters who are cancer free.  Her parents died of old age.   The patient's father died with dementia. The patient' has limited information on his side of the family. He had five siblings who are cancer free and his parents are deceased from non-cancer related issues.     Ms. Mainwaring is unaware of previous family history of genetic testing for hereditary cancer risks. Patient's maternal ancestors are of African American descent, and paternal ancestors are of African American descent. There is no reported Ashkenazi Jewish ancestry. There is no known consanguinity.  GENETIC TEST RESULTS: Genetic testing reported out on March 02, 2022 through the CancerNext-Expanded+RNAinsight cancer panel found no pathogenic mutations. The CancerNext-Expanded gene panel offered by [facility] and includes sequencing and rearrangement analysis for the following 77 genes: AIP, ALK, APC*, ATM*, AXIN2, BAP1, BARD1, BLM, BMPR1A, BRCA1*, BRCA2*, BRIP1*, CDC73, CDH1*, CDK4, CDKN1B, CDKN2A, CHEK2*, CTNNA1, DICER1, FANCC, FH, FLCN, GALNT12, KIF1B, LZTR1, MAX, MEN1, MET,  MLH1*, MSH2*, MSH3, MSH6*, MUTYH*, NBN, NF1*, NF2, NTHL1, PALB2*, PHOX2B, PMS2*, POT1, PRKAR1A, PTCH1, PTEN*, RAD51C*, RAD51D*, RB1, RECQL, RET, SDHA, SDHAF2, SDHB, SDHC, SDHD, SMAD4, SMARCA4, SMARCB1, SMARCE1, STK11, SUFU, TMEM127, TP53*, TSC1, TSC2, VHL and XRCC2 (sequencing and  deletion/duplication); EGFR, EGLN1, HOXB13, KIT, MITF, PDGFRA, POLD1, and POLE (sequencing only); EPCAM and GREM1 (deletion/duplication only). DNA and RNA analyses performed for * genes. The test report has been scanned into EPIC and is located under the Molecular Pathology section of the Results Review tab.  A portion of the result report is included below for reference.     We discussed with Ms. Freshour that because current genetic testing is not perfect, it is possible there may be a gene mutation in one of these genes that current testing cannot detect, but that chance is small.  We also discussed, that there could be another gene that has not yet been discovered, or that we have not yet tested, that is responsible for the cancer diagnoses in the family. It is also possible there is a hereditary cause for the cancer in the family that Ms. Espino did not inherit and therefore was not identified in her testing.  Therefore, it is important to remain in touch with cancer genetics in the future so that we can continue to offer Ms. Ramella the most up to date genetic testing.   ADDITIONAL GENETIC TESTING: We discussed with Ms. Collymore that her genetic testing was fairly extensive.  If there are genes identified to increase cancer risk that can be analyzed in the future, we would be happy to discuss and coordinate this testing at that time.    CANCER SCREENING RECOMMENDATIONS: Ms. Huck test result is considered negative (normal).  This means that we have not identified a hereditary cause for her personal history of breast cancer at this time. Most cancers happen by chance and this negative test suggests that her cancer may fall into this category.    While reassuring, this does not definitively rule out a hereditary predisposition to cancer. It is still possible that there could be genetic mutations that are undetectable by current technology. There could be genetic mutations in genes  that have not been tested or identified to increase cancer risk.  Therefore, it is recommended she continue to follow the cancer management and screening guidelines provided by her oncology and primary healthcare provider.   An individual's cancer risk and medical management are not determined by genetic test results alone. Overall cancer risk assessment incorporates additional factors, including personal medical history, family history, and any available genetic information that may result in a personalized plan for cancer prevention and surveillance  RECOMMENDATIONS FOR FAMILY MEMBERS:  Individuals in this family might be at some increased risk of developing cancer, over the general population risk, simply due to the family history of cancer.  We recommended women in this family have a yearly mammogram beginning at age 31, or 28 years younger than the earliest onset of cancer, an annual clinical breast exam, and perform monthly breast self-exams. Women in this family should also have a gynecological exam as recommended by their primary provider. All family members should be referred for colonoscopy starting at age 19.  FOLLOW-UP: Lastly, we discussed with Ms. Strickland that cancer genetics is a rapidly advancing field and it is possible that new genetic tests will be appropriate for her and/or her family members in the future. We encouraged her to remain in contact with cancer genetics on an annual basis  so we can update her personal and family histories and let her know of advances in cancer genetics that may benefit this family.   Our contact number was provided. Ms. Baskins questions were answered to her satisfaction, and she knows she is welcome to call us at anytime with additional questions or concerns.   Roma Kayser, [place], [facility] Licensed, Certified Genetic Counselor [email or web address]

## 2022-03-03 NOTE — Telephone Encounter (Signed)
Revealed negative genetic testing.  Discussed that we do not know why she has breast cancer several times. It could be due to a different gene that we are not testing, or maybe our current technology may not be able to pick something up.  It will be important for her to keep in contact with genetics to keep up with whether additional testing may be needed.

## 2022-03-04 ENCOUNTER — Ambulatory Visit (HOSPITAL_BASED_OUTPATIENT_CLINIC_OR_DEPARTMENT_OTHER)
Admission: RE | Admit: 2022-03-04 | Discharge: 2022-03-04 | Disposition: A | Discharge status: Home / Self Care | Payer: Medicare Other | Source: Ambulatory Visit | Attending: Cardiology | Admitting: Cardiology

## 2022-03-04 DIAGNOSIS — I700 Atherosclerosis of aorta: Secondary | ICD-10-CM | POA: Diagnosis present

## 2022-03-04 DIAGNOSIS — R0609 Other forms of dyspnea: Secondary | ICD-10-CM | POA: Insufficient documentation

## 2022-03-04 DIAGNOSIS — R072 Precordial pain: Secondary | ICD-10-CM | POA: Diagnosis not present

## 2022-03-04 MED ORDER — PERFLUTREN LIPID MICROSPHERE
1.0000 mL | INTRAVENOUS | Status: AC | PRN
  Administered 2022-03-04: 3 mL via INTRAVENOUS

## 2022-03-05 LAB — ECHOCARDIOGRAM COMPLETE
AR max vel: 2.1 cm2
AV Area VTI: 2.22 cm2
AV Area mean vel: 2.13 cm2
AV Mean grad: 3 mmHg
AV Peak grad: 6.4 mmHg
Ao pk vel: 1.26 m/s
Area-P 1/2: 3.28 cm2
P 1/2 time: 506 msec

## 2022-03-07 NOTE — Anesthesia Preprocedure Evaluation (Addendum)
Anesthesia Evaluation  Patient identified by MRN, date of birth, ID band Patient awake    Reviewed: Allergy & Precautions, NPO status , Patient's Chart, lab work & pertinent test results  History of Anesthesia Complications Negative for: history of anesthetic complications  Airway Mallampati: II  TM Distance: >3 FB Neck ROM: Full    Dental no notable dental hx.    Pulmonary neg shortness of breath, sleep apnea (does not use CPAP) , COPD (does not use inhalers), neg recent URI, Current SmokerPatient did not abstain from smoking. Lung mass   Pulmonary exam normal breath sounds clear to auscultation       Cardiovascular (-) hypertension(-) angina + DOE  (-) Past MI, (-) Cardiac Stents and (-) CABG + Valvular Problems/Murmurs (mild) MR  Rhythm:Regular Rate:Normal  TTE 03/04/2022: IMPRESSIONS     1. Left ventricular ejection fraction, by estimation, is 55 to 60%. The  left ventricle has normal function. The left ventricle has no regional  wall motion abnormalities. Left ventricular diastolic parameters are  consistent with Grade I diastolic  dysfunction (impaired relaxation).   2. Right ventricular systolic function is normal. The right ventricular  size is normal.   3. Left atrial size was mildly dilated.   4. The mitral valve is normal in structure. Mild mitral valve  regurgitation. No evidence of mitral stenosis.   5. The aortic valve is normal in structure. Aortic valve regurgitation is  mild. No aortic stenosis is present.   6. The inferior vena cava is normal in size with greater than 50%  respiratory variability, suggesting right atrial pressure of 3 mmHg.   Coronary CT 02/21/22: IMPRESSION: 1. No evidence of CAD, CADRADS = 0.   2. Coronary calcium score of 0. This was 0 percentile for age and sex matched control.   3. Normal coronary origin with left dominance.   4. Tunneled port with tip in the right SVC.   5.  Consider non-coronary causes of chest pain     Neuro/Psych negative neurological ROS     GI/Hepatic negative GI ROS,,,(+)     substance abuse (no alcohol since 1999, no drugs since 1996)    Endo/Other  neg diabetes  Morbid obesityRight thyroid nodule  Renal/GU negative Renal ROS     Musculoskeletal   Abdominal  (+) + obese  Peds  Hematology negative hematology ROS (+)   Anesthesia Other Findings Breast cancer  Reproductive/Obstetrics                             Anesthesia Physical Anesthesia Plan  ASA: 3  Anesthesia Plan: General   Post-op Pain Management: Regional block*   Induction: Intravenous  PONV Risk Score and Plan: 2 and Ondansetron, Propofol infusion and Treatment may vary due to age or medical condition  Airway Management Planned: LMA  Additional Equipment:   Intra-op Plan:   Post-operative Plan: Extubation in OR  Informed Consent: I have reviewed the patients History and Physical, chart, labs and discussed the procedure including the risks, benefits and alternatives for the proposed anesthesia with the patient or authorized representative who has indicated his/her understanding and acceptance.     Dental advisory given  Plan Discussed with: CRNA and Anesthesiologist  Anesthesia Plan Comments: (Patient reports she has never had lymphedema in her arms. Since surgery is on right side, will plan to place PIV on LUE.  Discussed potential risks of nerve blocks including, but not limited to, infection, bleeding,  nerve damage, seizures, pneumothorax, respiratory depression, and potential failure of the block. Alternatives to nerve blocks discussed. All questions answered.  Risks of general anesthesia discussed including, but not limited to, sore throat, hoarse voice, chipped/damaged teeth, injury to vocal cords, nausea and vomiting, allergic reactions, lung infection, heart attack, stroke, and death. All questions  answered.   PAT note by Antionette Poles, PA-C: Recent cardiology evaluation for complaint of DOE.  Coronary CT showed no evidence of CAD, coronary calcium score 0.  She was cleared to proceed with surgery per telephone encounter 02/23/2022 stating, "Chart reviewed as part of pre-operative protocol coverage. Given past medical history and time since last visit, based on ACC/AHA guidelines,Jaime C Cunninghamis at acceptable risk for the planned procedure without further cardiovascular testing.  She does have echocardiogram upcoming however per Dr. Tomie China given coronary calcium score of 0 she is low risk and may proceed with surgery prior to echocardiogram. May hold Aspirin 5-7 days prior to planned procedure."  Patient did subsequently have echo 03/04/2022 which showed normal systolic function, grade 1 DD, mild MR.  History of bilateral breast cancer s/p right lumpectomy and sentinel node biopsy 2004 and left simple mastectomy 2019.  BMP 02/16/2022 reviewed, WNL.  CBC 03/03/2022 reviewed, WNL.  EKG 02/03/2022: NSR.  Rate 94.  Septal infarct, age undetermined.  TTE 03/04/22: 1. Left ventricular ejection fraction, by estimation, is 55 to 60%. The  left ventricle has normal function. The left ventricle has no regional  wall motion abnormalities. Left ventricular diastolic parameters are  consistent with Grade I diastolic  dysfunction (impaired relaxation).  2. Right ventricular systolic function is normal. The right ventricular  size is normal.  3. Left atrial size was mildly dilated.  4. The mitral valve is normal in structure. Mild mitral valve  regurgitation. No evidence of mitral stenosis.  5. The aortic valve is normal in structure. Aortic valve regurgitation is  mild. No aortic stenosis is present.  6. The inferior vena cava is normal in size with greater than 50%  respiratory variability, suggesting right atrial pressure of 3 mmHg.   Coronary CT 02/21/22: IMPRESSION: 1. No evidence  of CAD, CADRADS = 0.  2. Coronary calcium score of 0. This was 0 percentile for age and sex matched control.  3. Normal coronary origin with left dominance.  4. Tunneled port with tip in the right SVC.  5. Consider non-coronary causes of chest pain.   )        Anesthesia Quick Evaluation

## 2022-03-07 NOTE — Progress Notes (Signed)
Anesthesia Chart Review:  Recent cardiology evaluation for complaint of DOE.  Coronary CT showed no evidence of CAD, coronary calcium score 0.  She was cleared to proceed with surgery per telephone encounter 02/23/2022 stating, "Chart reviewed as part of pre-operative protocol coverage. Given past medical history and time since last visit, based on ACC/AHA guidelines, Jaime [name] is at acceptable risk for the planned procedure without further cardiovascular testing.  She does have echocardiogram upcoming however per Dr. Geraldo Pitter given coronary calcium score of 0 she is low risk and may proceed with surgery prior to echocardiogram. May hold Aspirin 5-7 days prior to planned procedure."  Patient did subsequently have echo 03/04/2022 which showed normal systolic function, grade 1 DD, mild MR.  History of bilateral breast cancer s/p right lumpectomy and sentinel node biopsy 2004 and left simple mastectomy 2019.  BMP 02/16/2022 reviewed, WNL.  CBC 03/03/2022 reviewed, WNL.  EKG 02/03/2022: NSR.  Rate 94.  Septal infarct, age undetermined.  TTE 03/04/22: 1. Left ventricular ejection fraction, by estimation, is 55 to 60%. The  left ventricle has normal function. The left ventricle has no regional  wall motion abnormalities. Left ventricular diastolic parameters are  consistent with Grade I diastolic  dysfunction (impaired relaxation).   2. Right ventricular systolic function is normal. The right ventricular  size is normal.   3. Left atrial size was mildly dilated.   4. The mitral valve is normal in structure. Mild mitral valve  regurgitation. No evidence of mitral stenosis.   5. The aortic valve is normal in structure. Aortic valve regurgitation is  mild. No aortic stenosis is present.   6. The inferior vena cava is normal in size with greater than 50%  respiratory variability, suggesting right atrial pressure of 3 mmHg.   Coronary CT 02/21/22: IMPRESSION: 1. No evidence of CAD, CADRADS =  0.   2. Coronary calcium score of 0. This was 0 percentile for age and sex matched control.   3. Normal coronary origin with left dominance.   4. Tunneled port with tip in the right SVC.   5. Consider non-coronary causes of chest pain.   Wynonia Musty [facility] Short Stay Center/Anesthesiology Phone [phone] 03/07/2022 10:15 AM

## 2022-03-08 ENCOUNTER — Ambulatory Visit (HOSPITAL_COMMUNITY): Payer: Medicare Other | Admitting: Physician Assistant

## 2022-03-08 ENCOUNTER — Ambulatory Visit (HOSPITAL_BASED_OUTPATIENT_CLINIC_OR_DEPARTMENT_OTHER): Payer: Medicare Other | Admitting: Physician Assistant

## 2022-03-08 ENCOUNTER — Observation Stay (HOSPITAL_COMMUNITY)
Admission: RE | Admit: 2022-03-08 | Discharge: 2022-03-09 | Disposition: A | Discharge status: Home / Self Care | Payer: Medicare Other | Source: Ambulatory Visit | Attending: Surgery | Admitting: Surgery

## 2022-03-08 ENCOUNTER — Other Ambulatory Visit: Payer: Self-pay

## 2022-03-08 ENCOUNTER — Encounter (HOSPITAL_COMMUNITY)
Admission: RE | Disposition: A | Discharge status: Home / Self Care | Payer: Self-pay | Source: Ambulatory Visit | Attending: Surgery

## 2022-03-08 ENCOUNTER — Encounter (HOSPITAL_COMMUNITY): Payer: Self-pay | Admitting: Surgery

## 2022-03-08 DIAGNOSIS — D0511 Intraductal carcinoma in situ of right breast: Principal | ICD-10-CM | POA: Insufficient documentation

## 2022-03-08 DIAGNOSIS — F17210 Nicotine dependence, cigarettes, uncomplicated: Secondary | ICD-10-CM | POA: Diagnosis not present

## 2022-03-08 DIAGNOSIS — C50911 Malignant neoplasm of unspecified site of right female breast: Secondary | ICD-10-CM

## 2022-03-08 DIAGNOSIS — Z91040 Latex allergy status: Secondary | ICD-10-CM | POA: Insufficient documentation

## 2022-03-08 DIAGNOSIS — Z6839 Body mass index (BMI) 39.0-39.9, adult: Secondary | ICD-10-CM | POA: Diagnosis not present

## 2022-03-08 DIAGNOSIS — C50919 Malignant neoplasm of unspecified site of unspecified female breast: Secondary | ICD-10-CM

## 2022-03-08 HISTORY — DX: Malignant neoplasm of unspecified site of unspecified female breast: C50.919

## 2022-03-08 HISTORY — PX: TOTAL MASTECTOMY: SHX6129

## 2022-03-08 HISTORY — DX: Malignant neoplasm of unspecified site of right female breast: C50.911

## 2022-03-08 SURGERY — MASTECTOMY, SIMPLE
Anesthesia: General | Laterality: Right

## 2022-03-08 MED ORDER — CEFAZOLIN SODIUM-DEXTROSE 2-4 GM/100ML-% IV SOLN
2.0000 g | INTRAVENOUS | Status: AC
  Administered 2022-03-08: 2 g via INTRAVENOUS
  Filled 2022-03-08: qty 100

## 2022-03-08 MED ORDER — ONDANSETRON HCL 4 MG/2ML IJ SOLN
INTRAMUSCULAR | Status: DC | PRN
  Administered 2022-03-08: 4 mg via INTRAVENOUS

## 2022-03-08 MED ORDER — PHENYLEPHRINE HCL (PRESSORS) 10 MG/ML IV SOLN
INTRAVENOUS | Status: AC
  Filled 2022-03-08: qty 1

## 2022-03-08 MED ORDER — BUPIVACAINE HCL (PF) 0.25 % IJ SOLN
INTRAMUSCULAR | Status: DC | PRN
  Administered 2022-03-08: 30 mL via PERINEURAL

## 2022-03-08 MED ORDER — MORPHINE SULFATE (PF) 4 MG/ML IV SOLN: 4.0000 mg | INTRAVENOUS | Status: DC | PRN

## 2022-03-08 MED ORDER — DIPHENHYDRAMINE HCL 50 MG/ML IJ SOLN: 12.5000 mg | Freq: Four times a day (QID) | INTRAMUSCULAR | Status: DC | PRN

## 2022-03-08 MED ORDER — DEXAMETHASONE SODIUM PHOSPHATE 10 MG/ML IJ SOLN
INTRAMUSCULAR | Status: DC | PRN
  Administered 2022-03-08: 10 mg via INTRAVENOUS

## 2022-03-08 MED ORDER — METHOCARBAMOL 500 MG PO TABS: 500.0000 mg | ORAL_TABLET | Freq: Four times a day (QID) | ORAL | Status: DC | PRN

## 2022-03-08 MED ORDER — OXYCODONE HCL 5 MG PO TABS
5.0000 mg | ORAL_TABLET | ORAL | Status: DC | PRN
  Administered 2022-03-08 (×3): 10 mg via ORAL
  Administered 2022-03-09: 5 mg via ORAL
  Administered 2022-03-09: 10 mg via ORAL
  Filled 2022-03-08 (×4): qty 2

## 2022-03-08 MED ORDER — ORAL CARE MOUTH RINSE: 15.0000 mL | Freq: Once | OROMUCOSAL | Status: AC

## 2022-03-08 MED ORDER — LACTATED RINGERS IV SOLN: INTRAVENOUS | Status: DC

## 2022-03-08 MED ORDER — PROPOFOL 10 MG/ML IV BOLUS
INTRAVENOUS | Status: DC | PRN
  Administered 2022-03-08: 200 mg via INTRAVENOUS

## 2022-03-08 MED ORDER — LIDOCAINE 2% (20 MG/ML) 5 ML SYRINGE
INTRAMUSCULAR | Status: DC | PRN
  Administered 2022-03-08: 100 mg via INTRAVENOUS

## 2022-03-08 MED ORDER — DIPHENHYDRAMINE HCL 12.5 MG/5ML PO ELIX: 12.5000 mg | ORAL_SOLUTION | Freq: Four times a day (QID) | ORAL | Status: DC | PRN

## 2022-03-08 MED ORDER — PROPOFOL 10 MG/ML IV BOLUS
INTRAVENOUS | Status: AC
  Filled 2022-03-08: qty 20

## 2022-03-08 MED ORDER — PROMETHAZINE HCL 25 MG/ML IJ SOLN: 6.2500 mg | INTRAMUSCULAR | Status: DC | PRN

## 2022-03-08 MED ORDER — FENTANYL CITRATE (PF) 250 MCG/5ML IJ SOLN
INTRAMUSCULAR | Status: AC
  Filled 2022-03-08: qty 5

## 2022-03-08 MED ORDER — PHENYLEPHRINE 80 MCG/ML (10ML) SYRINGE FOR IV PUSH (FOR BLOOD PRESSURE SUPPORT)
PREFILLED_SYRINGE | INTRAVENOUS | Status: DC | PRN
  Administered 2022-03-08: 80 ug via INTRAVENOUS
  Administered 2022-03-08: 160 ug via INTRAVENOUS
  Administered 2022-03-08: 80 ug via INTRAVENOUS

## 2022-03-08 MED ORDER — OXYCODONE HCL 5 MG PO TABS
ORAL_TABLET | ORAL | Status: AC
  Filled 2022-03-08: qty 2

## 2022-03-08 MED ORDER — ONDANSETRON HCL 4 MG/2ML IJ SOLN: 4.0000 mg | Freq: Four times a day (QID) | INTRAMUSCULAR | Status: DC | PRN

## 2022-03-08 MED ORDER — MIDAZOLAM HCL 2 MG/2ML IJ SOLN
INTRAMUSCULAR | Status: AC
  Filled 2022-03-08: qty 2

## 2022-03-08 MED ORDER — FENTANYL CITRATE (PF) 100 MCG/2ML IJ SOLN
INTRAMUSCULAR | Status: AC
  Filled 2022-03-08: qty 2

## 2022-03-08 MED ORDER — LATANOPROST 0.005 % OP SOLN
1.0000 [drp] | Freq: Every day | OPHTHALMIC | Status: DC
  Administered 2022-03-08: 1 [drp] via OPHTHALMIC
  Filled 2022-03-08: qty 2.5

## 2022-03-08 MED ORDER — PHENYLEPHRINE HCL-NACL 20-0.9 MG/250ML-% IV SOLN
INTRAVENOUS | Status: DC | PRN
  Administered 2022-03-08: 30 ug/min via INTRAVENOUS

## 2022-03-08 MED ORDER — ACETAMINOPHEN 500 MG PO TABS
1000.0000 mg | ORAL_TABLET | ORAL | Status: AC
  Administered 2022-03-08: 1000 mg via ORAL
  Filled 2022-03-08: qty 2

## 2022-03-08 MED ORDER — ENOXAPARIN SODIUM 40 MG/0.4ML IJ SOSY
40.0000 mg | PREFILLED_SYRINGE | INTRAMUSCULAR | Status: DC
  Administered 2022-03-09: 40 mg via SUBCUTANEOUS
  Filled 2022-03-08: qty 0.4

## 2022-03-08 MED ORDER — MIDAZOLAM HCL 2 MG/2ML IJ SOLN
INTRAMUSCULAR | Status: AC
  Administered 2022-03-08: 2 mg
  Filled 2022-03-08: qty 2

## 2022-03-08 MED ORDER — 0.9 % SODIUM CHLORIDE (POUR BTL) OPTIME
TOPICAL | Status: DC | PRN
  Administered 2022-03-08: 1000 mL

## 2022-03-08 MED ORDER — CEFAZOLIN SODIUM-DEXTROSE 2-4 GM/100ML-% IV SOLN
2.0000 g | Freq: Three times a day (TID) | INTRAVENOUS | Status: AC
  Administered 2022-03-08: 2 g via INTRAVENOUS
  Filled 2022-03-08: qty 100

## 2022-03-08 MED ORDER — MIDAZOLAM HCL 2 MG/2ML IJ SOLN: 2.0000 mg | Freq: Once | INTRAMUSCULAR | Status: DC

## 2022-03-08 MED ORDER — OXYCODONE HCL 5 MG PO TABS: 5.0000 mg | ORAL_TABLET | Freq: Once | ORAL | Status: DC | PRN

## 2022-03-08 MED ORDER — ACETAMINOPHEN 650 MG RE SUPP: 650.0000 mg | Freq: Four times a day (QID) | RECTAL | Status: DC | PRN

## 2022-03-08 MED ORDER — CHLORHEXIDINE GLUCONATE CLOTH 2 % EX PADS: 6.0000 | MEDICATED_PAD | Freq: Once | CUTANEOUS | Status: DC

## 2022-03-08 MED ORDER — TRAMADOL HCL 50 MG PO TABS: 50.0000 mg | ORAL_TABLET | Freq: Four times a day (QID) | ORAL | Status: DC | PRN

## 2022-03-08 MED ORDER — DOCUSATE SODIUM 100 MG PO CAPS
100.0000 mg | ORAL_CAPSULE | Freq: Two times a day (BID) | ORAL | Status: DC
  Administered 2022-03-08 – 2022-03-09 (×3): 100 mg via ORAL
  Filled 2022-03-08 (×3): qty 1

## 2022-03-08 MED ORDER — HEMOSTATIC AGENTS (NO CHARGE) OPTIME
TOPICAL | Status: DC | PRN
  Administered 2022-03-08: 1

## 2022-03-08 MED ORDER — CHLORHEXIDINE GLUCONATE 0.12 % MT SOLN
15.0000 mL | Freq: Once | OROMUCOSAL | Status: AC
  Filled 2022-03-08: qty 15

## 2022-03-08 MED ORDER — OXYCODONE HCL 5 MG/5ML PO SOLN: 5.0000 mg | Freq: Once | ORAL | Status: DC | PRN

## 2022-03-08 MED ORDER — ONDANSETRON 4 MG PO TBDP: 4.0000 mg | ORAL_TABLET | Freq: Four times a day (QID) | ORAL | Status: DC | PRN

## 2022-03-08 MED ORDER — SODIUM CHLORIDE 0.9 % IV SOLN: INTRAVENOUS | Status: DC

## 2022-03-08 MED ORDER — CHLORHEXIDINE GLUCONATE 0.12 % MT SOLN
15.0000 mL | Freq: Once | OROMUCOSAL | Status: AC
  Administered 2022-03-08: 15 mL via OROMUCOSAL

## 2022-03-08 MED ORDER — ACETAMINOPHEN 325 MG PO TABS: 650.0000 mg | ORAL_TABLET | Freq: Four times a day (QID) | ORAL | Status: DC | PRN

## 2022-03-08 MED ORDER — FENTANYL CITRATE (PF) 250 MCG/5ML IJ SOLN
INTRAMUSCULAR | Status: DC | PRN
  Administered 2022-03-08 (×6): 25 ug via INTRAVENOUS

## 2022-03-08 MED ORDER — FENTANYL CITRATE (PF) 100 MCG/2ML IJ SOLN
25.0000 ug | INTRAMUSCULAR | Status: DC | PRN
  Administered 2022-03-08 (×2): 50 ug via INTRAVENOUS

## 2022-03-08 SURGICAL SUPPLY — 45 items
APPLIER CLIP 9.375 MED OPEN (MISCELLANEOUS) ×1
BAG COUNTER SPONGE SURGICOUNT (BAG) ×1 IMPLANT
BENZOIN TINCTURE PRP APPL 2/3 (GAUZE/BANDAGES/DRESSINGS) IMPLANT
BINDER BREAST LRG (GAUZE/BANDAGES/DRESSINGS) IMPLANT
BINDER BREAST XLRG (GAUZE/BANDAGES/DRESSINGS) IMPLANT
BINDER BREAST XXLRG (GAUZE/BANDAGES/DRESSINGS) IMPLANT
BIOPATCH RED 1 DISK 7.0 (GAUZE/BANDAGES/DRESSINGS) ×2 IMPLANT
BLADE CLIPPER SURG (BLADE) IMPLANT
CANISTER SUCT 3000ML PPV (MISCELLANEOUS) ×1 IMPLANT
CHLORAPREP W/TINT 26 (MISCELLANEOUS) ×1 IMPLANT
CLIP APPLIE 9.375 MED OPEN (MISCELLANEOUS) ×1 IMPLANT
COVER SURGICAL LIGHT HANDLE (MISCELLANEOUS) ×1 IMPLANT
DRAIN CHANNEL 19F RND (DRAIN) ×1 IMPLANT
DRAPE LAPAROSCOPIC ABDOMINAL (DRAPES) ×1 IMPLANT
DRSG TEGADERM 4X4.5 CHG (GAUZE/BANDAGES/DRESSINGS) ×2 IMPLANT
ELECT BLADE 6.5 EXT (BLADE) IMPLANT
ELECT CAUTERY BLADE 6.4 (BLADE) IMPLANT
ELECT REM PT RETURN 9FT ADLT (ELECTROSURGICAL) ×1
ELECTRODE REM PT RTRN 9FT ADLT (ELECTROSURGICAL) ×1 IMPLANT
EVACUATOR SILICONE 100CC (DRAIN) ×1 IMPLANT
GAUZE PAD ABD 8X10 STRL (GAUZE/BANDAGES/DRESSINGS) ×1 IMPLANT
GAUZE SPONGE 4X4 12PLY STRL (GAUZE/BANDAGES/DRESSINGS) ×1 IMPLANT
GAUZE SPONGE 4X4 12PLY STRL LF (GAUZE/BANDAGES/DRESSINGS) ×1 IMPLANT
GAUZE XEROFORM 1X8 LF (GAUZE/BANDAGES/DRESSINGS) ×2 IMPLANT
GLOVE BIO SURGEON STRL SZ7 (GLOVE) ×1 IMPLANT
GLOVE BIOGEL PI IND STRL 7.5 (GLOVE) ×1 IMPLANT
GLOVE SURG ENC MOIS LTX SZ7 (GLOVE) IMPLANT
GLOVE SURG SS PI 7.0 STRL IVOR (GLOVE) IMPLANT
GOWN STRL REUS W/ TWL LRG LVL3 (GOWN DISPOSABLE) ×2 IMPLANT
GOWN STRL REUS W/TWL LRG LVL3 (GOWN DISPOSABLE) ×2
HEMOSTAT ARISTA ABSORB 3G PWDR (HEMOSTASIS) IMPLANT
KIT BASIN OR (CUSTOM PROCEDURE TRAY) ×1 IMPLANT
KIT TURNOVER KIT B (KITS) ×1 IMPLANT
NS IRRIG 1000ML POUR BTL (IV SOLUTION) ×1 IMPLANT
PACK GENERAL/GYN (CUSTOM PROCEDURE TRAY) ×1 IMPLANT
PAD ARMBOARD 7.5X6 YLW CONV (MISCELLANEOUS) ×1 IMPLANT
PENCIL SMOKE EVACUATOR (MISCELLANEOUS) ×1 IMPLANT
SPECIMEN JAR X LARGE (MISCELLANEOUS) ×1 IMPLANT
STRIP CLOSURE SKIN 1/2X4 (GAUZE/BANDAGES/DRESSINGS) IMPLANT
SUT ETHILON 3 0 FSL (SUTURE) ×1 IMPLANT
SUT MNCRL AB 4-0 PS2 18 (SUTURE) IMPLANT
SUT SILK 2 0 SH (SUTURE) ×1 IMPLANT
SUT VIC AB 3-0 SH 18 (SUTURE) ×1 IMPLANT
TOWEL GREEN STERILE (TOWEL DISPOSABLE) ×1 IMPLANT
TOWEL GREEN STERILE FF (TOWEL DISPOSABLE) ×1 IMPLANT

## 2022-03-08 NOTE — Anesthesia Procedure Notes (Addendum)
Anesthesia Regional Block: Pectoralis block   Pre-Anesthetic Checklist: , timeout performed,  Correct Patient, Correct Site, Correct Laterality,  Correct Procedure, Correct Position, site marked,  Risks and benefits discussed,  Surgical consent,  Pre-op evaluation,  At surgeon's request and post-op pain management  Laterality: Right  Prep: chloraprep       Needles:  Injection technique: Single-shot  Needle Type: Echogenic Stimulator Needle     Needle Length: 9cm  Needle Gauge: 21     Additional Needles:   Procedures:,,,, ultrasound used (permanent image in chart),,    Narrative:  Start time: 03/08/2022 9:58 AM End time: 03/08/2022 10:01 AM Injection made incrementally with aspirations every 5 mL.  Performed by: Personally  Anesthesiologist: Nilda Simmer, MD  Additional Notes: Discussed risks and benefits of nerve block including, but not limited to, prolonged and/or permanent nerve injury involving sensory and/or motor function. Monitors were applied and a time-out was performed. The nerve and associated structures were visualized under ultrasound guidance. After negative aspiration, local anesthetic was slowly injected around the nerve. There was no evidence of high pressure during the procedure. There were no paresthesias. VSS remained stable and the patient tolerated the procedure well.

## 2022-03-08 NOTE — Plan of Care (Signed)
  Problem: Education: Goal: Knowledge of General Education information will improve Description Including pain rating scale, medication(s)/side effects and non-pharmacologic comfort measures Outcome: Progressing   Problem: Health Behavior/Discharge Planning: Goal: Ability to manage health-related needs will improve Outcome: Progressing   

## 2022-03-08 NOTE — Anesthesia Postprocedure Evaluation (Signed)
Anesthesia Post Note  Patient: Jaime [name]  Procedure(s) Performed: RIGHT  MASTECTOMY (Right)     Patient location during evaluation: PACU Anesthesia Type: General Level of consciousness: awake Pain management: pain level controlled Vital Signs Assessment: post-procedure vital signs reviewed and stable Respiratory status: spontaneous breathing, nonlabored ventilation and respiratory function stable Cardiovascular status: blood pressure returned to baseline and stable Postop Assessment: no apparent nausea or vomiting Anesthetic complications: no   No notable events documented.  Last Vitals:  Vitals:   03/08/22 1230 03/08/22 1245  BP: (!) 158/74 (!) 170/57  Pulse: 71 71  Resp: 18 (!) 21  Temp:    SpO2: 97% 97%    Last Pain:  Vitals:   03/08/22 1245  TempSrc:   PainSc: Asleep                 Nilda Simmer

## 2022-03-08 NOTE — Transfer of Care (Signed)
Immediate Anesthesia Transfer of Care Note  Patient: SUMMERS BUENDIA  Procedure(s) Performed: RIGHT  MASTECTOMY (Right)  Patient Location: PACU  Anesthesia Type:GA combined with regional for post-op pain  Level of Consciousness: awake and alert   Airway & Oxygen Therapy: Patient Spontanous Breathing and Patient connected to face mask oxygen  Post-op Assessment: Report given to RN and Post -op Vital signs reviewed and stable  Post vital signs: Reviewed and stable  Last Vitals:  Vitals Value Taken Time  BP 146/119 03/08/22 1211  Temp    Pulse 71 03/08/22 1213  Resp 19 03/08/22 1213  SpO2 98 % 03/08/22 1213  Vitals shown include unvalidated device data.  Last Pain:  Vitals:   03/08/22 0935  TempSrc:   PainSc: 0-No pain         Complications: No notable events documented.

## 2022-03-08 NOTE — Anesthesia Procedure Notes (Signed)
Procedure Name: LMA Insertion Date/Time: 03/08/2022 10:37 AM  Performed by: Erick Colace, CRNAPre-anesthesia Checklist: Patient identified, Emergency Drugs available, Suction available and Patient being monitored Patient Re-evaluated:Patient Re-evaluated prior to induction Oxygen Delivery Method: Circle system utilized Preoxygenation: Pre-oxygenation with 100% oxygen Induction Type: IV induction Ventilation: Mask ventilation without difficulty LMA: LMA inserted LMA Size: 4.0 Laser Tube: Cuffed inflated with minimal occlusive pressure - saline Placement Confirmation: positive ETCO2 and breath sounds checked- equal and bilateral Tube secured with: Tape Dental Injury: Teeth and Oropharynx as per pre-operative assessment

## 2022-03-08 NOTE — Op Note (Signed)
   Preop diagnosis: Recurrent right breast invasive ductal carcinoma Postop diagnosis: Same Procedure performed: Right mastectomy Surgeon:Melizza Kanode K Nikeya Maxim Anesthesia: General via LMA with pectoral block Indications:This is a 61 year old female with a very extensive past medical history who presents with a diagnosis of recurrent right breast cancer.  In 2004, the patient was diagnosed with right breast cancer.  She underwent a lumpectomy and axillary lymph node dissection.  She underwent radiation to her right breast and axilla and also had chemotherapy.  In 2019, the patient was diagnosed with multicentric left breast cancer.  She underwent neoadjuvant chemotherapy.  Subsequently she underwent a left mastectomy with sentinel lymph node biopsy.  She had 4 sentinel lymph nodes taken at that time and all were negative.  All of her previous cancer care was performed at [facility] in [place]. According to some of the pathology reports the cancer in 2019 was invasive ductal carcinoma Grade 3, ER/PR negative, HER2 positive.  I cannot find any pathology reports from her right breast surgery in 2004.    Recently she underwent routine screening right mammogram.  This showed an area of calcifications in the lower central left breast with some focal asymmetry.  She underwent stereotactic biopsy on 11/6.  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  ER is 20% weakly positive, PR negative, Ki-67 20%, HER2 negative. After thorough discussion with the patient, we have opted for a right mastectomy.  Axillary ultrasound was negative.  Description of procedure: The patient was brought to the operating room and placed in the supine position on the operative table.  After adequate level general anesthesia was obtained, the right chest was prepped with ChloraPrep and draped sterile fashion.  Timeout was taken to ensure the proper patient and proper procedure.  I made an  elliptical incision around the nipple areolar complex oriented transverse fashion.  Skin flaps were raised inferiorly to the inframammary crease, medially to the edge of the sternum, superiorly to the chest wall below the clavicle, and laterally to the anterior edge of the latissimus muscle.  We then dissected the breast tissue off the underlying pectoralis muscle from medial to lateral.  The specimen is oriented with a long suture lateral and a short stitch superior.  This was sent for pathologic examination.  We inspected the wound and irrigated thoroughly.  Arista powder was sprayed throughout the wound to aid in hemostasis.  A 19 French drain was placed through a stab incision laterally and secured with a 3-0 nylon suture.  This does seem to have some redundant skin so we excised an additional segment of the inferior skin flap.  The 2 ends of the wound were sutured down to the underlying chest wall with interrupted Vicryl sutures.  3-0 Vicryl was used to close the subcutaneous tissues, tacking this down to the underlying chest wall.  4-0 Monocryl was used to close the skin in a subcuticular fashion.  Benzoin and Steri-Strips were applied.  The drain was placed to bulb suction.  A dry dressing was applied.  A breast binder was placed.  The patient was extubated and brought to the recovery in stable condition.  All sponge, instrument, and needle counts are correct.  Imogene Burn. Georgette Dover, MD, [facility] Surgery  General Surgery   03/08/2022 11:52 AM

## 2022-03-08 NOTE — H&P (Signed)
Subjective    Chief Complaint: Breast Cancer   Cardiology - [facility]   History of Present Illness: Jaime [name] is a 61 y.o. female who is seen today as an office consultation at the request of Dr. Inda Castle for evaluation of Breast Cancer .   This is a 61 year old female with a very extensive past medical history who presents with a diagnosis of recurrent right breast cancer.  In 2004, the patient was diagnosed with right breast cancer.  She underwent a lumpectomy and axillary lymph node dissection.  She underwent radiation to her right breast and axilla and also had chemotherapy.  In 2019, the patient was diagnosed with multicentric left breast cancer.  She underwent neoadjuvant chemotherapy.  Subsequently she underwent a left mastectomy with sentinel lymph node biopsy.  She had 4 sentinel lymph nodes taken at that time and all were negative.  All of her previous cancer care was performed at [facility] in [place]. According to some of the pathology reports the cancer in 2019 was invasive ductal carcinoma Grade 3, ER/PR negative, HER2 positive.  I cannot find any pathology reports from her right breast surgery in 2004.   The patient denies any previous genetic testing.   Recently she underwent routine screening right mammogram.  This showed an area of calcifications in the lower central left breast with some focal asymmetry.  She underwent stereotactic biopsy on 11/6.  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  ER is 20% weakly positive, PR negative, Ki-67 20%, HER2 negative.   She was recently seen by cardiology for shortness of breath.  Echocardiogram and coronary CT scan have been ordered but not yet performed.  Ultrasound of the right axilla is recommended but has not yet been performed.   The patient still has her right-sided port in place.  This was inserted in 2019.  She does get this flushed regularly.   Review of  Systems: A complete review of systems was obtained from the patient.  I have reviewed this information and discussed as appropriate with the patient.  See HPI as well for other ROS.   Review of Systems  Constitutional: Negative.   HENT: Negative.    Eyes: Negative.   Respiratory:  Positive for shortness of breath.   Cardiovascular: Negative.   Gastrointestinal: Negative.   Genitourinary: Negative.   Musculoskeletal: Negative.   Skin: Negative.   Neurological: Negative.   Endo/Heme/Allergies: Negative.   Psychiatric/Behavioral: Negative.          Medical History:     Past Medical History:  Diagnosis Date   Glaucoma (increased eye pressure)     History of cancer     Sleep apnea     Substance abuse (CMS-HCC)           Patient Active Problem List  Diagnosis   Aortic atherosclerosis (CMS-HCC)   Atypical chest pain   Recurrent breast cancer, right (CMS-HCC)   DOE (dyspnea on exertion)   Emphysema lung (CMS-HCC)   History of alcohol abuse   Glaucoma   Personal history of chemotherapy   S/P left mastectomy           Past Surgical History:  Procedure Laterality Date   LAPAROSCOPIC TUBAL LIGATION   1991   MASTECTOMY PARTIAL / LUMPECTOMY Right 2004   MASTECTOMY Left 2019   BREAST EXCISIONAL BIOPSY Right             Allergies  Allergen Reactions  Atorvastatin Other (See Comments)      constipated   Latex Hives      Allergic reaction to condoms years ago.            Current Outpatient Medications on File Prior to Visit  Medication Sig Dispense Refill   aspirin 81 MG chewable tablet Take 1 tablet by mouth once daily       ergocalciferol, vitamin D2, 1,250 mcg (50,000 unit) capsule Take 50,000 Units by mouth every 7 (seven) days       latanoprost (XALATAN) 0.005 % ophthalmic solution Apply 1 drop to eye once daily       lidocaine-prilocaine (EMLA) cream Apply 1 Application  topically as needed       atorvastatin (LIPITOR) 10 MG tablet Take 10 mg by mouth once  daily (Patient not taking: Reported on 02/04/2022)       metoprolol tartrate (LOPRESSOR) 100 MG tablet Take one tablet 2 hours before cardiac CT for heart greater than 55 (Patient not taking: Reported on 02/04/2022)       nitroGLYcerin (NITROSTAT) 0.4 MG SL tablet Place 0.4 mg under the tongue every 5 (five) minutes as needed (Patient not taking: Reported on 02/04/2022)        No current facility-administered medications on file prior to visit.           Family History  Problem Relation Age of Onset   Esophageal cancer Mother     Kidney disease Father     Diabetes Sister     High blood pressure (Hypertension) Brother        Social History        Tobacco Use  Smoking Status Every Day   Types: Cigarettes  Smokeless Tobacco Never      Social History         Socioeconomic History   Marital status: Married  Tobacco Use   Smoking status: Every Day      Types: Cigarettes   Smokeless tobacco: Never  Substance and Sexual Activity   Alcohol use: Not Currently   Drug use: Not Currently      Objective:         Vitals:    02/04/22 1129  BP: (!) 142/90  Pulse: 96  Temp: 36.3 [name] (97.3 F)  SpO2: 97%  Weight: (!) 104.3 kg (230 lb)  Height: 162.6 cm (_0 )    Body mass index is 39.48 kg/m.   Physical Exam    Constitutional:  WDWN in NAD, conversant, no obvious deformities; lying in bed comfortably Eyes:  Pupils equal, round; sclera anicteric; moist conjunctiva; no lid lag HENT:  Oral mucosa moist; good dentition  Neck:  No masses palpated, trachea midline; no thyromegaly Lungs:  CTA bilaterally; normal respiratory effort Breasts: Left chest shows a well-healed mastectomy incision with a slight dogear in the medial end of the incision.  No palpable masses.  No axillary lymphadenopathy. Right breast shows some residual radiation skin changes in the left axilla and the upper outer breast.  She has 2 well-healed incisions.  The axillary scar is fairly contracted.  She has  some firmness just above her right upper outer quadrant lumpectomy scar.  This firmness is fairly superficial.  Her right chest port site is clean with no sign of infection.  The biopsy site is in the right lower central breast.  No palpable masses in this area.  No nipple discharge.  No palpable lymphadenopathy. CV:  Regular rate and rhythm; no murmurs; extremities well-perfused  with no edema Abd:  +bowel sounds, soft, non-tender, no palpable organomegaly; no palpable hernias Musc:  Unable to assess gait; no apparent clubbing or cyanosis in extremities Lymphatic:  No palpable cervical or axillary lymphadenopathy Skin:  Warm, dry; no sign of jaundice Psychiatric - alert and oriented x 4; calm mood and affect     Labs, Imaging and Diagnostic Testing:   Diagnosis Breast, right, needle core biopsy, lower - INVASIVE DUCTAL CARCINOMA (NOS)/INVASIVE MAMMARY CARCINOMA (NST) SEE NOTE - FOCAL DUCTAL CARCINOMA IN SITU, SOLID TYPE, NUCLEAR GRADE 3 OF 3. - TUBULE FORMATION: SCORE 3 OF 3 - NUCLEAR PLEOMORPHISM: SCORE 2 OF 3 - MITOTIC COUNT: SCORE 1 OF 3 - TOTAL SCORE: 6 OF 9 - OVERALL GRADE: II OF III - LYMPHOVASCULAR INVASION: NOT IDENTIFIED 2 of 4 FINAL for Jaime [name], Jaime [name] ([id number]) Diagnosis(continued) - CANCER LENGTH: 5 MM IN GREATEST LINEAR DIMENSION ON FRAGMENTED CORES. - CALCIFICATIONS: PRESENT - OTHER FINDINGS: N/A NOTE: DR. PICKLESIMER REVIEWED THE CASE AND CONCURS WITH THE INTERPRETATION. A BREAST PROGNOSTIC PROFILE (ER, PR, KI-67 AND HER2) IS PENDING AND WILL BE REPORTED IN AN ADDENDUM. THE [facility] [place] WAS NOTIFIED ON 02/01/2022. Tilford Pillar DO Pathologist, Electronic Signature (Case signed 02/01/2022)   PROGNOSTIC INDICATORS Results: IMMUNOHISTOCHEMICAL AND MORPHOMETRIC ANALYSIS PERFORMED MANUALLY The tumor cells are equivocal for Her2 (2+). Her2 by FISH will be performed and the results reported separately. Estrogen Receptor: 20%, POSITIVE, WEAK  STAINING INTENSITY Progesterone Receptor: 0%, NEGATIVE Proliferation Marker Ki67: 20% COMMENT: The negative hormone receptor study(ies) in this case has an internal positive control. REFERENCE RANGE ESTROGEN RECEPTOR NEGATIVE 0% POSITIVE =>1% REFERENCE RANGE PROGESTERONE RECEPTOR NEGATIVE 0% POSITIVE =>1% All controls stained appropriately Casimer Lanius MD Pathologist, Electronic Signature ( Signed 02/02/2022)   FLUORESCENCE IN-SITU HYBRIDIZATION Results: GROUP 2: HER2 **NEGATIVE** Equivocal form of amplification of the HER2 gene was detected in the IHC 2+ tissue sample received from this individual. HER2 FISH was performed by a technologist and cell imaging and analysis on the [employer]. First Analysis: RATIO OF HER2/CEN 17 SIGNALS 2.40 AVERAGE HER2 COPY NUMBER PER CELL 3.60 An additional 20 cells was analyzed by FISH in a separate region with invasive cancer. Additional Analysis: RATIO OF HER2/CEN 17 SIGNALS 2.00 AVERAGE HER2 COPY NUMBER PER CELL 3.55 The ratio of HER2/CEN 17 is within the normal range >=2.0 of HER2/CEN 17 and a copy number of HER2 signals per cell is < 4.0. Arch Pathol Lab Med 1:1,2018 COMMENT: Evidence is limited on the efficacy of HER2-targeted therapy in the small subset of cases with HER2/CEN 17 ratio >=2.0 and an average HER2 copy number <4.0/cell. In the first generation of adjuvant trastuzumab trials, patients in this subgroup who were randomized to the trastuzumab arm did not appear to derive an improvement in disease free or overall survival, but there were too few such cases to draw definitive conclusions. IHC expression for HER2 should be used to complement ISH and define HER2 status. If IHC result is not 3+ positive, it is recommended that the specimen be considered HER2 negative because of the low HER2 copy number by ISH and lack of protein overexpression. Casimer Lanius MD Pathologist, Electronic Signature         CLINICAL DATA:  Screening.  History of LEFT mastectomy.   EXAM: DIGITAL SCREENING UNILATERAL RIGHT MAMMOGRAM WITH CAD AND TOMOSYNTHESIS   TECHNIQUE: Right screening digital craniocaudal and mediolateral oblique mammograms were obtained. Right screening digital breast tomosynthesis was performed. The images were evaluated with computer-aided detection.  COMPARISON:  Previous exam(s).   ACR Breast Density Category b: There are scattered areas of fibroglandular density.   FINDINGS: In the right breast, a possible asymmetry warrants further evaluation. This possible asymmetry is seen within the lower RIGHT breast, cc slice 61.   In the left breast, no findings suspicious for malignancy.   IMPRESSION: Further evaluation is suggested for possible asymmetry in the right breast.   RECOMMENDATION: Diagnostic mammogram and possibly ultrasound of the right breast. (Code:[id number]-51M)   The patient will be contacted regarding the findings, and additional imaging will be scheduled.   BI-RADS CATEGORY  0: Incomplete. Need additional imaging evaluation and/or prior mammograms for comparison.     Electronically Signed   By: Franki Cabot M.D.   On: 01/05/2022 14:22   Assessment and Plan:  Diagnoses and all orders for this visit:   Recurrent breast cancer, right (CMS-HCC)     The patient is status post right lumpectomy and axillary lymph node dissection followed by chemotherapy and radiation in 2004.  She is also status post mastectomy and neoadjuvant chemotherapy for left breast invasive ductal carcinoma in 2019.  She now has a another invasive ductal carcinoma which is most likely a triple negative in the right breast.   I had a long discussion with the patient about her options.  At this point with a recurrence after previous breast conserving therapy, we would recommend a right mastectomy.  She has had a full right axillary dissection so there would be no indications for sentinel lymph node biopsy.  The  patient is reluctant to consent to a another mastectomy without further information.  We will expedite a right axillary ultrasound to evaluate the palpable mass near her old incision as well as looking for any possible enlarged lymph nodes in the remaining axillary contents.  We will also expedite referrals to genetics, oncology, radiation oncology.  It is unlikely that she would be a candidate for further radiation but I would like her to have all this information before she makes the decision to proceed with a right mastectomy.   Plan right mastectomy.  The surgical procedure has been discussed with the patient.  Potential risks, benefits, alternative treatments, and expected outcomes have been explained.  All of the patient's questions at this time have been answered.  The likelihood of reaching the patient's treatment goal is good.  The patient understand the proposed surgical procedure and wishes to proceed.  Imogene Burn. Georgette Dover, MD, [facility] Surgery  General Surgery   03/08/2022 9:57 AM

## 2022-03-09 ENCOUNTER — Encounter (HOSPITAL_COMMUNITY): Payer: Self-pay | Admitting: Surgery

## 2022-03-09 DIAGNOSIS — D0511 Intraductal carcinoma in situ of right breast: Secondary | ICD-10-CM | POA: Diagnosis not present

## 2022-03-09 LAB — CBC
HCT: 34.8 % — ABNORMAL LOW (ref 36.0–46.0)
Hemoglobin: 11.9 g/dL — ABNORMAL LOW (ref 12.0–15.0)
MCH: 28.8 pg (ref 26.0–34.0)
MCHC: 34.2 g/dL (ref 30.0–36.0)
MCV: 84.3 fL (ref 80.0–100.0)
Platelets: 279 10*3/uL (ref 150–400)
RBC: 4.13 MIL/uL (ref 3.87–5.11)
RDW: 12.8 % (ref 11.5–15.5)
WBC: 7.6 10*3/uL (ref 4.0–10.5)
nRBC: 0 % (ref 0.0–0.2)

## 2022-03-09 LAB — BASIC METABOLIC PANEL
Anion gap: 7 (ref 5–15)
BUN: 10 mg/dL (ref 6–20)
CO2: 23 mmol/L (ref 22–32)
Calcium: 9.1 mg/dL (ref 8.9–10.3)
Chloride: 105 mmol/L (ref 98–111)
Creatinine, Ser: 0.64 mg/dL (ref 0.44–1.00)
GFR, Estimated: >60 mL/min (ref >60)
Glucose, Bld: 134 mg/dL — ABNORMAL HIGH (ref 70–99)
Potassium: 4.3 mmol/L (ref 3.5–5.1)
Sodium: 135 mmol/L (ref 135–145)

## 2022-03-09 MED ORDER — OXYCODONE HCL 5 MG PO TABS: 5.0000 mg | ORAL_TABLET | Freq: Four times a day (QID) | ORAL | 0 refills | Status: DC | PRN

## 2022-03-09 NOTE — Discharge Summary (Signed)
Physician Discharge Summary  Patient ID: Jaime [name] MRN: [id number] DOB/AGE: Nov 18, 1960 61 y.o.  Admit date: 03/08/2022 Discharge date: 03/09/2022  Admission Diagnoses:  Recurrent invasive ductal carcinoma right breast  Discharge Diagnoses: Same Principal Problem:   Breast CA [name]) Active Problems:   Recurrent infiltrating ductal carcinoma of right breast [name])   Discharged Condition: good  Hospital Course: 03/09/22 - right mastectomy.  Patient did well overnight.  Pain controlled with oxycodone.  No sign of hematoma.  Moderate drain output.   Significant Diagnostic Studies: labs:  Lab Results  Component Value Date   WBC 7.6 03/09/2022   HGB 11.9 (L) 03/09/2022   HCT 34.8 (L) 03/09/2022   MCV 84.3 03/09/2022   PLT 279 03/09/2022     Treatments: surgery: right mastectomy  Discharge Exam: Blood pressure (!) 169/66, pulse 70, temperature 98.1 F (36.7 C), temperature source Oral, resp. rate 18, height '5\' 4"'$  (1.626 m), weight 104.3 kg, SpO2 100 %. General appearance: alert, cooperative, and no distress Chest wall: right sided chest wall tenderness, slight bruising in the upper outer portion near the axilla The incision line is intact with minimal drainage  Disposition: Discharge disposition: 01-Home or Self Care       Discharge Instructions     Call MD for:  persistant nausea and vomiting   Complete by: As directed    Call MD for:  redness, tenderness, or signs of infection (pain, swelling, redness, odor or green/yellow discharge around incision site)   Complete by: As directed    Call MD for:  severe uncontrolled pain   Complete by: As directed    Call MD for:  temperature >100.4   Complete by: As directed    Diet general   Complete by: As directed    Discharge wound care:   Complete by: As directed    Sponge baths only while the drain is in place.  Empty the drain 1-2 times per day and record the output.  Please bring these numbers with you to  your office visit next week.  Do not drive.   Driving Restrictions   Complete by: As directed    Do not drive while taking pain medications   Increase activity slowly   Complete by: As directed       Allergies as of 03/09/2022       Reactions   Atorvastatin    constipated   Latex Hives   Allergic reaction to condoms years ago.        Medication List     TAKE these medications    aspirin EC 81 MG tablet Take 81 mg by mouth daily. Swallow whole.   ergocalciferol 1.25 MG (50000 UT) capsule Commonly known as: VITAMIN D2 Take 1 capsule (50,000 Units total) by mouth once a week.   ibuprofen 200 MG tablet Commonly known as: ADVIL Take 400 mg by mouth every 6 (six) hours as needed for moderate pain.   latanoprost 0.005 % ophthalmic solution Commonly known as: XALATAN Place 1 drop into both eyes daily.   lidocaine-prilocaine cream Commonly known as: EMLA Apply 1 Application topically as needed.   nitroGLYCERIN 0.4 MG SL tablet Commonly known as: NITROSTAT Place 1 tablet (0.4 mg total) under the tongue every 5 (five) minutes as needed for chest pain.   oxyCODONE 5 MG immediate release tablet Commonly known as: Oxy IR/ROXICODONE Take 1 tablet (5 mg total) by mouth every 6 (six) hours as needed for moderate pain.  Discharge Care Instructions  (From admission, onward)           Start     Ordered   03/09/22 0000  Discharge wound care:       Comments: Sponge baths only while the drain is in place.  Empty the drain 1-2 times per day and record the output.  Please bring these numbers with you to your office visit next week.  Do not drive.   03/09/22 0757            Follow-up Information     Donnie Mesa, MD Follow up in 1 week(s).   Specialty: General Surgery Why: For drain removal Contact information: [address] [place] [address] [phone]         Donnie Mesa, MD Follow up in 3 week(s).   Specialty:  General Surgery Why: For wound re-check Contact information: [address] [place] [address] [phone]                 Signed: Maia Petties 03/09/2022, 7:59 AM

## 2022-03-09 NOTE — Progress Notes (Signed)
Pt discharged home in stable condition 

## 2022-03-09 NOTE — Discharge Instructions (Signed)
[facility] [phone]  MASTECTOMY: POST OP INSTRUCTIONS  Always review your discharge instruction sheet given to you by the facility where your surgery was performed. IF YOU HAVE DISABILITY OR FAMILY LEAVE FORMS, YOU MUST BRING THEM TO THE OFFICE FOR PROCESSING.   DO NOT GIVE THEM TO YOUR DOCTOR. A prescription for pain medication may be given to you upon discharge.  Take your pain medication as prescribed, if needed.  If narcotic pain medicine is not needed, then you may take acetaminophen (Tylenol) or ibuprofen (Advil) as needed. Take your usually prescribed medications unless otherwise directed. If you need a refill on your pain medication, please contact your pharmacy.  They will contact our office to request authorization.  Prescriptions will not be filled after 5pm or on week-ends. You should follow a light diet the first few days after arrival home, such as soup and crackers, etc.  Resume your normal diet the day after surgery. Most patients will experience some swelling and bruising on the chest and underarm.  Ice packs will help.  Swelling and bruising can take several days to resolve.  It is common to experience some constipation if taking pain medication after surgery.  Increasing fluid intake and taking a stool softener (such as Colace) will usually help or prevent this problem from occurring.  A mild laxative (Milk of Magnesia or Miralax) should be taken according to package instructions if there are no bowel movements after 48 hours. Unless discharge instructions indicate otherwise, leave your bandage dry and in place until your next appointment in 3-5 days.  You may take a limited sponge bath.  No tube baths or showers until the drains are removed.  You may have steri-strips (small skin tapes) in place directly over the incision.  These strips should be left on the skin for 7-10 days.  If your surgeon used skin glue on the incision, you may shower in 24 hours.   The glue will flake off over the next 2-3 weeks.  Any sutures or staples will be removed at the office during your follow-up visit. DRAINS:  If you have drains in place, it is important to keep a list of the amount of drainage produced each day in your drains.  Before leaving the hospital, you should be instructed on drain care.  Call our office if you have any questions about your drains. ACTIVITIES:  You may resume regular (light) daily activities beginning the next day--such as daily self-care, walking, climbing stairs--gradually increasing activities as tolerated.  You may have sexual intercourse when it is comfortable.  Refrain from any heavy lifting or straining until approved by your doctor. You may drive when you are no longer taking prescription pain medication, you can comfortably wear a seatbelt, and you can safely maneuver your car and apply brakes. RETURN TO WORK:  __________________________________________________________ You should see your doctor in the office for a follow-up appointment approximately 3-5 days after your surgery.  Your doctor's nurse will typically make your follow-up appointment when she calls you with your pathology report.  Expect your pathology report 2-3 business days after your surgery.  You may call to check if you do not hear from us after three days.   OTHER INSTRUCTIONS: ______________________________________________________________________________________________ ____________________________________________________________________________________________ WHEN TO CALL YOUR DOCTOR: Fever over 101.0 Nausea and/or vomiting Extreme swelling or bruising Continued bleeding from incision. Increased pain, redness, or drainage from the incision. The clinic staff is available to answer your questions during regular business hours.  Please don't hesitate   to call and ask to speak to one of the nurses for clinical concerns.  If you have a medical emergency, go to the  nearest emergency room or call 911.  A surgeon from [facility] is always on call at the hospital. [address], [address], [place], [place]  [address] ? [address] [address], [place], [place]   [address] [phone] ? [phone] ? FAX [phone] Web site: [email or web address]  

## 2022-03-10 ENCOUNTER — Telehealth: Payer: Self-pay | Admitting: Cardiology

## 2022-03-10 NOTE — Telephone Encounter (Signed)
Pt calling back for echo results  

## 2022-03-10 NOTE — Telephone Encounter (Signed)
Patient informed of results.  

## 2022-03-11 LAB — SURGICAL PATHOLOGY

## 2022-03-14 ENCOUNTER — Encounter: Payer: Self-pay | Admitting: *Deleted

## 2022-03-14 NOTE — Progress Notes (Signed)
Patient was seen in this office several months ago to establish care. She had moved here from up [place] and had a history of breast cancer x 2 in both 2004 and 2019. At that time she had no evidence of recurrence. Since that appointment she's had a mammogram, biopsy and mastectomy.   Reviewed surgical pathology with Dr Marin Olp. No additional pathology needs. He would like to see patient in a new patient slot in January for discussion about further treatment.   Patient already scheduled for 04/05/2022 however this was only a 38mslot. Called patient and rescheduled her to later that day, to accommodate 60 minute appointment. She is aware of change in time.   Oncology Nurse Navigator Documentation     03/14/2022    8:30 AM  Oncology Nurse Navigator Flowsheets  Abnormal Finding Date 01/05/2022  Confirmed Diagnosis Date 01/31/2022  Phase of Treatment Surgery  Surgery Actual Start Date: 03/08/2022  Navigator Follow Up Date: 04/05/2022  Navigator Follow Up Reason: Follow-up After Biopsy  Navigator Location [facility]  Navigator Encounter Type Introductory Phone Call;Appt/Treatment Plan Review;Pathology Review  Treatment Initiated Date 03/08/2022  Patient Visit Type MedOnc  Treatment Phase Active Tx  Barriers/Navigation Needs Coordination of Care;Education  Education Other  Interventions Coordination of Care;Education  Acuity Level 2-Minimal Needs (1-2 Barriers Identified)  Coordination of Care Appts;Pathology  Education Method Verbal;Teach-back  Time Spent with Patient 30

## 2022-03-24 ENCOUNTER — Other Ambulatory Visit: Payer: Self-pay | Admitting: *Deleted

## 2022-04-01 ENCOUNTER — Encounter: Payer: Self-pay | Admitting: *Deleted

## 2022-04-01 ENCOUNTER — Ambulatory Visit (INDEPENDENT_AMBULATORY_CARE_PROVIDER_SITE_OTHER): Payer: Medicare Other | Admitting: Family

## 2022-04-01 ENCOUNTER — Telehealth: Payer: Self-pay | Admitting: Family

## 2022-04-01 VITALS — BP 127/57 | HR 87 | Temp 98.7°F | Resp 16 | Wt 226.0 lb

## 2022-04-01 DIAGNOSIS — R918 Other nonspecific abnormal finding of lung field: Secondary | ICD-10-CM | POA: Diagnosis not present

## 2022-04-01 DIAGNOSIS — T8149XA Infection following a procedure, other surgical site, initial encounter: Secondary | ICD-10-CM

## 2022-04-01 DIAGNOSIS — Z1211 Encounter for screening for malignant neoplasm of colon: Secondary | ICD-10-CM | POA: Diagnosis not present

## 2022-04-01 DIAGNOSIS — Z720 Tobacco use: Secondary | ICD-10-CM

## 2022-04-01 DIAGNOSIS — C801 Malignant (primary) neoplasm, unspecified: Secondary | ICD-10-CM | POA: Diagnosis not present

## 2022-04-01 DIAGNOSIS — H6121 Impacted cerumen, right ear: Secondary | ICD-10-CM | POA: Insufficient documentation

## 2022-04-01 DIAGNOSIS — Z87891 Personal history of nicotine dependence: Secondary | ICD-10-CM

## 2022-04-01 HISTORY — DX: Infection following a procedure, other surgical site, initial encounter: T81.49XA

## 2022-04-01 MED ORDER — CEPHALEXIN 500 MG PO CAPS: 500.0000 mg | ORAL_CAPSULE | Freq: Three times a day (TID) | ORAL | 0 refills | Status: DC

## 2022-04-01 NOTE — Assessment & Plan Note (Signed)
S/p right mastectomy.  Pt has an upcoming appointment on 04/05/21 with oncology to discuss further treatment recommendations.

## 2022-04-01 NOTE — Progress Notes (Signed)
Oncotype Recurrence Score of 52 received. Report shared with Dr Marin Olp and report placed in scanned bin.   Oncology Nurse Navigator Documentation     04/01/2022    3:15 PM  Oncology Nurse Navigator Flowsheets  Navigator Follow Up Date: 04/05/2022  Navigator Follow Up Reason: Follow-up Appointment  Navigator Location [facility]  Navigator Encounter Type Pathology Review  Patient Visit Type MedOnc  Treatment Phase Active Tx  Barriers/Navigation Needs Coordination of Care;Education  Interventions Coordination of Care  Acuity Level 2-Minimal Needs (1-2 Barriers Identified)  Coordination of Care Other  Time Spent with Patient 15

## 2022-04-01 NOTE — Addendum Note (Signed)
Addended by: Jiles Prows on: 04/01/2022 10:38 AM   Modules accepted: Orders

## 2022-04-01 NOTE — Assessment & Plan Note (Signed)
We again discussed importance of complete cessation of smoking.

## 2022-04-01 NOTE — Assessment & Plan Note (Signed)
Mild.  Wound culture was obtained. She will be started on Keflex. Has follow up scheduled with her surgeon on 04/04/22.  Wound was gently cleansed with normal saline and dressed with a wet to dry dressing.

## 2022-04-01 NOTE — Progress Notes (Signed)
Subjective:   By signing my name below, I, Eugene Gavia, attest that this documentation has been prepared under the direction and in the presence of Nance Pear, NP 04/01/22   Patient ID: Jaime [name], female    DOB: 1960/08/16, 62 y.o.   MRN: [id number]  Chief Complaint  Patient presents with   Follow-up    Here for follow up   Post-op Problem    Had right mastectomy 03/08/2022. "Stitch came out    HPI Patient is in today for 3 month follow up.  Recurrent right breast cancer: Initially diagnosed in 2004, pN1cN1cM0 triple negative right breast cancer (poorly differentiated carcinoma). She underwent chemo/radiation/lumptectomy treatment at that time. She had routine screening right mammogram which showed an area of calcifications in the lower central right breast with some focal asymmetry. She underwent stereotactic biopsy on 01/31/22. 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 ER is 20% weakly positive, PR negative, Ki-67 20%, HER2 negative.  Patient has since had a right mastectomy with Dr. Donnie Mesa on 03/08/22. Today, she states that she had a suture fall out from the mastectomy site and has noticed a mild odor from the site. She denies any fever, warmth of the area, or oozing discharge from the site. She is scheduled for postop follow up with Dr. Georgette Dover on 04/04/2021 and oncology follow up with Dr. Marin Olp on 04/05/22.   Left breast cancer: Diagnosed in 2019- 2 diferent types of CA left breast. Grade 3 invasive carcinoma, ER/PR negative. The larger mass was HER-2 positive.  Smaller mass was HER-2 negative. Patient completed 6 cycles of neoadjuvant chemotherapy. Patient underwent left simple mastectomy and a sentinel lymph node biopsy on 09/15/2017. Clinical Stage IIA cT2N0M0 ER-/PR-/HER2+ Clinical Stage 1A cT1N0M0 ER-/PR-/HER2-  Both were ypT1aN0(sn), grade 3.   Open angle glaucoma: Followed by ophthalmology Dr. Benay Pillow.    History of tobacco abuse: She is currently smoking 2-3 cigarettes a day and is working on cessation. Her annual CT lung cancer screening was done on 01/05/2022 and negative.  Dyspnea on exertion: She had a CT coronary calcium score on 02/21/22 with a total calcium score of 0. She had an echocardiogram on 03/04/22 which revealed LVEF of 55-60%, Grade I LVDD, and no valvular abnormalities. Today, patient reports that she continues to have DOE.  Aortic atherosclerosis: Followed by cardiology Dr. Jyl Heinz. Started on nitro 0.'4mg'$  prn. She is taking aspirin daily. She states that she has not needed to take any nitro. She believes that she is scheduled for follow up with Dr. Geraldo Pitter next month.  Hyperlipidemia: At patient request, Dr. Geraldo Pitter discontinued her Atorvastatin '10mg'$  on 02/03/22. Lab Results  Component Value Date   CHOL 198 06/09/2021   HDL 74.10 06/09/2021   LDLCALC 112 (H) 06/09/2021   TRIG 64.0 06/09/2021   CHOLHDL 3 06/09/2021    Obesity: She reports that she has been working on improving her diet and eating less sweets with less emotional eating. She has started walking 3 days a week and plans to increase this regimen. Wt Readings from Last 5 Encounters:  04/01/22 226 lb (102.5 kg)  03/08/22 230 lb (104.3 kg)  03/03/22 230 lb 14.4 oz (104.7 kg)  02/10/22 231 lb 2 oz (104.8 kg)  02/03/22 229 lb 1.3 oz (103.9 kg)    Thyromegaly: US thyroid done on 01/05/22 showed mild thyromegaly with a single subcentimeter right nodule. No biopsy or follow-up was recommended.  Colonoscopy: She reports  having a colonoscopy 10 years ago and states that it was normal. She is agreeable to Cologuard testing.  Vaccine counseling: She has not had the COVID booster vaccine this season. She has not had the Shingrix vaccine. She politely declined getting these done at this time.  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   Hx of breast cancer 02/13/2019   Right (2004) pT1cN1 cM0 triple negative s/p lumpectomy and ALND with adjuvant ddAc-T and radiation Left (2018) - Stage IIA cT2N0M0 ER-/PR-/Her2+ and Stage IA CT1CN0 ER-/PR-/Her2- both ypT1aN0(sn) grade 3 - s/p 6C neoadjuvant chemo, and mastectomy and SLNB on 09/15/2017 (records scanned)   Lung mass 02/13/2019   Open-angle glaucoma 02/13/2019   Personal history of chemotherapy    Personal history of radiation therapy    Personal history of tobacco use, presenting hazards to health 03/12/2019   Right thyroid nodule    S/P left mastectomy 02/13/2019   Sleep apnea    mild per pt, no CPAP recommended   Tobacco use 02/13/2019    Past Surgical History:  Procedure Laterality Date   BREAST BIOPSY Left 2019   BREAST BIOPSY Right 01/31/2022   MM RT BREAST BX W LOC DEV 1ST LESION IMAGE BX SPEC STEREO GUIDE 01/31/2022 [facility]   BREAST LUMPECTOMY Right 2004   triple Neg breast CA   DILATION AND CURETTAGE OF UTERUS  1985   MASTECTOMY Left 08/2017   TOTAL MASTECTOMY Right 03/08/2022   Procedure: RIGHT  MASTECTOMY;  Surgeon: Donnie Mesa, MD;  Location: [facility] OR;  Service: General;  Laterality: Right;  PEC BLOCK   TUBAL LIGATION  1991   UNILATERAL SALPINGECTOMY  1985    Family History  Problem Relation Age of Onset   Alcohol abuse Mother    Lung cancer Mother    Esophageal cancer Mother    Throat cancer Mother    Alcohol abuse Father    Arthritis Father    Kidney disease Father    Dementia Father    Arthritis Sister    Diabetes Sister    Arthritis Brother    Hypertension Brother    Sickle cell  anemia Brother    HIV Brother    Heart attack Brother 90       massive MI   Thyroid disease Brother     Social History   Socioeconomic History   Marital status: Married    Spouse name: Not on file   Number of children: 2   Years of education: associates   Highest education level: Not on file  Occupational History   Not on file  Tobacco Use   Smoking status: Every Day    Packs/day: 0.25    Years: 42.00    Total pack years: 10.50    Types: Cigarettes   Smokeless tobacco: Never  Vaping Use   Vaping Use: Some days  Substance and Sexual Activity   Alcohol use: Not Currently    Comment: quit in 1999  Drug use: Not Currently    Comment: quit in 1996-street drugs (coccaine, marijuana)   Sexual activity: Not Currently  Other Topics Concern   Not on file  Social History Narrative   02/13/19   From: Born in [place] but grew up in [place]: with husband   Work: currently on disability      Family: Daughter - Sales executive (biological daughter) Son - Vicente Males (biological son) has one step son       Enjoys: walking, picnics, bowling, reading, playing cards      Exercise: walking - tries to do everyday - uses fitbit and tries to get 5000 steps   Diet: veggies, water, 2-3 cups of coffee, trying to monitor what she eats      Safety   Seat belts: Yes    Guns: No   Safe in relationships: Yes    Social Determinants of Health   Financial Resource Strain: Medium Risk (02/13/2019)   Overall Financial Resource Strain (CARDIA)    Difficulty of Paying Living Expenses: Somewhat hard  Food Insecurity: No Food Insecurity (03/08/2022)   Hunger Vital Sign    Worried About Running Out of Food in the Last Year: Never true    [place] in the Last Year: Never true  Transportation Needs: No Transportation Needs (03/08/2022)   PRAPARE - Hydrologist (Medical): No    Lack of Transportation (Non-Medical): No  Physical Activity: Not on file  Stress: Not  on file  Social Connections: Not on file  Intimate Partner Violence: Not At Risk (03/08/2022)   Humiliation, Afraid, Rape, and Kick questionnaire    Fear of Current or Ex-Partner: No    Emotionally Abused: No    Physically Abused: No    Sexually Abused: No    Outpatient Medications Prior to Visit  Medication Sig Dispense Refill   aspirin EC 81 MG tablet Take 81 mg by mouth daily. Swallow whole.     ergocalciferol (VITAMIN D2) 1.25 MG (50000 UT) capsule Take 1 capsule (50,000 Units total) by mouth once a week. 8 capsule 3   ibuprofen (ADVIL) 200 MG tablet Take 400 mg by mouth every 6 (six) hours as needed for moderate pain.     latanoprost (XALATAN) 0.005 % ophthalmic solution Place 1 drop into both eyes daily.     lidocaine-prilocaine (EMLA) cream Apply 1 Application topically as needed. 30 g 3   nitroGLYCERIN (NITROSTAT) 0.4 MG SL tablet Place 1 tablet (0.4 mg total) under the tongue every 5 (five) minutes as needed for chest pain. 25 tablet 6   oxyCODONE (OXY IR/ROXICODONE) 5 MG immediate release tablet Take 1 tablet (5 mg total) by mouth every 6 (six) hours as needed for moderate pain. 30 tablet 0   No facility-administered medications prior to visit.    Allergies  Allergen Reactions   Atorvastatin     constipated   Latex Hives    Allergic reaction to condoms years ago.    Review of Systems  Constitutional:  Negative for fever and malaise/fatigue.  HENT:  Negative for congestion.   Eyes:  Negative for blurred vision.  Respiratory:  Negative for shortness of breath.   Cardiovascular:  Negative for chest pain, palpitations and leg swelling.  Gastrointestinal:  Negative for abdominal pain, blood in stool and nausea.  Genitourinary:  Negative for dysuria and frequency.  Musculoskeletal:  Negative for falls.  Skin:  Negative for rash.       +  right breat surgical incision opening, malodorous discharge  Neurological:  Negative for dizziness, loss of consciousness and headaches.   Endo/Heme/Allergies:  Negative for environmental allergies.  Psychiatric/Behavioral:  Negative for depression. The patient is not nervous/anxious.        Objective:    Physical Exam Vitals and nursing note reviewed. Exam conducted with a chaperone present.  Constitutional:      General: She is not in acute distress.    Appearance: She is well-developed.  HENT:     Head: Normocephalic and atraumatic.     Nose: Nose normal.  Eyes:     General:        Right eye: No discharge.        Left eye: No discharge.  Cardiovascular:     Rate and Rhythm: Normal rate and regular rhythm.     Heart sounds: No murmur heard. Pulmonary:     Effort: Pulmonary effort is normal.     Breath sounds: Normal breath sounds.  Chest:  Breasts:    Right: Absent.     Left: Absent.     Comments: Image of right breast surgical incision attached below Abdominal:     General: Bowel sounds are normal.     Palpations: Abdomen is soft.     Tenderness: There is no abdominal tenderness.  Musculoskeletal:     Cervical back: Normal range of motion and neck supple.  Skin:    General: Skin is warm and dry.     Comments: Open area of mastectomy wound as noted in photo  Neurological:     Mental Status: She is alert and oriented to person, place, and time.      BP (!) 127/57   Pulse 87   Temp 98.7 F (37.1 C) (Oral)   Resp 16   Wt 226 lb (102.5 kg)   BMI 38.79 kg/m  Wt Readings from Last 3 Encounters:  04/01/22 226 lb (102.5 kg)  03/08/22 230 lb (104.3 kg)  03/03/22 230 lb 14.4 oz (104.7 kg)       Assessment & Plan:  Screening for colon cancer -     Cologuard  Surgical wound infection Assessment & Plan: Mild.  Wound culture was obtained. She will be started on Keflex. Has follow up scheduled with her surgeon on 04/04/22.  Wound was gently cleansed with normal saline and dressed with a wet to dry dressing.    Ductal carcinoma [facility]) Assessment & Plan: S/p right mastectomy.  Pt has an upcoming  appointment on 04/05/21 with oncology to discuss further treatment recommendations.    Lung mass Assessment & Plan: Screening lung CT negative in October.    Tobacco use Assessment & Plan: We again discussed importance of complete cessation of smoking.    Other orders -     Cephalexin; Take 1 capsule (500 mg total) by mouth 3 (three) times daily.  Dispense: 21 capsule; Refill: 0     I,Alexis Herring,acting as a scribe for Nance Pear, NP.,have documented all relevant documentation on the behalf of Nance Pear, NP,as directed by  Nance Pear, NP while in the presence of Nance Pear, NP.   I, Nance Pear, NP, personally preformed the services described in this documentation.  All medical record entries made by the scribe were at my direction and in my presence.  I have reviewed the chart and discharge instructions (if applicable) and agree that the record reflects my personal performance and is accurate and complete. 04/01/22  Nance Pear, NP

## 2022-04-01 NOTE — Telephone Encounter (Signed)
See mychart.  

## 2022-04-01 NOTE — Assessment & Plan Note (Deleted)
New.  After verbal consent, right ear was irrigated by CMA. Pt tolerated procedure well and cerumen was successfully removed.

## 2022-04-01 NOTE — Assessment & Plan Note (Signed)
Screening lung CT negative in October.

## 2022-04-01 NOTE — Addendum Note (Signed)
Addended by: Debbrah Alar on: 04/01/2022 04:16 PM   Modules accepted: Orders

## 2022-04-04 ENCOUNTER — Other Ambulatory Visit: Payer: Medicare Other

## 2022-04-04 ENCOUNTER — Ambulatory Visit: Payer: Medicare Other | Admitting: Hematology & Oncology

## 2022-04-04 LAB — WOUND CULTURE
MICRO NUMBER:: 14394061
SPECIMEN QUALITY:: ADEQUATE

## 2022-04-05 ENCOUNTER — Encounter: Payer: Self-pay | Admitting: *Deleted

## 2022-04-05 ENCOUNTER — Other Ambulatory Visit: Payer: Medicare Other

## 2022-04-05 ENCOUNTER — Inpatient Hospital Stay: Payer: Medicare Other | Attending: Family

## 2022-04-05 ENCOUNTER — Inpatient Hospital Stay (HOSPITAL_BASED_OUTPATIENT_CLINIC_OR_DEPARTMENT_OTHER): Payer: Medicare Other | Admitting: Hematology & Oncology

## 2022-04-05 ENCOUNTER — Ambulatory Visit: Payer: Medicare Other | Admitting: Hematology & Oncology

## 2022-04-05 ENCOUNTER — Encounter (HOSPITAL_COMMUNITY): Payer: Self-pay

## 2022-04-05 ENCOUNTER — Inpatient Hospital Stay: Payer: Medicare Other

## 2022-04-05 ENCOUNTER — Other Ambulatory Visit: Payer: Self-pay

## 2022-04-05 ENCOUNTER — Encounter: Payer: Self-pay | Admitting: Hematology & Oncology

## 2022-04-05 VITALS — BP 145/84 | HR 71 | Temp 98.2°F | Resp 18 | Wt 229.0 lb

## 2022-04-05 DIAGNOSIS — E559 Vitamin D deficiency, unspecified: Secondary | ICD-10-CM

## 2022-04-05 DIAGNOSIS — Z853 Personal history of malignant neoplasm of breast: Secondary | ICD-10-CM

## 2022-04-05 DIAGNOSIS — Z170 Estrogen receptor positive status [ER+]: Secondary | ICD-10-CM | POA: Diagnosis not present

## 2022-04-05 DIAGNOSIS — Z79899 Other long term (current) drug therapy: Secondary | ICD-10-CM | POA: Insufficient documentation

## 2022-04-05 DIAGNOSIS — C50011 Malignant neoplasm of nipple and areola, right female breast: Secondary | ICD-10-CM | POA: Diagnosis not present

## 2022-04-05 DIAGNOSIS — Z5111 Encounter for antineoplastic chemotherapy: Secondary | ICD-10-CM | POA: Insufficient documentation

## 2022-04-05 DIAGNOSIS — C50912 Malignant neoplasm of unspecified site of left female breast: Secondary | ICD-10-CM | POA: Insufficient documentation

## 2022-04-05 LAB — CBC WITH DIFFERENTIAL (CANCER CENTER ONLY)
Abs Immature Granulocytes: 0.01 10*3/uL (ref 0.00–0.07)
Basophils Absolute: 0 10*3/uL (ref 0.0–0.1)
Basophils Relative: 1 %
Eosinophils Absolute: 0.2 10*3/uL (ref 0.0–0.5)
Eosinophils Relative: 6 %
HCT: 38.4 % (ref 36.0–46.0)
Hemoglobin: 12.5 g/dL (ref 12.0–15.0)
Immature Granulocytes: 0 %
Lymphocytes Relative: 43 %
Lymphs Abs: 1.8 10*3/uL (ref 0.7–4.0)
MCH: 28.2 pg (ref 26.0–34.0)
MCHC: 32.6 g/dL (ref 30.0–36.0)
MCV: 86.5 fL (ref 80.0–100.0)
Monocytes Absolute: 0.4 10*3/uL (ref 0.1–1.0)
Monocytes Relative: 10 %
Neutro Abs: 1.6 10*3/uL — ABNORMAL LOW (ref 1.7–7.7)
Neutrophils Relative %: 40 %
Platelet Count: 267 10*3/uL (ref 150–400)
RBC: 4.44 MIL/uL (ref 3.87–5.11)
RDW: 12.6 % (ref 11.5–15.5)
WBC Count: 4.1 10*3/uL (ref 4.0–10.5)
nRBC: 0 % (ref 0.0–0.2)

## 2022-04-05 LAB — LACTATE DEHYDROGENASE: LDH: 166 U/L (ref 98–192)

## 2022-04-05 LAB — CMP (CANCER CENTER ONLY)
ALT: 8 U/L (ref 0–44)
AST: 13 U/L — ABNORMAL LOW (ref 15–41)
Albumin: 4.2 g/dL (ref 3.5–5.0)
Alkaline Phosphatase: 101 U/L (ref 38–126)
Anion gap: 7 (ref 5–15)
BUN: 11 mg/dL (ref 6–20)
CO2: 26 mmol/L (ref 22–32)
Calcium: 9.5 mg/dL (ref 8.9–10.3)
Chloride: 108 mmol/L (ref 98–111)
Creatinine: 0.72 mg/dL (ref 0.44–1.00)
GFR, Estimated: >60 mL/min (ref >60)
Glucose, Bld: 101 mg/dL — ABNORMAL HIGH (ref 70–99)
Potassium: 4.2 mmol/L (ref 3.5–5.1)
Sodium: 141 mmol/L (ref 135–145)
Total Bilirubin: 0.5 mg/dL (ref 0.3–1.2)
Total Protein: 7.1 g/dL (ref 6.5–8.1)

## 2022-04-05 LAB — VITAMIN D 25 HYDROXY (VIT D DEFICIENCY, FRACTURES): Vit D, 25-Hydroxy: 50.42 ng/mL (ref 30–100)

## 2022-04-05 MED ORDER — SODIUM CHLORIDE 0.9% FLUSH
10.0000 mL | INTRAVENOUS | Status: DC | PRN
  Administered 2022-04-05: 10 mL

## 2022-04-05 MED ORDER — HEPARIN SOD (PORK) LOCK FLUSH 100 UNIT/ML IV SOLN
500.0000 [IU] | Freq: Once | INTRAVENOUS | Status: AC | PRN
  Administered 2022-04-05: 500 [IU]

## 2022-04-05 NOTE — Progress Notes (Signed)
START OFF PATHWAY REGIMEN - Breast   [id number]:Carboplatin AUC=6 IV D1 + Docetaxel 75 mg/m2 IV D1 q21 Days:   A cycle is every 21 days:     Docetaxel      Carboplatin   **Always confirm dose/schedule in your pharmacy ordering system**  Patient Characteristics: Postoperative without Neoadjuvant Therapy (Pathologic Staging), Invasive Disease, Adjuvant Therapy, HER2 Negative, ER Positive, Node Negative, pT1a, pN58m or pT1b-c, pN0/N133mor pT2 or Higher, pN0, Oncotype High Risk (? 26) Therapeutic Status: Postoperative without Neoadjuvant Therapy (Pathologic Staging) AJCC Grade: G2 AJCC N Category: pN0 AJCC M Category: cM0 ER Status: Positive (+) AJCC 8 Stage Grouping: IIA HER2 Status: Negative (-) Oncotype Dx Recurrence Score: 52 AJCC T Category: pT2 PR Status: Negative (-) Has this patient completed genomic testing<= Yes - Oncotype DX(R) Intent of Therapy: Curative Intent, Discussed with Patient

## 2022-04-05 NOTE — Patient Instructions (Signed)

## 2022-04-05 NOTE — Progress Notes (Unsigned)
Initial RN Navigator Patient Visit  Name: Jaime [name] Diagnosis:  Met with patient prior to their visit with MD. Hanley Seamen patient "Your Patient Navigator" handout which explains my role, areas in which I am able to help, and all the contact information for myself and the office. Also gave patient MD and Navigator business card. Reviewed with patient the general overview of expected course after initial diagnosis and time frame for all steps to be completed.  New patient packet given to patient which includes: orientation to office and staff; campus directory; education on My Chart and Advance Directives; and patient centered education on breast cancer.  Patient was seen in our office to establish care for a previous breast cancer. Since that visit she completed workup, biopsy, and surgery for a new breast cancer diagnosis. She is here today after her mastectomy to discuss treatment recommendations with Dr Marin Olp. She has a port from her last treatment for breast cancer.   Patient completed visit with Dr. Marin Olp.   Plan for Carbo/Taxotere times six cycles. Spoke to patient and since this is her third treatment with chemotherapy, she doesn't want to come in for a chemo education class. Will notify infusion nurse that she will need some education on her treatment day.  Patient understands all follow up procedures and expectations. They have my number to reach out for any further clarification or additional needs.

## 2022-04-05 NOTE — Progress Notes (Signed)
Hematology and Oncology Follow Up Visit  Jaime [name] [id number] June 10, 1960 62 y.o. 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  We first saw her on 12/31/2021.  At that time, she had a past history of bilateral breast cancer.  The first breast cancer was the right breast cancer which was a stage II-triple negative-diagnosed in 2004.  This was up in [place].  She received adjuvant chemotherapy along with radiation therapy.  She had a lumpectomy.  She had 1 positive lymph node.  In 2019, she then had a second breast cancer.  This is in the left breast.  She had 1 tumor that was HER2 positive and another that was HER2 negative.  She received 6 cycles of neoadjuvant chemotherapy with TCHP.  She did not require any radiation.  This because she had a mastectomy.  She now has a new breast cancer in the right breast.  She underwent a routine mammogram.  This showed an abnormality in the right breast.  She underwent a biopsy.  This was done on 01/31/2022.  The pathology report ([id number]) showed an invasive ductal carcinoma.  It was ER positive/PR negative/HER2 negative by FISH.  She then underwent a mastectomy.  This was done on 03/08/2022.  The pathology report ([id number]) showed an invasive ductal carcinoma.  It measured 2.8 cm.  All margins were negative.  Closest margin was 4 mm.  No lymph nodes were sampled.  She looks great.  She feels good.  We did Oncotype test.  This was incredibly high with an Oncotype score 52.  She clearly will need adjuvant chemotherapy.  I realize that she has had adjuvant chemotherapy in the past.   However, I think that this is definitely 1 tumor that would benefit from adjuvant therapy.  I would use Taxotere/carboplatinum.  I think this would be a very reasonable option.  She has had no problems with bowels or bladder.  She has had no nausea or vomiting.  She has had no fever.  There is been no lymphedema in the arms.  She has had no rashes.  She has had no leg swelling.  Overall, I would say performance status is probably [name].    Medications:  Current Outpatient Medications:    metoprolol tartrate (LOPRESSOR) 100 MG tablet, Take 100 mg by mouth daily., Disp: , Rfl:    aspirin EC 81 MG tablet, Take 81 mg by mouth daily. Swallow whole., Disp: , Rfl:    cephALEXin (KEFLEX) 500 MG capsule, Take 1 capsule (500 mg total) by mouth 3 (three) times daily., Disp: 21 capsule, Rfl: 0   ergocalciferol (VITAMIN D2) 1.25 MG (50000 UT) capsule, Take 1 capsule (50,000 Units total) by mouth once a week., Disp: 8 capsule, Rfl: 3   ibuprofen (ADVIL) 200 MG tablet, Take 400 mg by mouth every 6 (six) hours as needed for moderate pain., Disp: , Rfl:    latanoprost (XALATAN) 0.005 % ophthalmic solution, Place 1 drop into both eyes daily., Disp: , Rfl:    lidocaine-prilocaine (EMLA) cream, Apply 1 Application topically as needed., Disp: 30 g, Rfl: 3   nitroGLYCERIN (NITROSTAT) 0.4 MG SL tablet,  Place 1 tablet (0.4 mg total) under the tongue every 5 (five) minutes as needed for chest pain., Disp: 25 tablet, Rfl: 6  Allergies:  Allergies  Allergen Reactions   Atorvastatin     constipated   Latex Hives    Allergic reaction to condoms years ago.    Past Medical History, Surgical history, Social history, and Family History were reviewed and updated.  Review of Systems: Review of Systems  Constitutional: Negative.   HENT:  Negative.    Eyes: Negative.   Respiratory: Negative.    Cardiovascular: Negative.   Gastrointestinal: Negative.   Endocrine: Negative.   Genitourinary: Negative.     Musculoskeletal: Negative.   Skin: Negative.   Neurological: Negative.   Hematological: Negative.   Psychiatric/Behavioral: Negative.      Physical Exam:  weight is 229 lb (103.9 kg). Her oral temperature is 98.2 F (36.8 C). Her blood pressure is 145/84 (abnormal) and her pulse is 71. Her respiration is 18 and oxygen saturation is 100%.   Wt Readings from Last 3 Encounters:  04/05/22 229 lb (103.9 kg)  04/01/22 226 lb (102.5 kg)  03/08/22 230 lb (104.3 kg)    Physical Exam Vitals reviewed.  Constitutional:      Comments: Chest wall exam shows left chest wall with mastectomy.  She has no chest wall nodules.  There is no left axillary adenopathy.  Right chest wall shows the healing mastectomy.  There is no discharge.  There is no erythema.  There is no warmth or swelling.  There is no right axillary adenopathy.  HENT:     Head: Normocephalic and atraumatic.  Eyes:     Pupils: Pupils are equal, round, and reactive to light.  Cardiovascular:     Rate and Rhythm: Normal rate and regular rhythm.     Heart sounds: Normal heart sounds.  Pulmonary:     Effort: Pulmonary effort is normal.     Breath sounds: Normal breath sounds.  Abdominal:     General: Bowel sounds are normal.     Palpations: Abdomen is soft.  Musculoskeletal:        General: No tenderness or deformity. Normal range of motion.     Cervical back: Normal range of motion.  Lymphadenopathy:     Cervical: No cervical adenopathy.  Skin:    General: Skin is warm and dry.     Findings: No erythema or rash.  Neurological:     Mental Status: She is alert and oriented to person, place, and time.  Psychiatric:        Behavior: Behavior normal.        Thought Content: Thought content normal.        Judgment: Judgment normal.      Lab Results  Component Value Date   WBC 4.1 04/05/2022   HGB 12.5 04/05/2022   HCT 38.4 04/05/2022   MCV 86.5 04/05/2022   PLT 267 04/05/2022     Chemistry      Component Value  Date/Time   NA 141 04/05/2022 1046   NA 143 02/16/2022 1006   K 4.2 04/05/2022 1046   CL 108 04/05/2022 1046   CO2 26 04/05/2022 1046   BUN 11 04/05/2022 1046   BUN 13 02/16/2022 1006   CREATININE 0.72 04/05/2022 1046      Component Value Date/Time   CALCIUM 9.5 04/05/2022 1046   ALKPHOS 101 04/05/2022 1046   AST 13 (L) 04/05/2022 1046   ALT 8 04/05/2022 1046   BILITOT  0.5 04/05/2022 1046      Impression and Plan: Ms. Bowens is a very charming 62 year old postmenopausal African-American female.  This is her third breast cancer.  I do not believe this is a recurrence.  I believe this is a new breast cancer.  I am not sure as to why she keeps having breast cancer.  I think she underwent genetic testing.  I do not have these results as of yet.  Again, given the high Oncotype score, I think that she would be a good candidate for adjuvant chemotherapy.  I do believe that carboplatinum/Taxotere would be a reasonable approach for her.  I would use 6 cycles.  I think this would be aggressive.  Again, I just worry about there would be a no other recurrence or metastatic disease.  She will clearly need adjuvant hormonal therapy afterwards.  I also believe that Zometa would be a good idea to help prevent recurrence in her bones.  Thankfully, she already has a Port-A-Cath in.  This will certainly help [place] out.  We will try to get started next week.  I will plan to see her back when she has her second cycle of chemotherapy at the beginning of February.    Volanda Napoleon, MD 1/9/20241:13 PM

## 2022-04-06 ENCOUNTER — Other Ambulatory Visit: Payer: Self-pay

## 2022-04-07 ENCOUNTER — Encounter: Payer: Self-pay | Admitting: Family

## 2022-04-07 ENCOUNTER — Encounter: Payer: Self-pay | Admitting: Hematology & Oncology

## 2022-04-07 ENCOUNTER — Inpatient Hospital Stay: Payer: Medicare Other

## 2022-04-07 NOTE — Progress Notes (Signed)
Pharmacist Chemotherapy Monitoring - Initial Assessment    Anticipated start date: 04/14/22   The following has been reviewed per standard work regarding the patient's treatment regimen: The patient's diagnosis, treatment plan and drug doses, and organ/hematologic function Lab orders and baseline tests specific to treatment regimen  The treatment plan start date, drug sequencing, and pre-medications Prior authorization status  Patient's documented medication list, including drug-drug interaction screen and prescriptions for anti-emetics and supportive care specific to the treatment regimen The drug concentrations, fluid compatibility, administration routes, and timing of the medications to be used The patient's access for treatment and lifetime cumulative dose history, if applicable  The patient's medication allergies and previous infusion related reactions, if applicable   Changes made to treatment plan:  N/A  Follow up needed:  N/A   Claybon Jabs, [facility] 04/07/2022  12:47 PM

## 2022-04-07 NOTE — Progress Notes (Signed)
[place] [facility]  Initial Assessment   Jaime [name] is a 62 y.o. year old female contacted by phone. Clinical Social Work was referred by nurse navigator for assessment of psychosocial needs.   SDOH (Social Determinants of Health) assessments performed: No SDOH Interventions    Flowsheet Row Office Visit from 06/09/2021 in [place] at [place] Interventions   Tobacco Interventions Other (Comment)  [patient interested in quitting but declined support]       SDOH Screenings   Food Insecurity: No Food Insecurity (03/08/2022)  Housing: Low Risk  (03/08/2022)  Transportation Needs: No Transportation Needs (03/08/2022)  Utilities: Not At Risk (03/08/2022)  Depression (PHQ2-9): Low Risk  (06/09/2021)  Financial Resource Strain: Medium Risk (02/13/2019)  Tobacco Use: High Risk (04/05/2022)     Distress Screen completed: No     No data to display            Family/Social Information:  Housing Arrangement: patient lives with her husband. Family members/support persons in your life? Family Transportation concerns: no  Employment: Legally disabled.  Patient hasn't worked since 2019 due to her previous cancer diagnosis. Income source: Banker concerns: Yes, due to illness and/or loss of work during treatment Type of concern: Medical bills Food access concerns: no Religious or spiritual practice: Not known Services Currently in place:  Medicare  Coping/ Adjustment to diagnosis: Patient understands treatment plan and what happens next? 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 Patient enjoys time with family/ friends Current coping skills/ strengths: Capable of independent living , Armed forces logistics/support/administrative officer , General fund of knowledge , Motivation for treatment/growth , and  Supportive family/friends     SUMMARY: Current SDOH Barriers:  Financial constraints related to inability to work.  Clinical Social Work Clinical Goal(s):  Explore community resource options for unmet needs related to:  Financial Strain   Interventions: Discussed common feeling and emotions when being diagnosed with cancer, and the importance of support during treatment Informed patient of the support team roles and support services at [facility] Provided [place] contact information and encouraged patient to call with any questions or concerns Provided patient with information about available resources and grants.  Will make referral for the [employer].  CSW will also have available at the [place] in [facility] breast cancer grant applications for her next appointment.   Follow Up Plan: CSW will follow-up with patient by phone  Patient verbalizes understanding of plan: Yes    Rodman Pickle Abbigail Anstey, LCSW

## 2022-04-13 ENCOUNTER — Ambulatory Visit: Payer: Medicare Other | Admitting: Dietician

## 2022-04-13 NOTE — Progress Notes (Addendum)
Patient scheduled for a new patient consult. First attempt to reach. Provided my cell# on voice mail and in text to return call to reschedule nutrition consult.  Patient has a PMHx of two other breast cancers and this would be her third round of chemo.  Per chart notes she declined chemo ed.  Weight has been stable and not NIS were documented.   Patient returned call and states she is feeling well at this time. Starts chemo tomorrow and each treatment has been different so she isn't sure how she will react.  She denies NIS at this time.  Trying to eat healthier (reducing fried foods and breads, more vegetables). Eats 3 meals/day. No food allergies, describes some lactose intolerance. Doesn't like green peas, not crazy about humus.  Gets some calcium with [place] yogurt each morning, and cheese on salads and in eggs. Fluid intake: drinks lots of water 3-4 bottles, 2 coffee each morning with cream and sugar, rarely has Pepsi..no alcohol. Takes a Vit D2 weekly, no other supplements at this time.      INTERVENTION:   Relayed that nutrition services are wrap around service provided at no charge and encouraged continued communication if experiencing any nutritional impact symptoms (NIS).  Relayed importance of adequate nourishment with calorie and protein energy intake  with nutrient dense foods when possible to maintain weight/strength and QOL.    Emailed Nutrition Tip sheet  for  Nutrition During Cancer Treatment, High Protein  foods with contact information.  MONITORING, EVALUATION, GOAL: weight trends, nutrition impact symptoms, PO intake, labs  Goal is weight maintenance, although patient would like to lose a few pounds.  Next Visit: PRN at patient or provider request.  April Manson, RDN, LDN Registered Dietitian, [place] Part Time Remote (Usual office hours: Tuesday-Thursday) Mobile: [phone]

## 2022-04-14 ENCOUNTER — Inpatient Hospital Stay: Payer: Medicare Other

## 2022-04-14 ENCOUNTER — Encounter: Payer: Self-pay | Admitting: *Deleted

## 2022-04-14 VITALS — BP 125/54 | HR 77 | Temp 98.1°F | Resp 18

## 2022-04-14 DIAGNOSIS — C50011 Malignant neoplasm of nipple and areola, right female breast: Secondary | ICD-10-CM

## 2022-04-14 DIAGNOSIS — Z5111 Encounter for antineoplastic chemotherapy: Secondary | ICD-10-CM | POA: Diagnosis not present

## 2022-04-14 LAB — CMP (CANCER CENTER ONLY)
ALT: 9 U/L (ref 0–44)
AST: 14 U/L — ABNORMAL LOW (ref 15–41)
Albumin: 4.1 g/dL (ref 3.5–5.0)
Alkaline Phosphatase: 89 U/L (ref 38–126)
Anion gap: 7 (ref 5–15)
BUN: 13 mg/dL (ref 6–20)
CO2: 28 mmol/L (ref 22–32)
Calcium: 9.8 mg/dL (ref 8.9–10.3)
Chloride: 106 mmol/L (ref 98–111)
Creatinine: 0.67 mg/dL (ref 0.44–1.00)
GFR, Estimated: >60 mL/min (ref >60)
Glucose, Bld: 97 mg/dL (ref 70–99)
Potassium: 4.1 mmol/L (ref 3.5–5.1)
Sodium: 141 mmol/L (ref 135–145)
Total Bilirubin: 0.6 mg/dL (ref 0.3–1.2)
Total Protein: 6.9 g/dL (ref 6.5–8.1)

## 2022-04-14 LAB — CBC WITH DIFFERENTIAL (CANCER CENTER ONLY)
Abs Immature Granulocytes: 0.01 10*3/uL (ref 0.00–0.07)
Basophils Absolute: 0 10*3/uL (ref 0.0–0.1)
Basophils Relative: 1 %
Eosinophils Absolute: 0.2 10*3/uL (ref 0.0–0.5)
Eosinophils Relative: 4 %
HCT: 38.6 % (ref 36.0–46.0)
Hemoglobin: 12.7 g/dL (ref 12.0–15.0)
Immature Granulocytes: 0 %
Lymphocytes Relative: 46 %
Lymphs Abs: 1.7 10*3/uL (ref 0.7–4.0)
MCH: 28.3 pg (ref 26.0–34.0)
MCHC: 32.9 g/dL (ref 30.0–36.0)
MCV: 86.2 fL (ref 80.0–100.0)
Monocytes Absolute: 0.4 10*3/uL (ref 0.1–1.0)
Monocytes Relative: 12 %
Neutro Abs: 1.4 10*3/uL — ABNORMAL LOW (ref 1.7–7.7)
Neutrophils Relative %: 37 %
Platelet Count: 246 10*3/uL (ref 150–400)
RBC: 4.48 MIL/uL (ref 3.87–5.11)
RDW: 12.8 % (ref 11.5–15.5)
WBC Count: 3.6 10*3/uL — ABNORMAL LOW (ref 4.0–10.5)
nRBC: 0 % (ref 0.0–0.2)

## 2022-04-14 MED ORDER — HEPARIN SOD (PORK) LOCK FLUSH 100 UNIT/ML IV SOLN: 250.0000 [IU] | Freq: Once | INTRAVENOUS | Status: DC | PRN

## 2022-04-14 MED ORDER — SODIUM CHLORIDE 0.9 % IV SOLN: Freq: Once | INTRAVENOUS | Status: AC

## 2022-04-14 MED ORDER — HEPARIN SOD (PORK) LOCK FLUSH 100 UNIT/ML IV SOLN
500.0000 [IU] | Freq: Once | INTRAVENOUS | Status: AC | PRN
  Administered 2022-04-14: 500 [IU]

## 2022-04-14 MED ORDER — SODIUM CHLORIDE 0.9 % IV SOLN
150.0000 mg | Freq: Once | INTRAVENOUS | Status: AC
  Administered 2022-04-14: 150 mg via INTRAVENOUS
  Filled 2022-04-14: qty 150

## 2022-04-14 MED ORDER — DEXAMETHASONE 4 MG PO TABS: ORAL_TABLET | ORAL | 1 refills | Status: DC

## 2022-04-14 MED ORDER — SODIUM CHLORIDE 0.9 % IV SOLN
10.0000 mg | Freq: Once | INTRAVENOUS | Status: AC
  Administered 2022-04-14: 10 mg via INTRAVENOUS
  Filled 2022-04-14: qty 10

## 2022-04-14 MED ORDER — PALONOSETRON HCL INJECTION 0.25 MG/5ML
0.2500 mg | Freq: Once | INTRAVENOUS | Status: AC
  Administered 2022-04-14: 0.25 mg via INTRAVENOUS
  Filled 2022-04-14: qty 5

## 2022-04-14 MED ORDER — ONDANSETRON HCL 8 MG PO TABS: 8.0000 mg | ORAL_TABLET | Freq: Three times a day (TID) | ORAL | 1 refills | Status: DC | PRN

## 2022-04-14 MED ORDER — SODIUM CHLORIDE 0.9 % IV SOLN
75.0000 mg/m2 | Freq: Once | INTRAVENOUS | Status: AC
  Administered 2022-04-14: 160 mg via INTRAVENOUS
  Filled 2022-04-14: qty 16

## 2022-04-14 MED ORDER — SODIUM CHLORIDE 0.9 % IV SOLN
800.0000 mg | Freq: Once | INTRAVENOUS | Status: AC
  Administered 2022-04-14: 800 mg via INTRAVENOUS
  Filled 2022-04-14: qty 80

## 2022-04-14 MED ORDER — SODIUM CHLORIDE 0.9% FLUSH: 3.0000 mL | INTRAVENOUS | Status: DC | PRN

## 2022-04-14 MED ORDER — PROCHLORPERAZINE MALEATE 10 MG PO TABS: 10.0000 mg | ORAL_TABLET | Freq: Four times a day (QID) | ORAL | 1 refills | Status: DC | PRN

## 2022-04-14 MED ORDER — SODIUM CHLORIDE 0.9% FLUSH
10.0000 mL | INTRAVENOUS | Status: DC | PRN
  Administered 2022-04-14: 10 mL

## 2022-04-14 NOTE — Patient Instructions (Signed)
[facility] [place]  Discharge Instructions: Thank you for choosing [facility] to provide your oncology and hematology care.   If you have a lab appointment with the [facility], please go directly to the [facility] and check in at the registration area.  Wear comfortable clothing and clothing appropriate for easy access to any Portacath or PICC line.   We strive to give you quality time with your provider. You may need to reschedule your appointment if you arrive late (15 or more minutes).  Arriving late affects you and other patients whose appointments are after yours.  Also, if you miss three or more appointments without notifying the office, you may be dismissed from the clinic at the provider's discretion.      For prescription refill requests, have your pharmacy contact our office and allow 72 hours for refills to be completed.    Today you received the following chemotherapy and/or immunotherapy agents Taxotere, Paraplatin.      To help prevent nausea and vomiting after your treatment, we encourage you to take your nausea medication as directed.  BELOW ARE SYMPTOMS THAT SHOULD BE REPORTED IMMEDIATELY: *FEVER GREATER THAN 100.4 F (38 C) OR HIGHER *CHILLS OR SWEATING *NAUSEA AND VOMITING THAT IS NOT CONTROLLED WITH YOUR NAUSEA MEDICATION *UNUSUAL SHORTNESS OF BREATH *UNUSUAL BRUISING OR BLEEDING *URINARY PROBLEMS (pain or burning when urinating, or frequent urination) *BOWEL PROBLEMS (unusual diarrhea, constipation, pain near the anus) TENDERNESS IN MOUTH AND THROAT WITH OR WITHOUT PRESENCE OF ULCERS (sore throat, sores in mouth, or a toothache) UNUSUAL RASH, SWELLING OR PAIN  UNUSUAL VAGINAL DISCHARGE OR ITCHING   Items with * indicate a potential emergency and should be followed up as soon as possible or go to the Emergency Department if any problems should occur.  Please show the CHEMOTHERAPY ALERT CARD or IMMUNOTHERAPY ALERT CARD at  check-in to the Emergency Department and triage nurse. Should you have questions after your visit or need to cancel or reschedule your appointment, please contact [facility]  [phone] and follow the prompts.  Office hours are 8:00 a.m. to 4:30 p.m. Monday - Friday. Please note that voicemails left after 4:00 p.m. may not be returned until the following business day.  We are closed weekends and major holidays. You have access to a nurse at all times for urgent questions. Please call the main number to the clinic [phone] and follow the prompts.  For any non-urgent questions, you may also contact your provider using MyChart. We now offer e-Visits for anyone 32 and older to request care online for non-urgent symptoms. For details visit [email or web address].   Also download the MyChart app! Go to the app store, search "MyChart", open the app, select [facility], and log in with your MyChart username and password.

## 2022-04-14 NOTE — Patient Instructions (Signed)

## 2022-04-14 NOTE — Progress Notes (Signed)
OK to treat with ANC value from today per Dr. Marin Olp.

## 2022-04-14 NOTE — Progress Notes (Signed)
Patient here to begin treatment. She is scheduled for her next cycle and follow up with Dr Marin Olp on 05/03/2022.  Oncology Nurse Navigator Documentation     04/14/2022   11:30 AM  Oncology Nurse Navigator Flowsheets  Phase of Treatment Chemo  Chemotherapy Actual Start Date: 04/14/2022  Chemotherapy Expected End Date: 07/29/2022  Navigator Follow Up Date: 05/03/2022  Navigator Follow Up Reason: Follow-up Appointment;Chemotherapy  Navigator Restaurant manager, fast food Encounter Type Treatment  Patient Visit Type MedOnc  Treatment Phase First Chemo Tx  Barriers/Navigation Needs Coordination of Care;Education  Interventions None Required  Acuity Level 2-Minimal Needs (1-2 Barriers Identified)  Support Groups/Services Friends and Family  Time Spent with Patient 15

## 2022-04-15 ENCOUNTER — Inpatient Hospital Stay: Payer: Medicare Other

## 2022-04-15 ENCOUNTER — Telehealth: Payer: Self-pay

## 2022-04-15 ENCOUNTER — Other Ambulatory Visit: Payer: Self-pay

## 2022-04-15 VITALS — BP 145/58 | HR 82 | Temp 98.0°F | Resp 17

## 2022-04-15 DIAGNOSIS — C50011 Malignant neoplasm of nipple and areola, right female breast: Secondary | ICD-10-CM

## 2022-04-15 DIAGNOSIS — Z5111 Encounter for antineoplastic chemotherapy: Secondary | ICD-10-CM | POA: Diagnosis not present

## 2022-04-15 MED ORDER — ONDANSETRON 8 MG PO TBDP: 8.0000 mg | ORAL_TABLET | Freq: Three times a day (TID) | ORAL | 0 refills | Status: DC | PRN

## 2022-04-15 MED ORDER — PEGFILGRASTIM-PBBK 6 MG/0.6ML ~~LOC~~ SOSY
6.0000 mg | PREFILLED_SYRINGE | Freq: Once | SUBCUTANEOUS | Status: AC
  Administered 2022-04-15: 6 mg via SUBCUTANEOUS
  Filled 2022-04-15: qty 0.6

## 2022-04-15 NOTE — Patient Instructions (Signed)

## 2022-04-15 NOTE — Telephone Encounter (Signed)
CSW spoke with patient regarding breast cancer grant applications.  It was agreed they will be mailed to her.  Also made referral to Mort Sawyers for the [employer].  Patient stated she was applying for Medicaid also.

## 2022-04-18 NOTE — Progress Notes (Addendum)
Called to speak with patient regarding the [employer], reached her voicemail, left her a message asking that she return my call.  -Spoke with patient, she will sign and bring in proof of income at her next apt on 02/06

## 2022-04-20 LAB — COLOGUARD: COLOGUARD: NEGATIVE

## 2022-05-03 ENCOUNTER — Inpatient Hospital Stay: Payer: Medicare Other | Attending: Family

## 2022-05-03 ENCOUNTER — Inpatient Hospital Stay: Payer: Medicare Other

## 2022-05-03 ENCOUNTER — Other Ambulatory Visit: Payer: Self-pay

## 2022-05-03 ENCOUNTER — Inpatient Hospital Stay (HOSPITAL_BASED_OUTPATIENT_CLINIC_OR_DEPARTMENT_OTHER): Payer: Medicare Other | Admitting: Hematology & Oncology

## 2022-05-03 ENCOUNTER — Encounter: Payer: Self-pay | Admitting: Hematology & Oncology

## 2022-05-03 ENCOUNTER — Encounter: Payer: Self-pay | Admitting: *Deleted

## 2022-05-03 VITALS — BP 137/68 | HR 90 | Temp 98.0°F | Resp 18 | Wt 223.0 lb

## 2022-05-03 DIAGNOSIS — Z79899 Other long term (current) drug therapy: Secondary | ICD-10-CM | POA: Diagnosis not present

## 2022-05-03 DIAGNOSIS — Z853 Personal history of malignant neoplasm of breast: Secondary | ICD-10-CM

## 2022-05-03 DIAGNOSIS — C50011 Malignant neoplasm of nipple and areola, right female breast: Secondary | ICD-10-CM

## 2022-05-03 DIAGNOSIS — Z170 Estrogen receptor positive status [ER+]: Secondary | ICD-10-CM | POA: Diagnosis not present

## 2022-05-03 DIAGNOSIS — Z5111 Encounter for antineoplastic chemotherapy: Secondary | ICD-10-CM | POA: Insufficient documentation

## 2022-05-03 DIAGNOSIS — Z9011 Acquired absence of right breast and nipple: Secondary | ICD-10-CM | POA: Diagnosis not present

## 2022-05-03 DIAGNOSIS — C50911 Malignant neoplasm of unspecified site of right female breast: Secondary | ICD-10-CM | POA: Insufficient documentation

## 2022-05-03 LAB — CBC WITH DIFFERENTIAL (CANCER CENTER ONLY)
Abs Immature Granulocytes: 0.03 10*3/uL (ref 0.00–0.07)
Basophils Absolute: 0 10*3/uL (ref 0.0–0.1)
Basophils Relative: 0 %
Eosinophils Absolute: 0 10*3/uL (ref 0.0–0.5)
Eosinophils Relative: 0 %
HCT: 36.6 % (ref 36.0–46.0)
Hemoglobin: 12 g/dL (ref 12.0–15.0)
Immature Granulocytes: 0 %
Lymphocytes Relative: 15 %
Lymphs Abs: 1.1 10*3/uL (ref 0.7–4.0)
MCH: 28 pg (ref 26.0–34.0)
MCHC: 32.8 g/dL (ref 30.0–36.0)
MCV: 85.5 fL (ref 80.0–100.0)
Monocytes Absolute: 0.8 10*3/uL (ref 0.1–1.0)
Monocytes Relative: 10 %
Neutro Abs: 5.6 10*3/uL (ref 1.7–7.7)
Neutrophils Relative %: 75 %
Platelet Count: 279 10*3/uL (ref 150–400)
RBC: 4.28 MIL/uL (ref 3.87–5.11)
RDW: 13 % (ref 11.5–15.5)
WBC Count: 7.5 10*3/uL (ref 4.0–10.5)
nRBC: 0 % (ref 0.0–0.2)

## 2022-05-03 LAB — CMP (CANCER CENTER ONLY)
ALT: 8 U/L (ref 0–44)
AST: 11 U/L — ABNORMAL LOW (ref 15–41)
Albumin: 4.2 g/dL (ref 3.5–5.0)
Alkaline Phosphatase: 103 U/L (ref 38–126)
Anion gap: 9 (ref 5–15)
BUN: 20 mg/dL (ref 6–20)
CO2: 24 mmol/L (ref 22–32)
Calcium: 10.1 mg/dL (ref 8.9–10.3)
Chloride: 105 mmol/L (ref 98–111)
Creatinine: 0.63 mg/dL (ref 0.44–1.00)
GFR, Estimated: >60 mL/min (ref >60)
Glucose, Bld: 114 mg/dL — ABNORMAL HIGH (ref 70–99)
Potassium: 4.1 mmol/L (ref 3.5–5.1)
Sodium: 138 mmol/L (ref 135–145)
Total Bilirubin: 0.4 mg/dL (ref 0.3–1.2)
Total Protein: 7.2 g/dL (ref 6.5–8.1)

## 2022-05-03 LAB — LACTATE DEHYDROGENASE: LDH: 210 U/L — ABNORMAL HIGH (ref 98–192)

## 2022-05-03 MED ORDER — SODIUM CHLORIDE 0.9 % IV SOLN
150.0000 mg | Freq: Once | INTRAVENOUS | Status: AC
  Administered 2022-05-03: 150 mg via INTRAVENOUS
  Filled 2022-05-03: qty 150

## 2022-05-03 MED ORDER — HEPARIN SOD (PORK) LOCK FLUSH 100 UNIT/ML IV SOLN
500.0000 [IU] | Freq: Once | INTRAVENOUS | Status: AC | PRN
  Administered 2022-05-03: 500 [IU]

## 2022-05-03 MED ORDER — SODIUM CHLORIDE 0.9 % IV SOLN
10.0000 mg | Freq: Once | INTRAVENOUS | Status: AC
  Administered 2022-05-03: 10 mg via INTRAVENOUS
  Filled 2022-05-03: qty 10

## 2022-05-03 MED ORDER — SODIUM CHLORIDE 0.9 % IV SOLN
800.0000 mg | Freq: Once | INTRAVENOUS | Status: AC
  Administered 2022-05-03: 800 mg via INTRAVENOUS
  Filled 2022-05-03: qty 80

## 2022-05-03 MED ORDER — PALONOSETRON HCL INJECTION 0.25 MG/5ML
0.2500 mg | Freq: Once | INTRAVENOUS | Status: AC
  Administered 2022-05-03: 0.25 mg via INTRAVENOUS
  Filled 2022-05-03: qty 5

## 2022-05-03 MED ORDER — SODIUM CHLORIDE 0.9% FLUSH
10.0000 mL | INTRAVENOUS | Status: DC | PRN
  Administered 2022-05-03: 10 mL

## 2022-05-03 MED ORDER — ZOLEDRONIC ACID 4 MG/100ML IV SOLN
4.0000 mg | Freq: Once | INTRAVENOUS | Status: AC
  Administered 2022-05-03: 4 mg via INTRAVENOUS
  Filled 2022-05-03: qty 100

## 2022-05-03 MED ORDER — SODIUM CHLORIDE 0.9 % IV SOLN: Freq: Once | INTRAVENOUS | Status: AC

## 2022-05-03 MED ORDER — SODIUM CHLORIDE 0.9 % IV SOLN
75.0000 mg/m2 | Freq: Once | INTRAVENOUS | Status: AC
  Administered 2022-05-03: 160 mg via INTRAVENOUS
  Filled 2022-05-03: qty 16

## 2022-05-03 NOTE — Progress Notes (Signed)
[facility] Hematology and Oncology Follow Up Visit  Jaime [name] [id number] November 27, 1960 63 y.o. 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  She tolerated her first cycle of chemotherapy pretty well.  She did have some arthralgias with the Neulasta.  I told her that she can try some Claritin to see if this may help.  She also has some decrease in taste.  I told her to rinse her mouth out with water and baking soda let see if this may help her.  She has had no problems with nausea or vomiting.  She has had no diarrhea.  She has had no problems with rashes.  There is no bleeding.  Overall, I would have to say that her performance status is probably [name].    Medications:  Current Outpatient Medications:    aspirin EC 81 MG tablet, Take 81 mg by mouth daily. Swallow whole., Disp: , Rfl:    dexamethasone (DECADRON) 4 MG tablet, Take 2 tabs by mouth 2 times daily starting day before chemo. Then take 2 tabs daily for 2 days starting day after chemo. Take with food., Disp: 30 tablet, Rfl: 1   ergocalciferol (VITAMIN D2) 1.25 MG (50000 UT) capsule, Take 1 capsule (50,000 Units total) by mouth once a week., Disp: 8 capsule, Rfl: 3   ibuprofen (ADVIL) 200 MG tablet, Take 400 mg by mouth every 6 (six) hours as needed for moderate pain., Disp: , Rfl:    latanoprost (XALATAN) 0.005 % ophthalmic solution, Place 1 drop into both eyes daily., Disp: , Rfl:    lidocaine-prilocaine (EMLA) cream, Apply 1 Application topically as needed., Disp: 30 g, Rfl: 3   metoprolol tartrate (LOPRESSOR) 100 MG tablet, Take 100 mg by mouth daily., Disp: ,  Rfl:    nitroGLYCERIN (NITROSTAT) 0.4 MG SL tablet, Place 1 tablet (0.4 mg total) under the tongue every 5 (five) minutes as needed for chest pain., Disp: 25 tablet, Rfl: 6   ondansetron (ZOFRAN-ODT) 8 MG disintegrating tablet, Take 1 tablet (8 mg total) by mouth every 8 (eight) hours as needed for nausea or vomiting., Disp: 20 tablet, Rfl: 0   prochlorperazine (COMPAZINE) 10 MG tablet, Take 1 tablet (10 mg total) by mouth every 6 (six) hours as needed for nausea or vomiting., Disp: 30 tablet, Rfl: 1  Allergies:  Allergies  Allergen Reactions   Atorvastatin     constipated   Latex Hives    Allergic reaction to condoms years ago.    Past Medical History, Surgical history, Social history, and Family History were reviewed and updated.  Review of Systems: Review of Systems  Constitutional: Negative.   HENT:  Negative.    Eyes: Negative.   Respiratory: Negative.    Cardiovascular: Negative.   Gastrointestinal: Negative.   Endocrine: Negative.   Genitourinary: Negative.    Musculoskeletal: Negative.   Skin: Negative.   Neurological: Negative.   Hematological: Negative.   Psychiatric/Behavioral: Negative.      Physical Exam:  weight is 223 lb (101.2 kg). Her oral temperature is 98 F (36.7 C). Her blood pressure is 137/68 and her pulse is 90. Her respiration is 18  and oxygen saturation is 100%.   Wt Readings from Last 3 Encounters:  05/03/22 223 lb (101.2 kg)  04/05/22 229 lb (103.9 kg)  04/01/22 226 lb (102.5 kg)    Physical Exam Vitals reviewed.  Constitutional:      Comments: Chest wall exam shows left chest wall with mastectomy.  She has no chest wall nodules.  There is no left axillary adenopathy.  Right chest wall shows the healing mastectomy.  There is no discharge.  There is no erythema.  There is no warmth or swelling.  There is no right axillary adenopathy.  HENT:     Head: Normocephalic and atraumatic.  Eyes:     Pupils: Pupils are equal, round, and reactive to  light.  Cardiovascular:     Rate and Rhythm: Normal rate and regular rhythm.     Heart sounds: Normal heart sounds.  Pulmonary:     Effort: Pulmonary effort is normal.     Breath sounds: Normal breath sounds.  Abdominal:     General: Bowel sounds are normal.     Palpations: Abdomen is soft.  Musculoskeletal:        General: No tenderness or deformity. Normal range of motion.     Cervical back: Normal range of motion.  Lymphadenopathy:     Cervical: No cervical adenopathy.  Skin:    General: Skin is warm and dry.     Findings: No erythema or rash.  Neurological:     Mental Status: She is alert and oriented to person, place, and time.  Psychiatric:        Behavior: Behavior normal.        Thought Content: Thought content normal.        Judgment: Judgment normal.      Lab Results  Component Value Date   WBC 7.5 05/03/2022   HGB 12.0 05/03/2022   HCT 36.6 05/03/2022   MCV 85.5 05/03/2022   PLT 279 05/03/2022     Chemistry      Component Value Date/Time   NA 138 05/03/2022 1025   NA 143 02/16/2022 1006   K 4.1 05/03/2022 1025   CL 105 05/03/2022 1025   CO2 24 05/03/2022 1025   BUN 20 05/03/2022 1025   BUN 13 02/16/2022 1006   CREATININE 0.63 05/03/2022 1025      Component Value Date/Time   CALCIUM 10.1 05/03/2022 1025   ALKPHOS 103 05/03/2022 1025   AST 11 (L) 05/03/2022 1025   ALT 8 05/03/2022 1025   BILITOT 0.4 05/03/2022 1025      Impression and Plan: Jaime [name] is a very charming 62 year old postmenopausal African-American female.  This is her third breast cancer.  I do not believe this is a recurrence.  I believe this is a new breast cancer.  I am not sure as to why she keeps having breast cancer.  I think she underwent genetic testing.   We will go ahead with her second cycle of treatment.  I think she is done quite well.  We will plan for third cycle in 3 weeks.  Again, we will give her 6 cycles given that there is such a high Oncotype  score.   Volanda Napoleon, MD 2/6/202411:11 AM

## 2022-05-03 NOTE — Progress Notes (Unsigned)
Patient did well with first treatment and is proceeding with cycle two. She required some assistance signing and submitting forms for financial help through the [employer]. Form signed and financial info given to [employer].   Oncology Nurse Navigator Documentation     05/03/2022   11:30 AM  Oncology Nurse Navigator Flowsheets  Navigator Follow Up Date: 05/24/2022  Navigator Follow Up Reason: Follow-up Appointment;Chemotherapy  Navigator Location [facility]  Navigator Encounter Type Treatment;Appt/Treatment Plan Review  Patient Visit Type MedOnc  Treatment Phase Active Tx  Barriers/Navigation Needs Coordination of Care;Education  Interventions Coordination of Care  Acuity Level 2-Minimal Needs (1-2 Barriers Identified)  Coordination of Care Other  Support Groups/Services Friends and Family  Time Spent with Patient 30

## 2022-05-03 NOTE — Progress Notes (Signed)
Enrolled patient into [employer]

## 2022-05-03 NOTE — Patient Instructions (Addendum)
[facility] [place]  Discharge Instructions: Thank you for choosing [facility] to provide your oncology and hematology care.   If you have a lab appointment with the [facility], please go directly to the [facility] and check in at the registration area.  Wear comfortable clothing and clothing appropriate for easy access to any Portacath or PICC line.   We strive to give you quality time with your provider. You may need to reschedule your appointment if you arrive late (15 or more minutes).  Arriving late affects you and other patients whose appointments are after yours.  Also, if you miss three or more appointments without notifying the office, you may be dismissed from the clinic at the provider's discretion.      For prescription refill requests, have your pharmacy contact our office and allow 72 hours for refills to be completed.    Today you received the following chemotherapy and/or immunotherapy agents Taxotere/Carboplatin      To help prevent nausea and vomiting after your treatment, we encourage you to take your nausea medication as directed.  BELOW ARE SYMPTOMS THAT SHOULD BE REPORTED IMMEDIATELY: *FEVER GREATER THAN 100.4 F (38 C) OR HIGHER *CHILLS OR SWEATING *NAUSEA AND VOMITING THAT IS NOT CONTROLLED WITH YOUR NAUSEA MEDICATION *UNUSUAL SHORTNESS OF BREATH *UNUSUAL BRUISING OR BLEEDING *URINARY PROBLEMS (pain or burning when urinating, or frequent urination) *BOWEL PROBLEMS (unusual diarrhea, constipation, pain near the anus) TENDERNESS IN MOUTH AND THROAT WITH OR WITHOUT PRESENCE OF ULCERS (sore throat, sores in mouth, or a toothache) UNUSUAL RASH, SWELLING OR PAIN  UNUSUAL VAGINAL DISCHARGE OR ITCHING   Items with * indicate a potential emergency and should be followed up as soon as possible or go to the Emergency Department if any problems should occur.  Please show the CHEMOTHERAPY ALERT CARD or IMMUNOTHERAPY ALERT CARD  at check-in to the Emergency Department and triage nurse. Should you have questions after your visit or need to cancel or reschedule your appointment, please contact [facility]  [phone] and follow the prompts.  Office hours are 8:00 a.m. to 4:30 p.m. Monday - Friday. Please note that voicemails left after 4:00 p.m. may not be returned until the following business day.  We are closed weekends and major holidays. You have access to a nurse at all times for urgent questions. Please call the main number to the clinic [phone] and follow the prompts.  For any non-urgent questions, you may also contact your provider using MyChart. We now offer e-Visits for anyone 35 and older to request care online for non-urgent symptoms. For details visit [email or web address].   Also download the MyChart app! Go to the app store, search "MyChart", open the app, select [facility], and log in with your MyChart username and password.  Zoledronic Acid Injection (Cancer) (ZOMETA) What is this medication? ZOLEDRONIC ACID (ZOE le dron ik AS id) treats high calcium levels in the blood caused by cancer. It may also be used with chemotherapy to treat weakened bones caused by cancer. It works by slowing down the release of calcium from bones. This lowers calcium levels in your blood. It also makes your bones stronger and less likely to break (fracture). It belongs to a group of medications called bisphosphonates. This medicine may be used for other purposes; ask your health care provider or pharmacist if you have questions. COMMON BRAND NAME(S): Zometa, Zometa Powder What should I tell my care team  before I take this medication? They need to know if you have any of these conditions: Dehydration Dental disease Kidney disease Liver disease Low levels of calcium in the blood Lung or breathing disease, such as asthma Receiving steroids, such as dexamethasone or prednisone An  unusual or allergic reaction to zoledronic acid, other medications, foods, dyes, or preservatives Pregnant or trying to get pregnant Breast-feeding How should I use this medication? This medication is injected into a vein. It is given by your care team in a hospital or clinic setting. Talk to your care team about the use of this medication in children. Special care may be needed. Overdosage: If you think you have taken too much of this medicine contact a poison control center or emergency room at once. NOTE: This medicine is only for you. Do not share this medicine with others. What if I miss a dose? Keep appointments for follow-up doses. It is important not to miss your dose. Call your care team if you are unable to keep an appointment. What may interact with this medication? Certain antibiotics given by injection Diuretics, such as bumetanide, furosemide NSAIDs, medications for pain and inflammation, such as ibuprofen or naproxen Teriparatide Thalidomide This list may not describe all possible interactions. Give your health care provider a list of all the medicines, herbs, non-prescription drugs, or dietary supplements you use. Also tell them if you smoke, drink alcohol, or use illegal drugs. Some items may interact with your medicine. What should I watch for while using this medication? Visit your care team for regular checks on your progress. It may be some time before you see the benefit from this medication. Some people who take this medication have severe bone, joint, or muscle pain. This medication may also increase your risk for jaw problems or a broken thigh bone. Tell your care team right away if you have severe pain in your jaw, bones, joints, or muscles. Tell you care team if you have any pain that does not go away or that gets worse. Tell your dentist and dental surgeon that you are taking this medication. You should not have major dental surgery while on this medication. See your  dentist to have a dental exam and fix any dental problems before starting this medication. Take good care of your teeth while on this medication. Make sure you see your dentist for regular follow-up appointments. You should make sure you get enough calcium and vitamin D while you are taking this medication. Discuss the foods you eat and the vitamins you take with your care team. Check with your care team if you have severe diarrhea, nausea, and vomiting, or if you sweat a lot. The loss of too much body fluid may make it dangerous for you to take this medication. You may need bloodwork while taking this medication. Talk to your care team if you wish to become pregnant or think you might be pregnant. This medication can cause serious birth defects. What side effects may I notice from receiving this medication? Side effects that you should report to your care team as soon as possible: Allergic reactions--skin rash, itching, hives, swelling of the face, lips, tongue, or throat Kidney injury--decrease in the amount of urine, swelling of the ankles, hands, or feet Low calcium level--muscle pain or cramps, confusion, tingling, or numbness in the hands or feet Osteonecrosis of the jaw--pain, swelling, or redness in the mouth, numbness of the jaw, poor healing after dental work, unusual discharge from the mouth, visible bones  in the mouth Severe bone, joint, or muscle pain Side effects that usually do not require medical attention (report to your care team if they continue or are bothersome): Constipation Fatigue Fever Loss of appetite Nausea Stomach pain This list may not describe all possible side effects. Call your doctor for medical advice about side effects. You may report side effects to FDA at 1-800-FDA-1088. Where should I keep my medication? This medication is given in a hospital or clinic. It will not be stored at home. NOTE: This sheet is a summary. It may not cover all possible information.  If you have questions about this medicine, talk to your doctor, pharmacist, or health care provider.  2023 Elsevier/Gold Standard (2021-04-29 00:00:00)

## 2022-05-04 ENCOUNTER — Telehealth: Payer: Self-pay

## 2022-05-04 ENCOUNTER — Encounter: Payer: Self-pay | Admitting: Hematology & Oncology

## 2022-05-04 ENCOUNTER — Encounter: Payer: Self-pay | Admitting: Family

## 2022-05-04 NOTE — Telephone Encounter (Signed)
CSW attempted to contact patient regarding her application for the [employer].  Additional information is required before the application can be submitted.  Left vm.

## 2022-05-05 ENCOUNTER — Inpatient Hospital Stay: Payer: Medicare Other

## 2022-05-05 VITALS — BP 145/64 | HR 78 | Temp 98.9°F | Resp 18

## 2022-05-05 DIAGNOSIS — Z5111 Encounter for antineoplastic chemotherapy: Secondary | ICD-10-CM | POA: Diagnosis not present

## 2022-05-05 DIAGNOSIS — C50011 Malignant neoplasm of nipple and areola, right female breast: Secondary | ICD-10-CM

## 2022-05-05 MED ORDER — PEGFILGRASTIM-PBBK 6 MG/0.6ML ~~LOC~~ SOSY
6.0000 mg | PREFILLED_SYRINGE | Freq: Once | SUBCUTANEOUS | Status: AC
  Administered 2022-05-05: 6 mg via SUBCUTANEOUS
  Filled 2022-05-05: qty 0.6

## 2022-05-24 ENCOUNTER — Inpatient Hospital Stay (HOSPITAL_BASED_OUTPATIENT_CLINIC_OR_DEPARTMENT_OTHER): Payer: Medicare Other | Admitting: Hematology & Oncology

## 2022-05-24 ENCOUNTER — Encounter: Payer: Self-pay | Admitting: *Deleted

## 2022-05-24 ENCOUNTER — Inpatient Hospital Stay: Payer: Medicare Other

## 2022-05-24 ENCOUNTER — Encounter: Payer: Self-pay | Admitting: Hematology & Oncology

## 2022-05-24 VITALS — BP 136/65 | HR 81 | Temp 98.1°F | Resp 20 | Ht 63.25 in | Wt 225.1 lb

## 2022-05-24 DIAGNOSIS — Z79899 Other long term (current) drug therapy: Secondary | ICD-10-CM | POA: Diagnosis not present

## 2022-05-24 DIAGNOSIS — Z170 Estrogen receptor positive status [ER+]: Secondary | ICD-10-CM | POA: Diagnosis not present

## 2022-05-24 DIAGNOSIS — C50011 Malignant neoplasm of nipple and areola, right female breast: Secondary | ICD-10-CM

## 2022-05-24 DIAGNOSIS — C50911 Malignant neoplasm of unspecified site of right female breast: Secondary | ICD-10-CM | POA: Diagnosis not present

## 2022-05-24 DIAGNOSIS — Z5111 Encounter for antineoplastic chemotherapy: Secondary | ICD-10-CM | POA: Diagnosis not present

## 2022-05-24 DIAGNOSIS — Z853 Personal history of malignant neoplasm of breast: Secondary | ICD-10-CM

## 2022-05-24 LAB — CBC WITH DIFFERENTIAL (CANCER CENTER ONLY)
Abs Immature Granulocytes: 0.04 10*3/uL (ref 0.00–0.07)
Basophils Absolute: 0 10*3/uL (ref 0.0–0.1)
Basophils Relative: 0 %
Eosinophils Absolute: 0 10*3/uL (ref 0.0–0.5)
Eosinophils Relative: 0 %
HCT: 35.5 % — ABNORMAL LOW (ref 36.0–46.0)
Hemoglobin: 11.4 g/dL — ABNORMAL LOW (ref 12.0–15.0)
Immature Granulocytes: 1 %
Lymphocytes Relative: 19 %
Lymphs Abs: 1.2 10*3/uL (ref 0.7–4.0)
MCH: 28.1 pg (ref 26.0–34.0)
MCHC: 32.1 g/dL (ref 30.0–36.0)
MCV: 87.4 fL (ref 80.0–100.0)
Monocytes Absolute: 0.5 10*3/uL (ref 0.1–1.0)
Monocytes Relative: 8 %
Neutro Abs: 4.5 10*3/uL (ref 1.7–7.7)
Neutrophils Relative %: 72 %
Platelet Count: 267 10*3/uL (ref 150–400)
RBC: 4.06 MIL/uL (ref 3.87–5.11)
RDW: 14.9 % (ref 11.5–15.5)
WBC Count: 6.1 10*3/uL (ref 4.0–10.5)
nRBC: 0 % (ref 0.0–0.2)

## 2022-05-24 LAB — CMP (CANCER CENTER ONLY)
ALT: 8 U/L (ref 0–44)
AST: 12 U/L — ABNORMAL LOW (ref 15–41)
Albumin: 4.3 g/dL (ref 3.5–5.0)
Alkaline Phosphatase: 88 U/L (ref 38–126)
Anion gap: 9 (ref 5–15)
BUN: 13 mg/dL (ref 6–20)
CO2: 23 mmol/L (ref 22–32)
Calcium: 9.5 mg/dL (ref 8.9–10.3)
Chloride: 106 mmol/L (ref 98–111)
Creatinine: 0.62 mg/dL (ref 0.44–1.00)
GFR, Estimated: >60 mL/min (ref >60)
Glucose, Bld: 155 mg/dL — ABNORMAL HIGH (ref 70–99)
Potassium: 3.8 mmol/L (ref 3.5–5.1)
Sodium: 138 mmol/L (ref 135–145)
Total Bilirubin: 0.4 mg/dL (ref 0.3–1.2)
Total Protein: 6.8 g/dL (ref 6.5–8.1)

## 2022-05-24 LAB — LACTATE DEHYDROGENASE: LDH: 209 U/L — ABNORMAL HIGH (ref 98–192)

## 2022-05-24 MED ORDER — SODIUM CHLORIDE 0.9% FLUSH
10.0000 mL | INTRAVENOUS | Status: DC | PRN
  Administered 2022-05-24: 10 mL

## 2022-05-24 MED ORDER — SODIUM CHLORIDE 0.9 % IV SOLN
10.0000 mg | Freq: Once | INTRAVENOUS | Status: AC
  Administered 2022-05-24: 10 mg via INTRAVENOUS
  Filled 2022-05-24: qty 10

## 2022-05-24 MED ORDER — SODIUM CHLORIDE 0.9 % IV SOLN
75.0000 mg/m2 | Freq: Once | INTRAVENOUS | Status: AC
  Administered 2022-05-24: 160 mg via INTRAVENOUS
  Filled 2022-05-24: qty 16

## 2022-05-24 MED ORDER — SODIUM CHLORIDE 0.9 % IV SOLN
150.0000 mg | Freq: Once | INTRAVENOUS | Status: AC
  Administered 2022-05-24: 150 mg via INTRAVENOUS
  Filled 2022-05-24: qty 150

## 2022-05-24 MED ORDER — HEPARIN SOD (PORK) LOCK FLUSH 100 UNIT/ML IV SOLN
500.0000 [IU] | Freq: Once | INTRAVENOUS | Status: AC | PRN
  Administered 2022-05-24: 500 [IU]

## 2022-05-24 MED ORDER — SODIUM CHLORIDE 0.9 % IV SOLN
800.0000 mg | Freq: Once | INTRAVENOUS | Status: AC
  Administered 2022-05-24: 800 mg via INTRAVENOUS
  Filled 2022-05-24: qty 80

## 2022-05-24 MED ORDER — SODIUM CHLORIDE 0.9% FLUSH
10.0000 mL | Freq: Once | INTRAVENOUS | Status: AC
  Administered 2022-05-24: 10 mL

## 2022-05-24 MED ORDER — PALONOSETRON HCL INJECTION 0.25 MG/5ML
0.2500 mg | Freq: Once | INTRAVENOUS | Status: AC
  Administered 2022-05-24: 0.25 mg via INTRAVENOUS
  Filled 2022-05-24: qty 5

## 2022-05-24 MED ORDER — SODIUM CHLORIDE 0.9 % IV SOLN: Freq: Once | INTRAVENOUS | Status: AC

## 2022-05-24 NOTE — Patient Instructions (Addendum)
[facility] [place]  Discharge Instructions: Thank you for choosing [facility] to provide your oncology and hematology care.   If you have a lab appointment with the [facility], please go directly to the [facility] and check in at the registration area.  Wear comfortable clothing and clothing appropriate for easy access to any Portacath or PICC line.   We strive to give you quality time with your provider. You may need to reschedule your appointment if you arrive late (15 or more minutes).  Arriving late affects you and other patients whose appointments are after yours.  Also, if you miss three or more appointments without notifying the office, you may be dismissed from the clinic at the provider's discretion.      For prescription refill requests, have your pharmacy contact our office and allow 72 hours for refills to be completed.    Today you received the following chemotherapy and/or immunotherapy agents Taxotere/Carboplatin      To help prevent nausea and vomiting after your treatment, we encourage you to take your nausea medication as directed.  BELOW ARE SYMPTOMS THAT SHOULD BE REPORTED IMMEDIATELY: *FEVER GREATER THAN 100.4 F (38 C) OR HIGHER *CHILLS OR SWEATING *NAUSEA AND VOMITING THAT IS NOT CONTROLLED WITH YOUR NAUSEA MEDICATION *UNUSUAL SHORTNESS OF BREATH *UNUSUAL BRUISING OR BLEEDING *URINARY PROBLEMS (pain or burning when urinating, or frequent urination) *BOWEL PROBLEMS (unusual diarrhea, constipation, pain near the anus) TENDERNESS IN MOUTH AND THROAT WITH OR WITHOUT PRESENCE OF ULCERS (sore throat, sores in mouth, or a toothache) UNUSUAL RASH, SWELLING OR PAIN  UNUSUAL VAGINAL DISCHARGE OR ITCHING   Items with * indicate a potential emergency and should be followed up as soon as possible or go to the Emergency Department if any problems should occur.  Please show the CHEMOTHERAPY ALERT CARD or IMMUNOTHERAPY ALERT CARD  at check-in to the Emergency Department and triage nurse. Should you have questions after your visit or need to cancel or reschedule your appointment, please contact [facility]  [phone] and follow the prompts.  Office hours are 8:00 a.m. to 4:30 p.m. Monday - Friday. Please note that voicemails left after 4:00 p.m. may not be returned until the following business day.  We are closed weekends and major holidays. You have access to a nurse at all times for urgent questions. Please call the main number to the clinic [phone] and follow the prompts.  For any non-urgent questions, you may also contact your provider using MyChart. We now offer e-Visits for anyone 9 and older to request care online for non-urgent symptoms. For details visit [email or web address].   Also download the MyChart app! Go to the app store, search "MyChart", open the app, select [facility], and log in with your MyChart username and password.  [facility] [place]  Discharge Instructions: Thank you for choosing [facility] to provide your oncology and hematology care.   If you have a lab appointment with the [facility], please go directly to the [facility] and check in at the registration area.  Wear comfortable clothing and clothing appropriate for easy access to any Portacath or PICC line.   We strive to give you quality time with your provider. You may need to reschedule your appointment if you arrive late (15 or more minutes).  Arriving late affects you and other patients whose appointments are after yours.  Also, if you  miss three or more appointments without notifying the office, you may be dismissed from the clinic at the provider's discretion.      For prescription refill requests, have your pharmacy contact our office and allow 72 hours for refills to be completed.    Today you received the following chemotherapy and/or  immunotherapy agents taxotere, carboplatin      To help prevent nausea and vomiting after your treatment, we encourage you to take your nausea medication as directed.  BELOW ARE SYMPTOMS THAT SHOULD BE REPORTED IMMEDIATELY: *FEVER GREATER THAN 100.4 F (38 C) OR HIGHER *CHILLS OR SWEATING *NAUSEA AND VOMITING THAT IS NOT CONTROLLED WITH YOUR NAUSEA MEDICATION *UNUSUAL SHORTNESS OF BREATH *UNUSUAL BRUISING OR BLEEDING *URINARY PROBLEMS (pain or burning when urinating, or frequent urination) *BOWEL PROBLEMS (unusual diarrhea, constipation, pain near the anus) TENDERNESS IN MOUTH AND THROAT WITH OR WITHOUT PRESENCE OF ULCERS (sore throat, sores in mouth, or a toothache) UNUSUAL RASH, SWELLING OR PAIN  UNUSUAL VAGINAL DISCHARGE OR ITCHING   Items with * indicate a potential emergency and should be followed up as soon as possible or go to the Emergency Department if any problems should occur.  Please show the CHEMOTHERAPY ALERT CARD or IMMUNOTHERAPY ALERT CARD at check-in to the Emergency Department and triage nurse. Should you have questions after your visit or need to cancel or reschedule your appointment, please contact [facility]  [phone] and follow the prompts.  Office hours are 8:00 a.m. to 4:30 p.m. Monday - Friday. Please note that voicemails left after 4:00 p.m. may not be returned until the following business day.  We are closed weekends and major holidays. You have access to a nurse at all times for urgent questions. Please call the main number to the clinic [phone] and follow the prompts.  For any non-urgent questions, you may also contact your provider using MyChart. We now offer e-Visits for anyone 68 and older to request care online for non-urgent symptoms. For details visit [email or web address].   Also download the MyChart app! Go to the app store, search "MyChart", open the app, select [facility], and log in with your MyChart  username and password.

## 2022-05-24 NOTE — Patient Instructions (Signed)

## 2022-05-24 NOTE — Progress Notes (Signed)
Patient will proceed with cycle three.   Oncology Nurse Navigator Documentation     05/24/2022   10:45 AM  Oncology Nurse Navigator Flowsheets  Navigator Follow Up Date: 06/13/2022  Navigator Follow Up Reason: Follow-up Appointment;Chemotherapy  Navigator Location [facility]  Navigator Encounter Type Treatment;Appt/Treatment Plan Review  Patient Visit Type MedOnc  Treatment Phase Active Tx  Barriers/Navigation Needs Coordination of Care;Education  Interventions Psycho-Social Support  Acuity Level 2-Minimal Needs (1-2 Barriers Identified)  Support Groups/Services Friends and Family  Time Spent with Patient 15

## 2022-05-24 NOTE — Progress Notes (Signed)
[facility] Hematology and Oncology Follow Up Visit  CHARMAIN VALIS [id number] Feb 14, 1961 62 y.o. 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 Lybarger is back for follow-up.  So far, I think she is doing well with the chemotherapy.  She has done nicely with the.  She does have some arthralgias.  This might be from the Neulasta that we gave her.  She has had no problem with mouth sores.  There is been no issues with urine or bowels.  She has had no cough or shortness of breath.  There is no nausea or vomiting.  She has had no rashes.  She has had no leg swelling.  She has had no bleeding.  Her good decrease in taste is better.  One of our wonderful nurses suggested that she try using plastic utensils and metal utensils.  When she did this, she said that her taste improved.  She has had no headache.  There is been no visual changes.  Overall, I would say that her performance status is probably ECOG 1.     Medications:  Current Outpatient Medications:    aspirin EC 81 MG tablet, Take 81 mg by mouth daily. Swallow whole., Disp: , Rfl:    dexamethasone (DECADRON) 4 MG tablet, Take 2 tabs by mouth 2 times daily starting day before chemo. Then take 2 tabs daily for 2 days starting day after chemo. Take with food., Disp: 30 tablet, Rfl: 1   ergocalciferol (VITAMIN D2) 1.25 MG (50000 UT) capsule, Take 1 capsule (50,000 Units total) by mouth once a week., Disp: 8 capsule, Rfl: 3   ibuprofen (ADVIL) 200 MG tablet, Take 400 mg by mouth every 6 (six) hours as needed for moderate pain., Disp: , Rfl:    latanoprost (XALATAN) 0.005 % ophthalmic solution, Place 1 drop into  both eyes daily., Disp: , Rfl:    lidocaine-prilocaine (EMLA) cream, Apply 1 Application topically as needed., Disp: 30 g, Rfl: 3   metoprolol tartrate (LOPRESSOR) 100 MG tablet, Take 100 mg by mouth daily., Disp: , Rfl:    nitroGLYCERIN (NITROSTAT) 0.4 MG SL tablet, Place 1 tablet (0.4 mg total) under the tongue every 5 (five) minutes as needed for chest pain. (Patient not taking: Reported on 05/24/2022), Disp: 25 tablet, Rfl: 6   ondansetron (ZOFRAN-ODT) 8 MG disintegrating tablet, Take 1 tablet (8 mg total) by mouth every 8 (eight) hours as needed for nausea or vomiting. (Patient not taking: Reported on 05/24/2022), Disp: 20 tablet, Rfl: 0   prochlorperazine (COMPAZINE) 10 MG tablet, Take 1 tablet (10 mg total) by mouth every 6 (six) hours as needed for nausea or vomiting. (Patient not taking: Reported on 05/24/2022), Disp: 30 tablet, Rfl: 1 No current facility-administered medications for this visit.  Facility-Administered Medications Ordered in Other Visits:    fosaprepitant (EMEND) 150 mg in sodium chloride 0.9 % 145 mL IVPB, 150 mg, Intravenous, Once, Jenaya Saar, Rudell Cobb, MD, Last Rate: 450 mL/hr at 05/24/22 1046, 150 mg at 05/24/22 1046   heparin lock flush 100 unit/mL, 500 Units, Intracatheter, Once PRN, Nashay Brickley, Rudell Cobb, MD   sodium chloride flush (NS) 0.9 % injection 10 mL, 10 mL, Intracatheter, PRN,  Volanda Napoleon, MD  Allergies:  Allergies  Allergen Reactions   Atorvastatin     constipated   Latex Hives    Allergic reaction to condoms years ago.    Past Medical History, Surgical history, Social history, and Family History were reviewed and updated.  Review of Systems: Review of Systems  Constitutional: Negative.   HENT:  Negative.    Eyes: Negative.   Respiratory: Negative.    Cardiovascular: Negative.   Gastrointestinal: Negative.   Endocrine: Negative.   Genitourinary: Negative.    Musculoskeletal: Negative.   Skin: Negative.   Neurological: Negative.   Hematological:  Negative.   Psychiatric/Behavioral: Negative.      Physical Exam:  height is 5' 3.25" (1.607 m) and weight is 225 lb 1.9 oz (102.1 kg). Her oral temperature is 98.1 F (36.7 C). Her blood pressure is 136/65 and her pulse is 81. Her respiration is 20 and oxygen saturation is 100%.   Wt Readings from Last 3 Encounters:  05/24/22 225 lb 1.9 oz (102.1 kg)  05/03/22 223 lb (101.2 kg)  04/05/22 229 lb (103.9 kg)    Physical Exam Vitals reviewed.  Constitutional:      Comments: Chest wall exam shows left chest wall with mastectomy.  She has no chest wall nodules.  There is no left axillary adenopathy.  Right chest wall shows the healing mastectomy.  There is no discharge.  There is no erythema.  There is no warmth or swelling.  There is no right axillary adenopathy.  HENT:     Head: Normocephalic and atraumatic.  Eyes:     Pupils: Pupils are equal, round, and reactive to light.  Cardiovascular:     Rate and Rhythm: Normal rate and regular rhythm.     Heart sounds: Normal heart sounds.  Pulmonary:     Effort: Pulmonary effort is normal.     Breath sounds: Normal breath sounds.  Abdominal:     General: Bowel sounds are normal.     Palpations: Abdomen is soft.  Musculoskeletal:        General: No tenderness or deformity. Normal range of motion.     Cervical back: Normal range of motion.  Lymphadenopathy:     Cervical: No cervical adenopathy.  Skin:    General: Skin is warm and dry.     Findings: No erythema or rash.  Neurological:     Mental Status: She is alert and oriented to person, place, and time.  Psychiatric:        Behavior: Behavior normal.        Thought Content: Thought content normal.        Judgment: Judgment normal.     Lab Results  Component Value Date   WBC 6.1 05/24/2022   HGB 11.4 (L) 05/24/2022   HCT 35.5 (L) 05/24/2022   MCV 87.4 05/24/2022   PLT 267 05/24/2022     Chemistry      Component Value Date/Time   NA 138 05/24/2022 0923   NA 143  02/16/2022 1006   K 3.8 05/24/2022 0923   CL 106 05/24/2022 0923   CO2 23 05/24/2022 0923   BUN 13 05/24/2022 0923   BUN 13 02/16/2022 1006   CREATININE 0.62 05/24/2022 0923      Component Value Date/Time   CALCIUM 9.5 05/24/2022 0923   ALKPHOS 88 05/24/2022 0923   AST 12 (L) 05/24/2022 0923   ALT 8 05/24/2022 0923   BILITOT 0.4 05/24/2022 [id number]  Impression and Plan: Ms. Crest is a very charming 62 year old postmenopausal African-American female.  This is her third breast cancer.  I do not believe this is a recurrence.  I believe this is a new breast cancer.  I am not sure as to why she keeps having breast cancer.  I think she underwent genetic testing.  I think the genetic testing was negative.  We will go ahead with her third cycle of treatment.  I do want to give her 6 cycles of chemotherapy just because of her high Oncotype score.  We will plan to get her back in 3 weeks for her fourth cycle.  After chemotherapy, she will need antiestrogen therapy.  Hopefully, we should be able to use a aromatase inhibitor.   Volanda Napoleon, MD 2/27/202411:04 AM

## 2022-05-26 ENCOUNTER — Inpatient Hospital Stay: Payer: Medicare Other

## 2022-05-26 VITALS — BP 127/77 | HR 88 | Temp 99.4°F | Resp 17

## 2022-05-26 DIAGNOSIS — C50011 Malignant neoplasm of nipple and areola, right female breast: Secondary | ICD-10-CM

## 2022-05-26 DIAGNOSIS — Z5111 Encounter for antineoplastic chemotherapy: Secondary | ICD-10-CM | POA: Diagnosis not present

## 2022-05-26 MED ORDER — PEGFILGRASTIM-PBBK 6 MG/0.6ML ~~LOC~~ SOSY
6.0000 mg | PREFILLED_SYRINGE | Freq: Once | SUBCUTANEOUS | Status: AC
  Administered 2022-05-26: 6 mg via SUBCUTANEOUS
  Filled 2022-05-26: qty 0.6

## 2022-05-26 NOTE — Patient Instructions (Signed)

## 2022-06-01 ENCOUNTER — Inpatient Hospital Stay: Payer: Medicare Other | Attending: Family | Admitting: Licensed Clinical Social Worker

## 2022-06-01 DIAGNOSIS — G629 Polyneuropathy, unspecified: Secondary | ICD-10-CM | POA: Insufficient documentation

## 2022-06-01 DIAGNOSIS — Z79899 Other long term (current) drug therapy: Secondary | ICD-10-CM | POA: Insufficient documentation

## 2022-06-01 DIAGNOSIS — Z5111 Encounter for antineoplastic chemotherapy: Secondary | ICD-10-CM | POA: Insufficient documentation

## 2022-06-01 DIAGNOSIS — C50911 Malignant neoplasm of unspecified site of right female breast: Secondary | ICD-10-CM | POA: Insufficient documentation

## 2022-06-01 DIAGNOSIS — Z170 Estrogen receptor positive status [ER+]: Secondary | ICD-10-CM | POA: Insufficient documentation

## 2022-06-01 NOTE — Progress Notes (Signed)
[facility] CSW Progress Note  Hol[employer] representative received call from patient regarding the [facility].  She received an email from them requesting a letter from patient's medical provider.  CSW to initiate and fax to [facility] for processing.    Rodman Pickle Val Schiavo, LCSW

## 2022-06-13 ENCOUNTER — Inpatient Hospital Stay (HOSPITAL_BASED_OUTPATIENT_CLINIC_OR_DEPARTMENT_OTHER): Payer: Medicare Other | Admitting: Hematology & Oncology

## 2022-06-13 ENCOUNTER — Encounter: Payer: Self-pay | Admitting: Hematology & Oncology

## 2022-06-13 ENCOUNTER — Inpatient Hospital Stay: Payer: Medicare Other

## 2022-06-13 VITALS — BP 145/56 | HR 95 | Resp 17

## 2022-06-13 VITALS — BP 148/74 | HR 89 | Temp 98.9°F | Resp 20 | Ht 63.25 in | Wt 232.0 lb

## 2022-06-13 DIAGNOSIS — G629 Polyneuropathy, unspecified: Secondary | ICD-10-CM

## 2022-06-13 DIAGNOSIS — Z170 Estrogen receptor positive status [ER+]: Secondary | ICD-10-CM | POA: Diagnosis not present

## 2022-06-13 DIAGNOSIS — C50911 Malignant neoplasm of unspecified site of right female breast: Secondary | ICD-10-CM | POA: Diagnosis not present

## 2022-06-13 DIAGNOSIS — C50011 Malignant neoplasm of nipple and areola, right female breast: Secondary | ICD-10-CM

## 2022-06-13 DIAGNOSIS — Z79899 Other long term (current) drug therapy: Secondary | ICD-10-CM | POA: Diagnosis not present

## 2022-06-13 DIAGNOSIS — Z5111 Encounter for antineoplastic chemotherapy: Secondary | ICD-10-CM | POA: Diagnosis not present

## 2022-06-13 LAB — CBC WITH DIFFERENTIAL (CANCER CENTER ONLY)
Abs Immature Granulocytes: 0.07 10*3/uL (ref 0.00–0.07)
Basophils Absolute: 0 10*3/uL (ref 0.0–0.1)
Basophils Relative: 0 %
Eosinophils Absolute: 0 10*3/uL (ref 0.0–0.5)
Eosinophils Relative: 0 %
HCT: 33.2 % — ABNORMAL LOW (ref 36.0–46.0)
Hemoglobin: 11.1 g/dL — ABNORMAL LOW (ref 12.0–15.0)
Immature Granulocytes: 1 %
Lymphocytes Relative: 15 %
Lymphs Abs: 1.3 10*3/uL (ref 0.7–4.0)
MCH: 29.4 pg (ref 26.0–34.0)
MCHC: 33.4 g/dL (ref 30.0–36.0)
MCV: 87.8 fL (ref 80.0–100.0)
Monocytes Absolute: 1.1 10*3/uL — ABNORMAL HIGH (ref 0.1–1.0)
Monocytes Relative: 13 %
Neutro Abs: 5.9 10*3/uL (ref 1.7–7.7)
Neutrophils Relative %: 71 %
Platelet Count: 258 10*3/uL (ref 150–400)
RBC: 3.78 MIL/uL — ABNORMAL LOW (ref 3.87–5.11)
RDW: 16 % — ABNORMAL HIGH (ref 11.5–15.5)
WBC Count: 8.4 10*3/uL (ref 4.0–10.5)
nRBC: 0 % (ref 0.0–0.2)

## 2022-06-13 LAB — CMP (CANCER CENTER ONLY)
ALT: 9 U/L (ref 0–44)
AST: 11 U/L — ABNORMAL LOW (ref 15–41)
Albumin: 4.2 g/dL (ref 3.5–5.0)
Alkaline Phosphatase: 89 U/L (ref 38–126)
Anion gap: 8 (ref 5–15)
BUN: 12 mg/dL (ref 6–20)
CO2: 26 mmol/L (ref 22–32)
Calcium: 9.6 mg/dL (ref 8.9–10.3)
Chloride: 105 mmol/L (ref 98–111)
Creatinine: 0.6 mg/dL (ref 0.44–1.00)
GFR, Estimated: >60 mL/min (ref >60)
Glucose, Bld: 103 mg/dL — ABNORMAL HIGH (ref 70–99)
Potassium: 4 mmol/L (ref 3.5–5.1)
Sodium: 139 mmol/L (ref 135–145)
Total Bilirubin: 0.4 mg/dL (ref 0.3–1.2)
Total Protein: 6.9 g/dL (ref 6.5–8.1)

## 2022-06-13 MED ORDER — SODIUM CHLORIDE 0.9 % IV SOLN
800.0000 mg | Freq: Once | INTRAVENOUS | Status: AC
  Administered 2022-06-13: 800 mg via INTRAVENOUS
  Filled 2022-06-13: qty 80

## 2022-06-13 MED ORDER — SODIUM CHLORIDE 0.9 % IV SOLN: Freq: Once | INTRAVENOUS | Status: AC

## 2022-06-13 MED ORDER — SODIUM CHLORIDE 0.9% FLUSH
10.0000 mL | INTRAVENOUS | Status: DC | PRN
  Administered 2022-06-13: 10 mL

## 2022-06-13 MED ORDER — PALONOSETRON HCL INJECTION 0.25 MG/5ML
0.2500 mg | Freq: Once | INTRAVENOUS | Status: AC
  Administered 2022-06-13: 0.25 mg via INTRAVENOUS
  Filled 2022-06-13: qty 5

## 2022-06-13 MED ORDER — SODIUM CHLORIDE 0.9 % IV SOLN
10.0000 mg | Freq: Once | INTRAVENOUS | Status: AC
  Administered 2022-06-13: 10 mg via INTRAVENOUS
  Filled 2022-06-13: qty 10

## 2022-06-13 MED ORDER — SODIUM CHLORIDE 0.9 % IV SOLN
75.0000 mg/m2 | Freq: Once | INTRAVENOUS | Status: AC
  Administered 2022-06-13: 160 mg via INTRAVENOUS
  Filled 2022-06-13: qty 16

## 2022-06-13 MED ORDER — HEPARIN SOD (PORK) LOCK FLUSH 100 UNIT/ML IV SOLN
500.0000 [IU] | Freq: Once | INTRAVENOUS | Status: AC | PRN
  Administered 2022-06-13: 500 [IU]

## 2022-06-13 MED ORDER — SODIUM CHLORIDE 0.9 % IV SOLN
150.0000 mg | Freq: Once | INTRAVENOUS | Status: AC
  Administered 2022-06-13: 150 mg via INTRAVENOUS
  Filled 2022-06-13: qty 5

## 2022-06-13 NOTE — Progress Notes (Signed)
Carboplatin dose left at 800 mg today per Dr. Antonieta Pert instructions.

## 2022-06-13 NOTE — Patient Instructions (Signed)

## 2022-06-13 NOTE — Patient Instructions (Signed)
[facility] [place]  Discharge Instructions: Thank you for choosing [facility] to provide your oncology and hematology care.   If you have a lab appointment with the [facility], please go directly to the [facility] and check in at the registration area.  Wear comfortable clothing and clothing appropriate for easy access to any Portacath or PICC line.   We strive to give you quality time with your provider. You may need to reschedule your appointment if you arrive late (15 or more minutes).  Arriving late affects you and other patients whose appointments are after yours.  Also, if you miss three or more appointments without notifying the office, you may be dismissed from the clinic at the provider's discretion.      For prescription refill requests, have your pharmacy contact our office and allow 72 hours for refills to be completed.    Today you received the following chemotherapy and/or immunotherapy agents Taxotere/Carboplatin      To help prevent nausea and vomiting after your treatment, we encourage you to take your nausea medication as directed.  BELOW ARE SYMPTOMS THAT SHOULD BE REPORTED IMMEDIATELY: *FEVER GREATER THAN 100.4 F (38 C) OR HIGHER *CHILLS OR SWEATING *NAUSEA AND VOMITING THAT IS NOT CONTROLLED WITH YOUR NAUSEA MEDICATION *UNUSUAL SHORTNESS OF BREATH *UNUSUAL BRUISING OR BLEEDING *URINARY PROBLEMS (pain or burning when urinating, or frequent urination) *BOWEL PROBLEMS (unusual diarrhea, constipation, pain near the anus) TENDERNESS IN MOUTH AND THROAT WITH OR WITHOUT PRESENCE OF ULCERS (sore throat, sores in mouth, or a toothache) UNUSUAL RASH, SWELLING OR PAIN  UNUSUAL VAGINAL DISCHARGE OR ITCHING   Items with * indicate a potential emergency and should be followed up as soon as possible or go to the Emergency Department if any problems should occur.  Please show the CHEMOTHERAPY ALERT CARD or IMMUNOTHERAPY ALERT CARD  at check-in to the Emergency Department and triage nurse. Should you have questions after your visit or need to cancel or reschedule your appointment, please contact [facility]  [phone] and follow the prompts.  Office hours are 8:00 a.m. to 4:30 p.m. Monday - Friday. Please note that voicemails left after 4:00 p.m. may not be returned until the following business day.  We are closed weekends and major holidays. You have access to a nurse at all times for urgent questions. Please call the main number to the clinic [phone] and follow the prompts.  For any non-urgent questions, you may also contact your provider using MyChart. We now offer e-Visits for anyone 9 and older to request care online for non-urgent symptoms. For details visit [email or web address].   Also download the MyChart app! Go to the app store, search "MyChart", open the app, select [facility], and log in with your MyChart username and password.  [facility] [place]  Discharge Instructions: Thank you for choosing [facility] to provide your oncology and hematology care.   If you have a lab appointment with the [facility], please go directly to the [facility] and check in at the registration area.  Wear comfortable clothing and clothing appropriate for easy access to any Portacath or PICC line.   We strive to give you quality time with your provider. You may need to reschedule your appointment if you arrive late (15 or more minutes).  Arriving late affects you and other patients whose appointments are after yours.  Also, if you  miss three or more appointments without notifying the office, you may be dismissed from the clinic at the provider's discretion.      For prescription refill requests, have your pharmacy contact our office and allow 72 hours for refills to be completed.    Today you received the following chemotherapy and/or  immunotherapy agents Taxotere and Carboplatin      To help prevent nausea and vomiting after your treatment, we encourage you to take your nausea medication as directed.  BELOW ARE SYMPTOMS THAT SHOULD BE REPORTED IMMEDIATELY: *FEVER GREATER THAN 100.4 F (38 C) OR HIGHER *CHILLS OR SWEATING *NAUSEA AND VOMITING THAT IS NOT CONTROLLED WITH YOUR NAUSEA MEDICATION *UNUSUAL SHORTNESS OF BREATH *UNUSUAL BRUISING OR BLEEDING *URINARY PROBLEMS (pain or burning when urinating, or frequent urination) *BOWEL PROBLEMS (unusual diarrhea, constipation, pain near the anus) TENDERNESS IN MOUTH AND THROAT WITH OR WITHOUT PRESENCE OF ULCERS (sore throat, sores in mouth, or a toothache) UNUSUAL RASH, SWELLING OR PAIN  UNUSUAL VAGINAL DISCHARGE OR ITCHING   Items with * indicate a potential emergency and should be followed up as soon as possible or go to the Emergency Department if any problems should occur.  Please show the CHEMOTHERAPY ALERT CARD or IMMUNOTHERAPY ALERT CARD at check-in to the Emergency Department and triage nurse. Should you have questions after your visit or need to cancel or reschedule your appointment, please contact [facility]  [phone] and follow the prompts.  Office hours are 8:00 a.m. to 4:30 p.m. Monday - Friday. Please note that voicemails left after 4:00 p.m. may not be returned until the following business day.  We are closed weekends and major holidays. You have access to a nurse at all times for urgent questions. Please call the main number to the clinic [phone] and follow the prompts.  For any non-urgent questions, you may also contact your provider using MyChart. We now offer e-Visits for anyone 35 and older to request care online for non-urgent symptoms. For details visit [email or web address].   Also download the MyChart app! Go to the app store, search "MyChart", open the app, select [facility], and log in with your MyChart  username and password.

## 2022-06-13 NOTE — Progress Notes (Signed)
[facility] Follow Up Visit  Jaime [name] [id number] 06-08-1960 62 y.o. 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  Everything is going okay so far.  I think she has tolerated chemotherapy pretty well.  She did have little bit of thrush with the last treatment.  Patient a little bit of neuropathy in the toes and fingers.  I told her to try some vitamin B6.  She has had no nausea or vomiting.  There is been no issues with bowels or bladder.  She has had no diarrhea.  There has been no bleeding.  She has had no rashes.  There has been no headache.  I would have said that her performance status is probably ECOG 1.      Medications:  Current Outpatient Medications:    aspirin EC 81 MG tablet, Take 81 mg by mouth daily. Swallow whole., Disp: , Rfl:    dexamethasone (DECADRON) 4 MG tablet, Take 2 tabs by mouth 2 times daily starting day before chemo. Then take 2 tabs daily for 2 days starting day after chemo. Take with food., Disp: 30 tablet, Rfl: 1   ergocalciferol (VITAMIN D2) 1.25 MG (50000 UT) capsule, Take 1 capsule (50,000 Units total) by mouth once a week., Disp: 8 capsule, Rfl: 3   ibuprofen (ADVIL) 200 MG tablet, Take 400 mg by mouth every 6 (six) hours as needed for moderate pain., Disp: , Rfl:    latanoprost (XALATAN) 0.005 % ophthalmic solution, Place 1 drop into both eyes daily., Disp: , Rfl:    lidocaine-prilocaine (EMLA) cream, Apply 1 Application topically as needed. (Patient not taking: Reported on 06/13/2022), Disp: 30 g, Rfl: 3   metoprolol tartrate (LOPRESSOR) 100  MG tablet, Take 100 mg by mouth daily. (Patient not taking: Reported on 06/13/2022), Disp: , Rfl:    nitroGLYCERIN (NITROSTAT) 0.4 MG SL tablet, Place 1 tablet (0.4 mg total) under the tongue every 5 (five) minutes as needed for chest pain. (Patient not taking: Reported on 05/24/2022), Disp: 25 tablet, Rfl: 6   ondansetron (ZOFRAN-ODT) 8 MG disintegrating tablet, Take 1 tablet (8 mg total) by mouth every 8 (eight) hours as needed for nausea or vomiting. (Patient not taking: Reported on 05/24/2022), Disp: 20 tablet, Rfl: 0   prochlorperazine (COMPAZINE) 10 MG tablet, Take 1 tablet (10 mg total) by mouth every 6 (six) hours as needed for nausea or vomiting. (Patient not taking: Reported on 05/24/2022), Disp: 30 tablet, Rfl: 1  Allergies:  Allergies  Allergen Reactions   Atorvastatin     constipated   Latex Hives    Allergic reaction to condoms years ago.    Past Medical History, Surgical history, Social history, and Family History were reviewed and updated.  Review of Systems: Review of Systems  Constitutional: Negative.   HENT:  Negative.    Eyes: Negative.   Respiratory: Negative.    Cardiovascular: Negative.   Gastrointestinal: Negative.   Endocrine: Negative.   Genitourinary: Negative.    Musculoskeletal: Negative.   Skin: Negative.   Neurological: Negative.   Hematological: Negative.   Psychiatric/Behavioral: Negative.  Physical Exam:  height is 5' 3.25" (1.607 m) and weight is 232 lb (105.2 kg). Her oral temperature is 98.9 F (37.2 C). Her blood pressure is 148/74 (abnormal) and her pulse is 89. Her respiration is 20 and oxygen saturation is 100%.   Wt Readings from Last 3 Encounters:  06/13/22 232 lb (105.2 kg)  05/24/22 225 lb 1.9 oz (102.1 kg)  05/03/22 223 lb (101.2 kg)    Physical Exam Vitals reviewed.  Constitutional:      Comments: Chest wall exam shows left chest wall with mastectomy.  She has no chest wall nodules.  There is no left axillary adenopathy.   Right chest wall shows the healing mastectomy.  There is no discharge.  There is no erythema.  There is no warmth or swelling.  There is no right axillary adenopathy.  HENT:     Head: Normocephalic and atraumatic.  Eyes:     Pupils: Pupils are equal, round, and reactive to light.  Cardiovascular:     Rate and Rhythm: Normal rate and regular rhythm.     Heart sounds: Normal heart sounds.  Pulmonary:     Effort: Pulmonary effort is normal.     Breath sounds: Normal breath sounds.  Abdominal:     General: Bowel sounds are normal.     Palpations: Abdomen is soft.  Musculoskeletal:        General: No tenderness or deformity. Normal range of motion.     Cervical back: Normal range of motion.  Lymphadenopathy:     Cervical: No cervical adenopathy.  Skin:    General: Skin is warm and dry.     Findings: No erythema or rash.  Neurological:     Mental Status: She is alert and oriented to person, place, and time.  Psychiatric:        Behavior: Behavior normal.        Thought Content: Thought content normal.        Judgment: Judgment normal.     Lab Results  Component Value Date   WBC 8.4 06/13/2022   HGB 11.1 (L) 06/13/2022   HCT 33.2 (L) 06/13/2022   MCV 87.8 06/13/2022   PLT 258 06/13/2022     Chemistry      Component Value Date/Time   NA 139 06/13/2022 0930   NA 143 02/16/2022 1006   K 4.0 06/13/2022 0930   CL 105 06/13/2022 0930   CO2 26 06/13/2022 0930   BUN 12 06/13/2022 0930   BUN 13 02/16/2022 1006   CREATININE 0.60 06/13/2022 0930      Component Value Date/Time   CALCIUM 9.6 06/13/2022 0930   ALKPHOS 89 06/13/2022 0930   AST 11 (L) 06/13/2022 0930   ALT 9 06/13/2022 0930   BILITOT 0.4 06/13/2022 0930      Impression and Plan: Jaime [name] is a very charming 62 year old postmenopausal African-American female.  This is her third breast cancer.  I do not believe this is a recurrence.  I believe this is a new breast cancer.  I am not sure as to why she keeps  having breast cancer.  I think she underwent genetic testing.  I think the genetic testing was negative.  We will go ahead with her fourth cycle of treatment.  I will still plan to give her 6 cycles of treatment.  I really believe that she is at a significant risk for recurrence because of her history.  Once we get done with chemotherapy, we will then plan  to get her on antiestrogen therapy with Femara.  I will plan to get her back in 3 weeks.    Volanda Napoleon, MD 3/18/202410:35 AM

## 2022-06-14 ENCOUNTER — Encounter: Payer: Self-pay | Admitting: *Deleted

## 2022-06-14 LAB — FOLLICLE STIMULATING HORMONE: FSH: 61.8 m[IU]/mL (ref 25.8–134.8)

## 2022-06-14 LAB — LUTEINIZING HORMONE: LH: 46.2 m[IU]/mL (ref 7.7–58.5)

## 2022-06-14 NOTE — Progress Notes (Signed)
Patient received cycle four. Will continue on current treatment.   Oncology Nurse Navigator Documentation     06/14/2022    1:30 PM  Oncology Nurse Navigator Flowsheets  Navigator Follow Up Date: 07/05/2022  Navigator Follow Up Reason: Follow-up Appointment;Chemotherapy  Navigator Location [facility]  Navigator Encounter Type Appt/Treatment Plan Review  Patient Visit Type MedOnc  Treatment Phase Active Tx  Barriers/Navigation Needs Coordination of Care;Education  Interventions None Required  Acuity Level 2-Minimal Needs (1-2 Barriers Identified)  Support Groups/Services Friends and Family  Time Spent with Patient 15

## 2022-06-15 ENCOUNTER — Inpatient Hospital Stay: Payer: Medicare Other

## 2022-06-15 VITALS — BP 156/67 | HR 86 | Temp 98.2°F | Resp 18

## 2022-06-15 DIAGNOSIS — C50011 Malignant neoplasm of nipple and areola, right female breast: Secondary | ICD-10-CM

## 2022-06-15 DIAGNOSIS — Z5111 Encounter for antineoplastic chemotherapy: Secondary | ICD-10-CM | POA: Diagnosis not present

## 2022-06-15 MED ORDER — PEGFILGRASTIM-PBBK 6 MG/0.6ML ~~LOC~~ SOSY
6.0000 mg | PREFILLED_SYRINGE | Freq: Once | SUBCUTANEOUS | Status: AC
  Administered 2022-06-15: 6 mg via SUBCUTANEOUS
  Filled 2022-06-15: qty 0.6

## 2022-06-15 NOTE — Patient Instructions (Signed)

## 2022-06-16 LAB — ESTRADIOL, ULTRA SENS: Estradiol, Sensitive: 6.2 pg/mL

## 2022-07-04 ENCOUNTER — Other Ambulatory Visit: Payer: Self-pay

## 2022-07-04 DIAGNOSIS — C50011 Malignant neoplasm of nipple and areola, right female breast: Secondary | ICD-10-CM

## 2022-07-04 DIAGNOSIS — E559 Vitamin D deficiency, unspecified: Secondary | ICD-10-CM

## 2022-07-05 ENCOUNTER — Inpatient Hospital Stay: Payer: Medicare Other

## 2022-07-05 ENCOUNTER — Encounter: Payer: Self-pay | Admitting: *Deleted

## 2022-07-05 ENCOUNTER — Encounter: Payer: Self-pay | Admitting: Hematology & Oncology

## 2022-07-05 ENCOUNTER — Inpatient Hospital Stay (HOSPITAL_BASED_OUTPATIENT_CLINIC_OR_DEPARTMENT_OTHER): Payer: Medicare Other | Admitting: Hematology & Oncology

## 2022-07-05 ENCOUNTER — Inpatient Hospital Stay: Payer: Medicare Other | Attending: Family

## 2022-07-05 VITALS — BP 135/65 | HR 87 | Temp 98.5°F | Resp 20 | Ht 63.25 in | Wt 232.8 lb

## 2022-07-05 VITALS — BP 149/68 | HR 85 | Resp 17

## 2022-07-05 DIAGNOSIS — E559 Vitamin D deficiency, unspecified: Secondary | ICD-10-CM

## 2022-07-05 DIAGNOSIS — C50911 Malignant neoplasm of unspecified site of right female breast: Secondary | ICD-10-CM | POA: Diagnosis present

## 2022-07-05 DIAGNOSIS — C50011 Malignant neoplasm of nipple and areola, right female breast: Secondary | ICD-10-CM | POA: Diagnosis not present

## 2022-07-05 DIAGNOSIS — Z170 Estrogen receptor positive status [ER+]: Secondary | ICD-10-CM | POA: Insufficient documentation

## 2022-07-05 DIAGNOSIS — Z853 Personal history of malignant neoplasm of breast: Secondary | ICD-10-CM

## 2022-07-05 DIAGNOSIS — Z79899 Other long term (current) drug therapy: Secondary | ICD-10-CM | POA: Diagnosis not present

## 2022-07-05 DIAGNOSIS — Z5111 Encounter for antineoplastic chemotherapy: Secondary | ICD-10-CM | POA: Insufficient documentation

## 2022-07-05 DIAGNOSIS — Z7983 Long term (current) use of bisphosphonates: Secondary | ICD-10-CM | POA: Insufficient documentation

## 2022-07-05 LAB — CMP (CANCER CENTER ONLY)
ALT: 9 U/L (ref 0–44)
AST: 12 U/L — ABNORMAL LOW (ref 15–41)
Albumin: 4 g/dL (ref 3.5–5.0)
Alkaline Phosphatase: 74 U/L (ref 38–126)
Anion gap: 8 (ref 5–15)
BUN: 11 mg/dL (ref 8–23)
CO2: 25 mmol/L (ref 22–32)
Calcium: 9.2 mg/dL (ref 8.9–10.3)
Chloride: 105 mmol/L (ref 98–111)
Creatinine: 0.53 mg/dL (ref 0.44–1.00)
GFR, Estimated: >60 mL/min (ref >60)
Glucose, Bld: 107 mg/dL — ABNORMAL HIGH (ref 70–99)
Potassium: 4.2 mmol/L (ref 3.5–5.1)
Sodium: 138 mmol/L (ref 135–145)
Total Bilirubin: 0.4 mg/dL (ref 0.3–1.2)
Total Protein: 6.6 g/dL (ref 6.5–8.1)

## 2022-07-05 LAB — CBC WITH DIFFERENTIAL (CANCER CENTER ONLY)
Abs Immature Granulocytes: 0.03 10*3/uL (ref 0.00–0.07)
Basophils Absolute: 0 10*3/uL (ref 0.0–0.1)
Basophils Relative: 0 %
Eosinophils Absolute: 0 10*3/uL (ref 0.0–0.5)
Eosinophils Relative: 0 %
HCT: 34 % — ABNORMAL LOW (ref 36.0–46.0)
Hemoglobin: 11.2 g/dL — ABNORMAL LOW (ref 12.0–15.0)
Immature Granulocytes: 0 %
Lymphocytes Relative: 18 %
Lymphs Abs: 1.3 10*3/uL (ref 0.7–4.0)
MCH: 29.9 pg (ref 26.0–34.0)
MCHC: 32.9 g/dL (ref 30.0–36.0)
MCV: 90.7 fL (ref 80.0–100.0)
Monocytes Absolute: 0.9 10*3/uL (ref 0.1–1.0)
Monocytes Relative: 12 %
Neutro Abs: 5.2 10*3/uL (ref 1.7–7.7)
Neutrophils Relative %: 70 %
Platelet Count: 235 10*3/uL (ref 150–400)
RBC: 3.75 MIL/uL — ABNORMAL LOW (ref 3.87–5.11)
RDW: 17.5 % — ABNORMAL HIGH (ref 11.5–15.5)
WBC Count: 7.4 10*3/uL (ref 4.0–10.5)
nRBC: 0 % (ref 0.0–0.2)

## 2022-07-05 LAB — VITAMIN D 25 HYDROXY (VIT D DEFICIENCY, FRACTURES): Vit D, 25-Hydroxy: 37.67 ng/mL (ref 30–100)

## 2022-07-05 LAB — LACTATE DEHYDROGENASE: LDH: 239 U/L — ABNORMAL HIGH (ref 98–192)

## 2022-07-05 MED ORDER — SODIUM CHLORIDE 0.9 % IV SOLN
75.0000 mg/m2 | Freq: Once | INTRAVENOUS | Status: AC
  Administered 2022-07-05: 160 mg via INTRAVENOUS
  Filled 2022-07-05: qty 16

## 2022-07-05 MED ORDER — SODIUM CHLORIDE 0.9 % IV SOLN
10.0000 mg | Freq: Once | INTRAVENOUS | Status: AC
  Administered 2022-07-05: 10 mg via INTRAVENOUS
  Filled 2022-07-05: qty 10

## 2022-07-05 MED ORDER — SODIUM CHLORIDE 0.9 % IV SOLN: Freq: Once | INTRAVENOUS | Status: AC

## 2022-07-05 MED ORDER — PALONOSETRON HCL INJECTION 0.25 MG/5ML
0.2500 mg | Freq: Once | INTRAVENOUS | Status: AC
  Administered 2022-07-05: 0.25 mg via INTRAVENOUS
  Filled 2022-07-05: qty 5

## 2022-07-05 MED ORDER — SODIUM CHLORIDE 0.9 % IV SOLN
150.0000 mg | Freq: Once | INTRAVENOUS | Status: AC
  Administered 2022-07-05: 150 mg via INTRAVENOUS
  Filled 2022-07-05: qty 150

## 2022-07-05 MED ORDER — HEPARIN SOD (PORK) LOCK FLUSH 100 UNIT/ML IV SOLN
500.0000 [IU] | Freq: Once | INTRAVENOUS | Status: AC | PRN
  Administered 2022-07-05: 500 [IU]

## 2022-07-05 MED ORDER — SODIUM CHLORIDE 0.9% FLUSH
10.0000 mL | INTRAVENOUS | Status: DC | PRN
  Administered 2022-07-05: 10 mL

## 2022-07-05 MED ORDER — SODIUM CHLORIDE 0.9 % IV SOLN
800.0000 mg | Freq: Once | INTRAVENOUS | Status: AC
  Administered 2022-07-05: 800 mg via INTRAVENOUS
  Filled 2022-07-05: qty 80

## 2022-07-05 NOTE — Progress Notes (Unsigned)
Due to side effects, patient will complete treatment today after her fifth cycle. When she returns, will discuss starting on AI.   Oncology Nurse Navigator Documentation     07/05/2022   10:15 AM  Oncology Nurse Navigator Flowsheets  Phase of Treatment Chemo  Chemotherapy Actual End Date: 07/05/2022  Navigator Follow Up Date: 08/03/2022  Navigator Follow Up Reason: Follow-up Appointment  Navigator Location [facility]  Navigator Encounter Type Appt/Treatment Plan Review  Patient Visit Type MedOnc  Treatment Phase Final Chemo TX  Barriers/Navigation Needs No Barriers At This Time  Interventions None Required  Acuity Level 1-No Barriers  Support Groups/Services Friends and Family  Time Spent with Patient 15

## 2022-07-05 NOTE — Progress Notes (Signed)
[name]  Jaime [name] [id number] 30-Sep-1960 62 y.o. 07/05/2022   Principle Diagnosis:  Stage IIa (T2N0M0) infiltrating ductal carcinoma of the right breast- ER+/PR-/HER2- --  Oncotype = 52  Current Therapy:   Status post right modified radical mastectomy on 03/08/2022 Taxotere/carboplatinum -s/p cycle 4/5 to start on 04/14/2022 Neulasta 6 mg subcu post chemotherapy Zometa 4 mg IV every 6 months --next dose in 10/2022     Interim History:  Ms. Jaime RandCunningham is back for follow-up.  She is developing little bit more neuropathy in her fingertips and toes.  I am sure this is from the chemotherapy.  Otherwise, she seems to be doing pretty well.  She is having no problems with nausea or vomiting.  There is no mouth sores.  There is no change in bowel or bladder habits.  She has had no rashes.  There is been no bleeding.  When we last saw her, her estradiol level was 6.2.  She has had no fever.  She has avoided COVID.  Overall, I was said that her performance status is probably ECOG 1.      Medications:  Current Outpatient Medications:    aspirin EC 81 MG tablet, Take 81 mg by mouth daily. Swallow whole., Disp: , Rfl:    dexamethasone (DECADRON) 4 MG tablet, Take 2 tabs by mouth 2 times daily starting day before chemo. Then take 2 tabs daily for 2 days starting day after chemo. Take with food., Disp: 30 tablet, Rfl: 1   ergocalciferol (VITAMIN D2) 1.25 MG (50000 UT) capsule, Take 1 capsule (50,000 Units total) by mouth once a week., Disp: 8 capsule, Rfl: 3   ibuprofen (ADVIL) 200 MG tablet, Take 400 mg by mouth every 6 (six) hours as needed for moderate pain., Disp: , Rfl:    latanoprost (XALATAN) 0.005 % ophthalmic solution, Place 1 drop into both eyes daily., Disp: , Rfl:    lidocaine-prilocaine (EMLA) cream, Apply 1 Application topically as needed. (Patient not taking: Reported on 06/15/2022), Disp: 30 g, Rfl: 3   metoprolol tartrate  (LOPRESSOR) 100 MG tablet, Take 100 mg by mouth daily. (Patient not taking: Reported on 06/13/2022), Disp: , Rfl:    nitroGLYCERIN (NITROSTAT) 0.4 MG SL tablet, Place 1 tablet (0.4 mg total) under the tongue every 5 (five) minutes as needed for chest pain. (Patient not taking: Reported on 05/24/2022), Disp: 25 tablet, Rfl: 6   ondansetron (ZOFRAN-ODT) 8 MG disintegrating tablet, Take 1 tablet (8 mg total) by mouth every 8 (eight) hours as needed for nausea or vomiting. (Patient not taking: Reported on 05/24/2022), Disp: 20 tablet, Rfl: 0   prochlorperazine (COMPAZINE) 10 MG tablet, Take 1 tablet (10 mg total) by mouth every 6 (six) hours as needed for nausea or vomiting. (Patient not taking: Reported on 05/24/2022), Disp: 30 tablet, Rfl: 1  Allergies:  Allergies  Allergen Reactions   Atorvastatin     constipated   Latex Hives    Allergic reaction to condoms years ago.    Past Medical History, Surgical history, Social history, and Family History were reviewed and updated.  Review of Systems: Review of Systems  Constitutional: Negative.   HENT:  Negative.    Eyes: Negative.   Respiratory: Negative.    Cardiovascular: Negative.   Gastrointestinal: Negative.   Endocrine: Negative.   Genitourinary: Negative.    Musculoskeletal: Negative.   Skin: Negative.   Neurological: Negative.   Hematological: Negative.   Psychiatric/Behavioral: Negative.  Physical Exam:  height is 5' 3.25" (1.607 m) and weight is 232 lb 12.8 oz (105.6 kg). Her oral temperature is 98.5 F (36.9 C). Her blood pressure is 135/65 and her pulse is 87. Her respiration is 20 and oxygen saturation is 100%.   Wt Readings from Last 3 Encounters:  07/05/22 232 lb 12.8 oz (105.6 kg)  06/13/22 232 lb (105.2 kg)  05/24/22 225 lb 1.9 oz (102.1 kg)    Physical Exam Vitals reviewed.  Constitutional:      Comments: Chest wall exam shows left chest wall with mastectomy.  She has no chest wall nodules.  There is no left  axillary adenopathy.  Right chest wall shows the healing mastectomy.  There is no discharge.  There is no erythema.  There is no warmth or swelling.  There is no right axillary adenopathy.  HENT:     Head: Normocephalic and atraumatic.  Eyes:     Pupils: Pupils are equal, round, and reactive to light.  Cardiovascular:     Rate and Rhythm: Normal rate and regular rhythm.     Heart sounds: Normal heart sounds.  Pulmonary:     Effort: Pulmonary effort is normal.     Breath sounds: Normal breath sounds.  Abdominal:     General: Bowel sounds are normal.     Palpations: Abdomen is soft.  Musculoskeletal:        General: No tenderness or deformity. Normal range of motion.     Cervical back: Normal range of motion.  Lymphadenopathy:     Cervical: No cervical adenopathy.  Skin:    General: Skin is warm and dry.     Findings: No erythema or rash.  Neurological:     Mental Status: She is alert and oriented to person, place, and time.  Psychiatric:        Behavior: Behavior normal.        Thought Content: Thought content normal.        Judgment: Judgment normal.      Lab Results  Component Value Date   WBC 7.4 07/05/2022   HGB 11.2 (L) 07/05/2022   HCT 34.0 (L) 07/05/2022   MCV 90.7 07/05/2022   PLT 235 07/05/2022     Chemistry      Component Value Date/Time   NA 138 07/05/2022 0954   NA 143 02/16/2022 1006   K 4.2 07/05/2022 0954   CL 105 07/05/2022 0954   CO2 25 07/05/2022 0954   BUN 11 07/05/2022 0954   BUN 13 02/16/2022 1006   CREATININE 0.53 07/05/2022 0954      Component Value Date/Time   CALCIUM 9.2 07/05/2022 0954   ALKPHOS 74 07/05/2022 0954   AST 12 (L) 07/05/2022 0954   ALT 9 07/05/2022 0954   BILITOT 0.4 07/05/2022 0954      Impression and Plan: Ms. Jaime [name] is a very charming 62 year old postmenopausal African-American female.  This is her third breast cancer.  I do not believe this is a recurrence.  I believe this is a new breast cancer.  I am not  sure as to why she keeps having breast cancer.  I think she underwent genetic testing.  I think the genetic testing was negative.  This will be her fifth and final cycle of treatment.  I think cycles should be adequate.  She did not have any positive lymph nodes.  She is on developed toxicity and I would like to avoid her having permanent toxicity.  We will plan  to get her back in about a month or so now.  Will get her back, we will then see get her on an aromatase inhibitor.  I do not think that we need a CDK4/CDK6 inhibitor.      Josph Macho, MD 4/9/202410:33 AM

## 2022-07-05 NOTE — Patient Instructions (Signed)
[facility]  Discharge Instructions: Thank you for choosing [facility] to provide your oncology and hematology care.   If you have a lab appointment with the Cancer Center, please go directly to the Cancer Center and check in at the registration area.  Wear comfortable clothing and clothing appropriate for easy access to any Portacath or PICC line.   We strive to give you quality time with your provider. You may need to reschedule your appointment if you arrive late (15 or more minutes).  Arriving late affects you and other patients whose appointments are after yours.  Also, if you miss three or more appointments without notifying the office, you may be dismissed from the clinic at the provider's discretion.      For prescription refill requests, have your pharmacy contact our office and allow 72 hours for refills to be completed.    Today you received the following chemotherapy and/or immunotherapy agents Taxotere/Carboplatin      To help prevent nausea and vomiting after your treatment, we encourage you to take your nausea medication as directed.  BELOW ARE SYMPTOMS THAT SHOULD BE REPORTED IMMEDIATELY: *FEVER GREATER THAN 100.4 F (38 C) OR HIGHER *CHILLS OR SWEATING *NAUSEA AND VOMITING THAT IS NOT CONTROLLED WITH YOUR NAUSEA MEDICATION *UNUSUAL SHORTNESS OF BREATH *UNUSUAL BRUISING OR BLEEDING *URINARY PROBLEMS (pain or burning when urinating, or frequent urination) *BOWEL PROBLEMS (unusual diarrhea, constipation, pain near the anus) TENDERNESS IN MOUTH AND THROAT WITH OR WITHOUT PRESENCE OF ULCERS (sore throat, sores in mouth, or a toothache) UNUSUAL RASH, SWELLING OR PAIN  UNUSUAL VAGINAL DISCHARGE OR ITCHING   Items with * indicate a potential emergency and should be followed up as soon as possible or go to the Emergency Department if any problems should occur.  Please show the CHEMOTHERAPY ALERT CARD or IMMUNOTHERAPY ALERT CARD  at check-in to the Emergency Department and triage nurse. Should you have questions after your visit or need to cancel or reschedule your appointment, please contact [facility] [place]  [phone] and follow the prompts.  Office hours are 8:00 a.m. to 4:30 p.m. Monday - Friday. Please note that voicemails left after 4:00 p.m. may not be returned until the following business day.  We are closed weekends and major holidays. You have access to a nurse at all times for urgent questions. Please call the main number to the clinic [phone] and follow the prompts.  For any non-urgent questions, you may also contact your provider using MyChart. We now offer e-Visits for anyone 49 and older to request care online for non-urgent symptoms. For details visit [email or web address].   Also download the MyChart app! Go to the app store, search "MyChart", open the app, select [facility], and log in with your MyChart username and password.

## 2022-07-05 NOTE — Patient Instructions (Signed)

## 2022-07-06 ENCOUNTER — Encounter: Payer: Self-pay | Admitting: Family

## 2022-07-06 ENCOUNTER — Encounter: Payer: Self-pay | Admitting: Hematology & Oncology

## 2022-07-07 ENCOUNTER — Telehealth: Payer: Self-pay | Admitting: Family

## 2022-07-07 ENCOUNTER — Inpatient Hospital Stay: Payer: Medicare Other

## 2022-07-07 VITALS — BP 135/59 | HR 98 | Temp 98.4°F | Resp 18

## 2022-07-07 DIAGNOSIS — Z5111 Encounter for antineoplastic chemotherapy: Secondary | ICD-10-CM | POA: Diagnosis not present

## 2022-07-07 DIAGNOSIS — C50011 Malignant neoplasm of nipple and areola, right female breast: Secondary | ICD-10-CM

## 2022-07-07 MED ORDER — PEGFILGRASTIM-PBBK 6 MG/0.6ML ~~LOC~~ SOSY
6.0000 mg | PREFILLED_SYRINGE | Freq: Once | SUBCUTANEOUS | Status: AC
  Administered 2022-07-07: 6 mg via SUBCUTANEOUS
  Filled 2022-07-07: qty 0.6

## 2022-07-07 NOTE — Telephone Encounter (Signed)
Contacted [name] to schedule their annual wellness visit. Appointment made for 07/13/2022.  Verlee Rossetti; Care Guide Ambulatory Clinical Support [place] [facility] Direct Dial: [phone]

## 2022-07-07 NOTE — Patient Instructions (Signed)

## 2022-07-13 ENCOUNTER — Ambulatory Visit (INDEPENDENT_AMBULATORY_CARE_PROVIDER_SITE_OTHER): Payer: Medicare Other | Admitting: *Deleted

## 2022-07-13 VITALS — Ht 63.25 in | Wt 230.0 lb

## 2022-07-13 DIAGNOSIS — Z0000 Encounter for general adult medical examination without abnormal findings: Secondary | ICD-10-CM | POA: Diagnosis not present

## 2022-07-13 NOTE — Progress Notes (Signed)
Subjective:  Pt completed ADLs, Fall risk, and SDOH during e-check in on 07/07/22.  Answers verified with pt.    Jaime [name] is a 62 y.o. female who presents for Medicare Annual (Subsequent) preventive examination.  I connected with  Jaime [name] on 07/13/22 by a audio enabled telemedicine application and verified that I am speaking with the correct person using two identifiers.  Patient Location: Home  Provider Location: Office/Clinic  I discussed the limitations of evaluation and management by telemedicine. The patient expressed understanding and agreed to proceed.   Review of Systems     Cardiac Risk Factors include: dyslipidemia;obesity (BMI >30kg/m2)     Objective:    Today's Vitals   07/13/22 0955  Weight: 230 lb (104.3 kg)  Height: 5' 3.25" (1.607 m)   Body mass index is 40.42 kg/m.     07/13/2022    9:46 AM 07/07/2022   12:22 PM 07/05/2022   10:22 AM 06/15/2022   11:39 AM 06/13/2022    9:53 AM 05/24/2022   10:12 AM 05/05/2022   11:03 AM  Advanced Directives  Does Patient Have a Medical Advance Directive? No No No No No No No  Would patient like information on creating a medical advance directive? No - Patient declined No - Patient declined No - Patient declined No - Patient declined No - Patient declined No - Patient declined No - Patient declined    Current Medications (verified) Outpatient Encounter Medications as of 07/13/2022  Medication Sig   aspirin EC 81 MG tablet Take 81 mg by mouth daily. Swallow whole.   ergocalciferol (VITAMIN D2) 1.25 MG (50000 UT) capsule Take 1 capsule (50,000 Units total) by mouth once a week.   ibuprofen (ADVIL) 200 MG tablet Take 400 mg by mouth every 6 (six) hours as needed for moderate pain.   latanoprost (XALATAN) 0.005 % ophthalmic solution Place 1 drop into both eyes daily.   nitroGLYCERIN (NITROSTAT) 0.4 MG SL tablet Place 1 tablet (0.4 mg total) under the tongue every 5 (five) minutes as needed for chest  pain. (Patient not taking: Reported on 05/24/2022)   [DISCONTINUED] dexamethasone (DECADRON) 4 MG tablet Take 2 tabs by mouth 2 times daily starting day before chemo. Then take 2 tabs daily for 2 days starting day after chemo. Take with food.   [DISCONTINUED] lidocaine-prilocaine (EMLA) cream Apply 1 Application topically as needed. (Patient not taking: Reported on 06/15/2022)   [DISCONTINUED] metoprolol tartrate (LOPRESSOR) 100 MG tablet Take 100 mg by mouth daily. (Patient not taking: Reported on 06/13/2022)   [DISCONTINUED] ondansetron (ZOFRAN-ODT) 8 MG disintegrating tablet Take 1 tablet (8 mg total) by mouth every 8 (eight) hours as needed for nausea or vomiting. (Patient not taking: Reported on 05/24/2022)   [DISCONTINUED] prochlorperazine (COMPAZINE) 10 MG tablet Take 1 tablet (10 mg total) by mouth every 6 (six) hours as needed for nausea or vomiting. (Patient not taking: Reported on 05/24/2022)   No facility-administered encounter medications on file as of 07/13/2022.    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   Hx of breast cancer 02/13/2019   Right (2004) pT1cN1 cM0 triple negative s/p lumpectomy and ALND with adjuvant ddAc-T and radiation Left (2018) - Stage IIA cT2N0M0 ER-/PR-/Her2+ and Stage IA CT1CN0 ER-/PR-/Her2- both ypT1aN0(sn) grade 3 - s/p 6C neoadjuvant chemo, and mastectomy and SLNB on 09/15/2017 (records scanned)   Lung mass 02/13/2019   Open-angle glaucoma 02/13/2019   Personal history of chemotherapy    Personal history of  radiation therapy    Personal history of tobacco use, presenting hazards to health 03/12/2019   Right thyroid nodule    S/P left mastectomy 02/13/2019   Sleep apnea    mild per pt, no CPAP recommended   Tobacco use 02/13/2019   Past Surgical History:  Procedure Laterality Date   BREAST BIOPSY Left 2019   BREAST BIOPSY Right 01/31/2022   MM RT BREAST BX W LOC DEV 1ST LESION IMAGE BX SPEC STEREO GUIDE 01/31/2022 [facility]   BREAST LUMPECTOMY Right 2004   triple Neg breast CA   DILATION AND CURETTAGE OF UTERUS  1985   MASTECTOMY Left 08/2017   TOTAL MASTECTOMY Right 03/08/2022   Procedure: RIGHT  MASTECTOMY;  Surgeon: Manus Rudd, MD;  Location: [facility] OR;  Service: General;  Laterality: Right;  PEC BLOCK   TUBAL LIGATION  1991   UNILATERAL SALPINGECTOMY  1985   Family History  Problem Relation Age of Onset   Alcohol abuse Mother    Lung cancer Mother    Esophageal cancer Mother    Throat cancer Mother    Alcohol abuse Father    Arthritis Father    Kidney disease Father    Dementia Father    Arthritis Sister    Diabetes Sister    Arthritis Brother    Hypertension Brother    Sickle cell anemia Brother    HIV Brother    Heart attack Brother 15       massive MI   Thyroid disease Brother    Social History   Socioeconomic History   Marital status: Married    Spouse name: Not on file   Number of children: 2   Years of education: associates   Highest education level: Not on file  Occupational History   Not on file  Tobacco Use   Smoking status: Every Day    Packs/day: 0.25    Years: 42.00    Additional pack years: 0.00    Total pack years: 10.50    Types: Cigarettes   Smokeless tobacco: Never  Vaping Use   Vaping Use: Some days   Substances: Nicotine  Substance and Sexual Activity   Alcohol use: Not Currently    Comment: quit in 1999   Drug use: Not Currently    Comment: quit in 1996-street drugs (coccaine, marijuana)   Sexual activity: Not Currently   Other Topics Concern   Not on file  Social History Narrative   02/13/19   From: Born in [place] but grew up in [place]   Living: with husband   Work: currently on disability      Family: Daughter - Social worker (biological daughter) Son - [name] (biological son) has one step son       Enjoys: walking, picnics, bowling, reading, playing cards      Exercise: walking - tries to do everyday - uses fitbit and tries to get 5000 steps   Diet: veggies, water, 2-3 cups  of coffee, trying to monitor what she eats      Safety   Seat belts: Yes    Guns: No   Safe in relationships: Yes    Social Determinants of Health   Financial Resource Strain: Medium Risk (07/07/2022)   Overall Financial Resource Strain (CARDIA)    Difficulty of Paying Living Expenses: Somewhat hard  Food Insecurity: Food Insecurity Present (07/07/2022)   Hunger Vital Sign    Worried About Running Out of Food in the Last Year: Sometimes true    Ran Out of Food in the Last Year: Sometimes true  Transportation Needs: No Transportation Needs (07/07/2022)   PRAPARE - Administrator, Civil Service (Medical): No    Lack of Transportation (Non-Medical): No  Physical Activity: Inactive (07/07/2022)   Exercise Vital Sign    Days of Exercise per Week: 0 days    Minutes of Exercise per Session: 0 min  Stress: No Stress Concern Present (07/07/2022)   [employer] of Occupational Health - Occupational Stress Questionnaire    Feeling of Stress : Only a little  Social Connections: Unknown (07/07/2022)   Social Connection and Isolation Panel [NHANES]    Frequency of Communication with Friends and Family: More than three times a week    Frequency of Social Gatherings with Friends and Family: More than three times a week    Attends Religious Services: Not on Marketing executive or Organizations: No    Attends Engineer, structural: More than 4 times per year    Marital Status: Married    Tobacco  Counseling Ready to quit: Not Answered Counseling given: Not Answered   Clinical Intake:  Pre-visit preparation completed: Yes  Pain : No/denies pain     BMI - recorded: 40.42 Nutritional Status: BMI > 30  Obese Nutritional Risks: None Diabetes: No  How often do you need to have someone help you when you read instructions, pamphlets, or other written materials from your doctor or pharmacy?: 1 - Never   Activities of Daily Living    07/07/2022    2:29 PM 03/08/2022    1:41 PM  In your present state of health, do you have any difficulty performing the following activities:  Hearing? 0 0  Vision? 1 0  Comment open angle glaucoma-wears readers   Difficulty concentrating or making decisions? 0 0  Walking or climbing stairs? 1 0  Dressing or bathing? 1 0  Doing errands, shopping? 1 0  Preparing Food and eating ? N   Using the Toilet? N   In the past six months, have you accidently leaked urine? N   Do you have problems with loss of bowel control? N   Managing your Medications? N   Managing your Finances? N     Patient Care Team: Sandford Craze, NP as PCP - General (Internal Medicine) Revankar, Aundra Dubin, MD as PCP - Cardiology (Cardiology) Rickard Patience, MD as Consulting Physician (Oncology) Josph Macho, MD as Medical Oncologist (Oncology) Gwendel Hanson, RN as Oncology Nurse Navigator  Indicate any recent Medical Services you may have received from other than [facility] providers in the past year (date may be approximate).     Assessment:   This is a routine wellness examination for [place].  Hearing/Vision screen No results found.  Dietary issues and exercise activities discussed: Current Exercise Habits: The patient does not participate in regular exercise at present, Exercise limited by: Other - see comments (just finished  chemo for breast cancer)   Goals Addressed   None    Depression Screen    07/13/2022    9:48 AM 06/09/2021    9:41 AM 02/13/2019    11:03 AM  PHQ 2/9 Scores  PHQ - 2 Score 0 0 1    Fall Risk    07/13/2022    9:47 AM 07/07/2022    2:29 PM 06/09/2021    9:35 AM  Fall Risk   Falls in the past year? 0 0 0  Number falls in past yr: 0 0 0  Injury with Fall? 0 0   Risk for fall due to : No Fall Risks No Fall Risks   Follow up Falls evaluation completed Falls evaluation completed     FALL RISK PREVENTION PERTAINING TO THE HOME:  Any stairs in or around the home? Yes  If so, are there any without handrails? No  Home free of loose throw rugs in walkways, pet beds, electrical cords, etc? Yes  Adequate lighting in your home to reduce risk of falls? Yes   ASSISTIVE DEVICES UTILIZED TO PREVENT FALLS:  Life alert? No  Use of a cane, walker or w/c? No  Grab bars in the bathroom? No  Shower chair or bench in shower? No  Elevated toilet seat or a handicapped toilet? No   TIMED UP AND GO:  Was the test performed?  No, audio visit .    Cognitive Function:        07/13/2022    9:57 AM  6CIT Screen  What Year? 0 points  What month? 0 points  What time? 0 points  Count back from 20 0 points  Months in reverse 0 points  Repeat phrase 0 points  Total Score 0 points    Immunizations Immunization History  Administered Date(s) Administered   PFIZER(Purple Top)SARS-COV-2 Vaccination 07/05/2019, 07/29/2019    TDAP status: Due, Education has been provided regarding the importance of this vaccine. Advised may receive this vaccine at local pharmacy or Health Dept. Aware to provide a copy of the vaccination record if obtained from local pharmacy or Health Dept. Verbalized acceptance and understanding.  Flu Vaccine status: Declined, Education has been provided regarding the importance of this vaccine but patient still declined. Advised may receive this vaccine at local pharmacy or Health Dept. Aware to provide a copy of the vaccination record if obtained from local pharmacy or Health Dept. Verbalized acceptance and  understanding.  Pneumococcal vaccine status: Due, Education has been provided regarding the importance of this vaccine. Advised may receive this vaccine at local pharmacy or Health Dept. Aware to provide a copy of the vaccination record if obtained from local pharmacy or Health Dept. Verbalized acceptance and understanding.  Covid-19 vaccine status: Information provided on how to obtain vaccines.   Qualifies for Shingles Vaccine? Yes   Zostavax completed No   Shingrix Completed?: No.    Education has been provided regarding the importance of this vaccine. Patient has been advised to call insurance company to determine out of pocket expense if they have not yet received this vaccine. Advised may also receive vaccine at local pharmacy or Health Dept. Verbalized acceptance and understanding.  Screening Tests Health Maintenance  Topic Date Due   DTaP/Tdap/Td (1 - Tdap) Never done   Zoster Vaccines- Shingrix (1 of 2) Never done   COLONOSCOPY (Pts 45-41yrs Insurance coverage will need to be confirmed)  Never done   COVID-19 Vaccine (3 - Pfizer risk series) 08/26/2019   Medicare  Annual Wellness (AWV)  06/10/2022   INFLUENZA VACCINE  10/27/2022   MAMMOGRAM  01/06/2024   PAP SMEAR-Modifier  06/09/2024   Hepatitis C Screening  Completed   HIV Screening  Completed   HPV VACCINES  Aged Out   Lung Cancer Screening  Discontinued    Health Maintenance  Health Maintenance Due  Topic Date Due   DTaP/Tdap/Td (1 - Tdap) Never done   Zoster Vaccines- Shingrix (1 of 2) Never done   COLONOSCOPY (Pts 45-56yrs Insurance coverage will need to be confirmed)  Never done   COVID-19 Vaccine (3 - Pfizer risk series) 08/26/2019   Medicare Annual Wellness (AWV)  06/10/2022    Colorectal cancer screening: Type of screening: Cologuard. Completed 04/13/22. Repeat every 3 years  Mammogram status: Completed 01/05/22. Repeat every year  Lung Cancer Screening: (Low Dose CT Chest recommended if Age 74-80 years, 30  pack-year currently smoking OR have quit w/in 15years.) does not qualify.   Additional Screening:  Hepatitis C Screening: does qualify; Completed 03/23/20  Vision Screening: Recommended annual ophthalmology exams for early detection of glaucoma and other disorders of the eye. Is the patient up to date with their annual eye exam?  Yes  Who is the provider or what is the name of the office in which the patient attends annual eye exams? Dr. Elige Radon [facility] If pt is not established with a provider, would they like to be referred to a provider to establish care? No .   Dental Screening: Recommended annual dental exams for proper oral hygiene  Community Resource Referral / Chronic Care Management: CRR required this visit?  No   CCM required this visit?  No      Plan:     I have personally reviewed and noted the following in the patient's chart:   Medical and social history Use of alcohol, tobacco or illicit drugs  Current medications and supplements including opioid prescriptions. Patient is not currently taking opioid prescriptions. Functional ability and status Nutritional status Physical activity Advanced directives List of other physicians Hospitalizations, surgeries, and ER visits in previous 12 months Vitals Screenings to include cognitive, depression, and falls Referrals and appointments  In addition, I have reviewed and discussed with patient certain preventive protocols, quality metrics, and best practice recommendations. A written personalized care plan for preventive services as well as general preventive health recommendations were provided to patient.   Due to this being a telephonic visit, the after visit summary with patients personalized plan was offered to patient via mail or my-chart. Patient would like to access on my-chart.  Donne Anon, [place]   07/13/2022   Nurse Notes: None

## 2022-07-13 NOTE — Patient Instructions (Signed)
Jaime [name] , Thank you for taking time to come for your Medicare Wellness Visit. I appreciate your ongoing commitment to your health goals. Please review the following plan we discussed and let me know if I can assist you in the future.      This is a list of the screening recommended for you and due dates:  Health Maintenance  Topic Date Due   DTaP/Tdap/Td vaccine (1 - Tdap) Never done   Zoster (Shingles) Vaccine (1 of 2) Never done   COVID-19 Vaccine (3 - Pfizer risk series) 08/26/2019   Flu Shot  10/27/2022   Medicare Annual Wellness Visit  07/13/2023   Mammogram  01/06/2024   Pap Smear  06/09/2024   Cologuard (Stool DNA test)  04/13/2025   Hepatitis C Screening: USPSTF Recommendation to screen - Ages 18-79 yo.  Completed   HIV Screening  Completed   HPV Vaccine  Aged Out   Screening for Lung Cancer  Discontinued      Next appointment: Follow up in one year for your annual wellness visit.   Preventive Care 40-64 Years, Female Preventive care refers to lifestyle choices and visits with your health care provider that can promote health and wellness. What does preventive care include? A yearly physical exam. This is also called an annual well check. Dental exams once or twice a year. Routine eye exams. Ask your health care provider how often you should have your eyes checked. Personal lifestyle choices, including: Daily care of your teeth and gums. Regular physical activity. Eating a healthy diet. Avoiding tobacco and drug use. Limiting alcohol use. Practicing safe sex. Taking low-dose aspirin daily starting at age 106. Taking vitamin and mineral supplements as recommended by your health care provider. What happens during an annual well check? The services and screenings done by your health care provider during your annual well check will depend on your age, overall health, lifestyle risk factors, and family history of disease. Counseling  Your health care provider may  ask you questions about your: Alcohol use. Tobacco use. Drug use. Emotional well-being. Home and relationship well-being. Sexual activity. Eating habits. Work and work Astronomer. Method of birth control. Menstrual cycle. Pregnancy history. Screening  You may have the following tests or measurements: Height, weight, and BMI. Blood pressure. Lipid and cholesterol levels. These may be checked every 5 years, or more frequently if you are over 51 years old. Skin check. Lung cancer screening. You may have this screening every year starting at age 40 if you have a 30-pack-year history of smoking and currently smoke or have quit within the past 15 years. Fecal occult blood test (FOBT) of the stool. You may have this test every year starting at age 80. Flexible sigmoidoscopy or colonoscopy. You may have a sigmoidoscopy every 5 years or a colonoscopy every 10 years starting at age 33. Hepatitis C blood test. Hepatitis B blood test. Sexually transmitted disease (STD) testing. Diabetes screening. This is done by checking your blood sugar (glucose) after you have not eaten for a while (fasting). You may have this done every 1-3 years. Mammogram. This may be done every 1-2 years. Talk to your health care provider about when you should start having regular mammograms. This may depend on whether you have a family history of breast cancer. BRCA-related cancer screening. This may be done if you have a family history of breast, ovarian, tubal, or peritoneal cancers. Pelvic exam and Pap test. This may be done every 3 years starting at age  21. Starting at age 77, this may be done every 5 years if you have a Pap test in combination with an HPV test. Bone density scan. This is done to screen for osteoporosis. You may have this scan if you are at high risk for osteoporosis. Discuss your test results, treatment options, and if necessary, the need for more tests with your health care provider. Vaccines  Your  health care provider may recommend certain vaccines, such as: Influenza vaccine. This is recommended every year. Tetanus, diphtheria, and acellular pertussis (Tdap, Td) vaccine. You may need a Td booster every 10 years. Zoster vaccine. You may need this after age 18. Pneumococcal 13-valent conjugate (PCV13) vaccine. You may need this if you have certain conditions and were not previously vaccinated. Pneumococcal polysaccharide (PPSV23) vaccine. You may need one or two doses if you smoke cigarettes or if you have certain conditions. Talk to your health care provider about which screenings and vaccines you need and how often you need them. This information is not intended to replace advice given to you by your health care provider. Make sure you discuss any questions you have with your health care provider. Document Released: 04/10/2015 Document Revised: 12/02/2015 Document Reviewed: 01/13/2015 Elsevier Interactive Patient Education  2017 [employer].    Fall Prevention in the Home Falls can cause injuries. They can happen to people of all ages. There are many things you can do to make your home safe and to help prevent falls. What can I do on the outside of my home? Regularly fix the edges of walkways and driveways and fix any cracks. Remove anything that might make you trip as you walk through a door, such as a raised step or threshold. Trim any bushes or trees on the path to your home. Use bright outdoor lighting. Clear any walking paths of anything that might make someone trip, such as rocks or tools. Regularly check to see if handrails are loose or broken. Make sure that both sides of any steps have handrails. Any raised decks and porches should have guardrails on the edges. Have any leaves, snow, or ice cleared regularly. Use sand or salt on walking paths during winter. Clean up any spills in your garage right away. This includes oil or grease spills. What can I do in the  bathroom? Use night lights. Install grab bars by the toilet and in the tub and shower. Do not use towel bars as grab bars. Use non-skid mats or decals in the tub or shower. If you need to sit down in the shower, use a plastic, non-slip stool. Keep the floor dry. Clean up any water that spills on the floor as soon as it happens. Remove soap buildup in the tub or shower regularly. Attach bath mats securely with double-sided non-slip rug tape. Do not have throw rugs and other things on the floor that can make you trip. What can I do in the bedroom? Use night lights. Make sure that you have a light by your bed that is easy to reach. Do not use any sheets or blankets that are too big for your bed. They should not hang down onto the floor. Have a firm chair that has side arms. You can use this for support while you get dressed. Do not have throw rugs and other things on the floor that can make you trip. What can I do in the kitchen? Clean up any spills right away. Avoid walking on wet floors. Keep items that you  use a lot in easy-to-reach places. If you need to reach something above you, use a strong step stool that has a grab bar. Keep electrical cords out of the way. Do not use floor polish or wax that makes floors slippery. If you must use wax, use non-skid floor wax. Do not have throw rugs and other things on the floor that can make you trip. What can I do with my stairs? Do not leave any items on the stairs. Make sure that there are handrails on both sides of the stairs and use them. Fix handrails that are broken or loose. Make sure that handrails are as long as the stairways. Check any carpeting to make sure that it is firmly attached to the stairs. Fix any carpet that is loose or worn. Avoid having throw rugs at the top or bottom of the stairs. If you do have throw rugs, attach them to the floor with carpet tape. Make sure that you have a light switch at the top of the stairs and the  bottom of the stairs. If you do not have them, ask someone to add them for you. What else can I do to help prevent falls? Wear shoes that: Do not have high heels. Have rubber bottoms. Are comfortable and fit you well. Are closed at the toe. Do not wear sandals. If you use a stepladder: Make sure that it is fully opened. Do not climb a closed stepladder. Make sure that both sides of the stepladder are locked into place. Ask someone to hold it for you, if possible. Clearly mark and make sure that you can see: Any grab bars or handrails. First and last steps. Where the edge of each step is. Use tools that help you move around (mobility aids) if they are needed. These include: Canes. Walkers. Scooters. Crutches. Turn on the lights when you go into a dark area. Replace any light bulbs as soon as they burn out. Set up your furniture so you have a clear path. Avoid moving your furniture around. If any of your floors are uneven, fix them. If there are any pets around you, be aware of where they are. Review your medicines with your doctor. Some medicines can make you feel dizzy. This can increase your chance of falling. Ask your doctor what other things that you can do to help prevent falls. This information is not intended to replace advice given to you by your health care provider. Make sure you discuss any questions you have with your health care provider. Document Released: 01/08/2009 Document Revised: 08/20/2015 Document Reviewed: 04/18/2014 Elsevier Interactive Patient Education  2017 [employer].

## 2022-08-03 ENCOUNTER — Inpatient Hospital Stay (HOSPITAL_BASED_OUTPATIENT_CLINIC_OR_DEPARTMENT_OTHER): Payer: Medicare Other | Admitting: Hematology & Oncology

## 2022-08-03 ENCOUNTER — Encounter: Payer: Self-pay | Admitting: *Deleted

## 2022-08-03 ENCOUNTER — Inpatient Hospital Stay: Payer: Medicare Other

## 2022-08-03 ENCOUNTER — Inpatient Hospital Stay: Payer: Medicare Other | Attending: Family

## 2022-08-03 ENCOUNTER — Encounter: Payer: Self-pay | Admitting: Hematology & Oncology

## 2022-08-03 VITALS — BP 121/58 | HR 90 | Temp 98.2°F | Resp 20 | Ht 63.25 in | Wt 238.1 lb

## 2022-08-03 DIAGNOSIS — Z9221 Personal history of antineoplastic chemotherapy: Secondary | ICD-10-CM | POA: Insufficient documentation

## 2022-08-03 DIAGNOSIS — G629 Polyneuropathy, unspecified: Secondary | ICD-10-CM | POA: Insufficient documentation

## 2022-08-03 DIAGNOSIS — Z170 Estrogen receptor positive status [ER+]: Secondary | ICD-10-CM | POA: Insufficient documentation

## 2022-08-03 DIAGNOSIS — M818 Other osteoporosis without current pathological fracture: Secondary | ICD-10-CM

## 2022-08-03 DIAGNOSIS — C50011 Malignant neoplasm of nipple and areola, right female breast: Secondary | ICD-10-CM

## 2022-08-03 DIAGNOSIS — C50911 Malignant neoplasm of unspecified site of right female breast: Secondary | ICD-10-CM | POA: Diagnosis present

## 2022-08-03 DIAGNOSIS — Z79811 Long term (current) use of aromatase inhibitors: Secondary | ICD-10-CM | POA: Insufficient documentation

## 2022-08-03 LAB — CBC WITH DIFFERENTIAL (CANCER CENTER ONLY)
Abs Immature Granulocytes: 0.01 10*3/uL (ref 0.00–0.07)
Basophils Absolute: 0 10*3/uL (ref 0.0–0.1)
Basophils Relative: 1 %
Eosinophils Absolute: 0.1 10*3/uL (ref 0.0–0.5)
Eosinophils Relative: 3 %
HCT: 32.9 % — ABNORMAL LOW (ref 36.0–46.0)
Hemoglobin: 10.7 g/dL — ABNORMAL LOW (ref 12.0–15.0)
Immature Granulocytes: 0 %
Lymphocytes Relative: 45 %
Lymphs Abs: 1.5 10*3/uL (ref 0.7–4.0)
MCH: 30.9 pg (ref 26.0–34.0)
MCHC: 32.5 g/dL (ref 30.0–36.0)
MCV: 95.1 fL (ref 80.0–100.0)
Monocytes Absolute: 0.7 10*3/uL (ref 0.1–1.0)
Monocytes Relative: 21 %
Neutro Abs: 1 10*3/uL — ABNORMAL LOW (ref 1.7–7.7)
Neutrophils Relative %: 30 %
Platelet Count: 193 10*3/uL (ref 150–400)
RBC: 3.46 MIL/uL — ABNORMAL LOW (ref 3.87–5.11)
RDW: 15.8 % — ABNORMAL HIGH (ref 11.5–15.5)
WBC Count: 3.3 10*3/uL — ABNORMAL LOW (ref 4.0–10.5)
nRBC: 0 % (ref 0.0–0.2)

## 2022-08-03 LAB — CMP (CANCER CENTER ONLY)
ALT: 5 U/L (ref 0–44)
AST: 12 U/L — ABNORMAL LOW (ref 15–41)
Albumin: 3.8 g/dL (ref 3.5–5.0)
Alkaline Phosphatase: 55 U/L (ref 38–126)
Anion gap: 9 (ref 5–15)
BUN: 18 mg/dL (ref 8–23)
CO2: 25 mmol/L (ref 22–32)
Calcium: 9.1 mg/dL (ref 8.9–10.3)
Chloride: 106 mmol/L (ref 98–111)
Creatinine: 0.62 mg/dL (ref 0.44–1.00)
GFR, Estimated: >60 mL/min (ref >60)
Glucose, Bld: 108 mg/dL — ABNORMAL HIGH (ref 70–99)
Potassium: 4.1 mmol/L (ref 3.5–5.1)
Sodium: 140 mmol/L (ref 135–145)
Total Bilirubin: 0.5 mg/dL (ref 0.3–1.2)
Total Protein: 6 g/dL — ABNORMAL LOW (ref 6.5–8.1)

## 2022-08-03 MED ORDER — LETROZOLE 2.5 MG PO TABS: 2.5000 mg | ORAL_TABLET | Freq: Every day | ORAL | 12 refills | Status: DC

## 2022-08-03 MED ORDER — TRIAMTERENE-HCTZ 37.5-25 MG PO TABS: 1.0000 | ORAL_TABLET | Freq: Every day | ORAL | 3 refills | Status: DC

## 2022-08-03 NOTE — Progress Notes (Signed)
[facility] Hematology and Oncology Follow Up Visit  Jaime [name] [id number] 03/26/1961 62 y.o. 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 daily-start 7 years on 08/03/2022 Zometa 4 mg IV every 6 months --next dose in 10/2022     Interim History:  Jaime [name] is back for follow-up.  She has completed all of her adjuvant chemotherapy.  She did quite well.  She did have little bit of neuropathy.  We gave her 5 cycles of treatment.  I really thought that 5 cycles would be appropriate.  She has little bit of swelling in the legs.  This might be from anemia.  She is little bit anemic.  I had to believe that the anemia will improve once she is further away from chemotherapy.  I think we can now start her on Femara.  Her tumor is ER positive.  As such, I think tomorrow would be reasonable for her.  I told her that she may have some arthralgias with the Femara.  She is already taking weekly high-dose vitamin D.  She also may have some increase in hot flashes.  She has had no cough or shortness of breath.  She has had no obvious change in bowel or bladder habits.  She has had no rashes.  Overall, I would say that her performance status is probably ECOG 1.    Medications:  Current Outpatient Medications:    aspirin EC 81 MG tablet, Take 81 mg by mouth daily. Swallow whole., Disp: , Rfl:    ergocalciferol (VITAMIN D2) 1.25 MG (50000 UT) capsule, Take 1 capsule (50,000 Units total) by mouth once a week., Disp: 8 capsule, Rfl: 3   ibuprofen (ADVIL) 200 MG tablet, Take 400 mg by mouth every 6 (six) hours as needed for moderate pain., Disp: , Rfl:    latanoprost (XALATAN) 0.005 % ophthalmic solution, Place 1 drop into both  eyes daily., Disp: , Rfl:    nitroGLYCERIN (NITROSTAT) 0.4 MG SL tablet, Place 1 tablet (0.4 mg total) under the tongue every 5 (five) minutes as needed for chest pain. (Patient not taking: Reported on 05/24/2022), Disp: 25 tablet, Rfl: 6  Allergies:  Allergies  Allergen Reactions   Atorvastatin     constipated   Latex Hives    Allergic reaction to condoms years ago.    Past Medical History, Surgical history, Social history, and Family History were reviewed and updated.  Review of Systems: Review of Systems  Constitutional: Negative.   HENT:  Negative.    Eyes: Negative.   Respiratory: Negative.    Cardiovascular: Negative.   Gastrointestinal: Negative.   Endocrine: Negative.   Genitourinary: Negative.    Musculoskeletal: Negative.   Skin: Negative.   Neurological: Negative.   Hematological: Negative.   Psychiatric/Behavioral: Negative.      Physical Exam:  height is 5' 3.25" (1.607 m) and weight is 238 lb 1.9 oz (108 kg). Her oral temperature is 98.2 F (36.8 C). Her blood pressure is 121/58 (abnormal) and her pulse is 90. Her respiration is 20 and oxygen saturation is 100%.   Wt Readings from Last 3 Encounters:  08/03/22 238 lb 1.9 oz (108 kg)  07/13/22 230 lb (104.3 kg)  07/05/22 232 lb 12.8 oz (105.6 kg)  Physical Exam Vitals reviewed.  Constitutional:      Comments: Chest wall exam shows left chest wall with mastectomy.  She has no chest wall nodules.  There is no left axillary adenopathy.  Right chest wall shows the healing mastectomy.  There is no discharge.  There is no erythema.  There is no warmth or swelling.  There is no right axillary adenopathy.  HENT:     Head: Normocephalic and atraumatic.  Eyes:     Pupils: Pupils are equal, round, and reactive to light.  Cardiovascular:     Rate and Rhythm: Normal rate and regular rhythm.     Heart sounds: Normal heart sounds.  Pulmonary:     Effort: Pulmonary effort is normal.     Breath sounds: Normal breath  sounds.  Abdominal:     General: Bowel sounds are normal.     Palpations: Abdomen is soft.  Musculoskeletal:        General: No tenderness or deformity. Normal range of motion.     Cervical back: Normal range of motion.  Lymphadenopathy:     Cervical: No cervical adenopathy.  Skin:    General: Skin is warm and dry.     Findings: No erythema or rash.  Neurological:     Mental Status: She is alert and oriented to person, place, and time.  Psychiatric:        Behavior: Behavior normal.        Thought Content: Thought content normal.        Judgment: Judgment normal.      Lab Results  Component Value Date   WBC 3.3 (L) 08/03/2022   HGB 10.7 (L) 08/03/2022   HCT 32.9 (L) 08/03/2022   MCV 95.1 08/03/2022   PLT 193 08/03/2022     Chemistry      Component Value Date/Time   NA 138 07/05/2022 0954   NA 143 02/16/2022 1006   K 4.2 07/05/2022 0954   CL 105 07/05/2022 0954   CO2 25 07/05/2022 0954   BUN 11 07/05/2022 0954   BUN 13 02/16/2022 1006   CREATININE 0.53 07/05/2022 0954      Component Value Date/Time   CALCIUM 9.2 07/05/2022 0954   ALKPHOS 74 07/05/2022 0954   AST 12 (L) 07/05/2022 0954   ALT 9 07/05/2022 0954   BILITOT 0.4 07/05/2022 0954      Impression and Plan: Jaime [name] is a very charming 62 year old postmenopausal African-American female.  This is her third breast cancer.  I do not believe this is a recurrence.  I believe this is a new breast cancer.  I am not sure as to why she keeps having breast cancer.  I think she underwent genetic testing.  I think the genetic testing was negative.  She has completed her adjuvant chemotherapy.  We now have her on Femara.  She will start Femara this week.  I think she will need 7 years of Femara.  I do not think that we need anything additional to the Femara.  I really would not consider this high risk disease for recurrence.  We do have her on Zometa.  I think Zometa will be given in August.  I would like to  have her come back in about 2 months.  If all looks good in 2 months, then we will try to see her every 3 months.  She still has her Port-A-Cath in.  She would like to keep the Port-A-Cath in.  I agree with this  since she has been limited IV access.    Josph Macho, MD 5/8/20249:11 AM

## 2022-08-03 NOTE — Progress Notes (Signed)
Patient has completed her systemic therapy and will now proceed to AI.   Oncology Nurse Navigator Documentation     08/03/2022   11:30 AM  Oncology Nurse Navigator Flowsheets  Phase of Treatment AI  Aromatase Inhibitor Actual Start Date: 08/03/2022  Navigator Follow Up Date: 09/21/2022  Navigator Follow Up Reason: Follow-up Appointment  Navigator Location [facility]  Navigator Encounter Type Appt/Treatment Plan Review  Patient Visit Type MedOnc  Treatment Phase Active Tx  Barriers/Navigation Needs No Barriers At This Time  Interventions None Required  Acuity Level 1-No Barriers  Support Groups/Services Friends and Family  Time Spent with Patient 15

## 2022-09-21 ENCOUNTER — Inpatient Hospital Stay: Payer: Medicare Other

## 2022-09-21 ENCOUNTER — Inpatient Hospital Stay (HOSPITAL_BASED_OUTPATIENT_CLINIC_OR_DEPARTMENT_OTHER): Payer: Medicare Other | Admitting: Hematology & Oncology

## 2022-09-21 ENCOUNTER — Encounter: Payer: Self-pay | Admitting: *Deleted

## 2022-09-21 ENCOUNTER — Inpatient Hospital Stay: Payer: Medicare Other | Attending: Family

## 2022-09-21 ENCOUNTER — Encounter: Payer: Self-pay | Admitting: Hematology & Oncology

## 2022-09-21 VITALS — BP 142/63 | HR 74 | Temp 98.4°F | Resp 20 | Ht 63.25 in | Wt 224.0 lb

## 2022-09-21 DIAGNOSIS — Z170 Estrogen receptor positive status [ER+]: Secondary | ICD-10-CM | POA: Diagnosis not present

## 2022-09-21 DIAGNOSIS — C50911 Malignant neoplasm of unspecified site of right female breast: Secondary | ICD-10-CM | POA: Diagnosis not present

## 2022-09-21 DIAGNOSIS — C50012 Malignant neoplasm of nipple and areola, left female breast: Secondary | ICD-10-CM | POA: Diagnosis not present

## 2022-09-21 DIAGNOSIS — M818 Other osteoporosis without current pathological fracture: Secondary | ICD-10-CM

## 2022-09-21 DIAGNOSIS — C50011 Malignant neoplasm of nipple and areola, right female breast: Secondary | ICD-10-CM

## 2022-09-21 DIAGNOSIS — Z79811 Long term (current) use of aromatase inhibitors: Secondary | ICD-10-CM | POA: Diagnosis not present

## 2022-09-21 LAB — CBC WITH DIFFERENTIAL (CANCER CENTER ONLY)
Abs Immature Granulocytes: 0 10*3/uL (ref 0.00–0.07)
Basophils Absolute: 0 10*3/uL (ref 0.0–0.1)
Basophils Relative: 1 %
Eosinophils Absolute: 0.1 10*3/uL (ref 0.0–0.5)
Eosinophils Relative: 4 %
HCT: 37.9 % (ref 36.0–46.0)
Hemoglobin: 12.3 g/dL (ref 12.0–15.0)
Immature Granulocytes: 0 %
Lymphocytes Relative: 59 %
Lymphs Abs: 1.8 10*3/uL (ref 0.7–4.0)
MCH: 29.6 pg (ref 26.0–34.0)
MCHC: 32.5 g/dL (ref 30.0–36.0)
MCV: 91.1 fL (ref 80.0–100.0)
Monocytes Absolute: 0.3 10*3/uL (ref 0.1–1.0)
Monocytes Relative: 11 %
Neutro Abs: 0.8 10*3/uL — ABNORMAL LOW (ref 1.7–7.7)
Neutrophils Relative %: 25 %
Platelet Count: 192 10*3/uL (ref 150–400)
RBC: 4.16 MIL/uL (ref 3.87–5.11)
RDW: 11.9 % (ref 11.5–15.5)
WBC Count: 3 10*3/uL — ABNORMAL LOW (ref 4.0–10.5)
nRBC: 0 % (ref 0.0–0.2)

## 2022-09-21 LAB — CMP (CANCER CENTER ONLY)
ALT: 7 U/L (ref 0–44)
AST: 12 U/L — ABNORMAL LOW (ref 15–41)
Albumin: 4 g/dL (ref 3.5–5.0)
Alkaline Phosphatase: 65 U/L (ref 38–126)
Anion gap: 6 (ref 5–15)
BUN: 16 mg/dL (ref 8–23)
CO2: 28 mmol/L (ref 22–32)
Calcium: 9.8 mg/dL (ref 8.9–10.3)
Chloride: 107 mmol/L (ref 98–111)
Creatinine: 0.66 mg/dL (ref 0.44–1.00)
GFR, Estimated: >60 mL/min (ref >60)
Glucose, Bld: 99 mg/dL (ref 70–99)
Potassium: 4.2 mmol/L (ref 3.5–5.1)
Sodium: 141 mmol/L (ref 135–145)
Total Bilirubin: 0.5 mg/dL (ref 0.3–1.2)
Total Protein: 6.3 g/dL — ABNORMAL LOW (ref 6.5–8.1)

## 2022-09-21 LAB — VITAMIN D 25 HYDROXY (VIT D DEFICIENCY, FRACTURES): Vit D, 25-Hydroxy: 42.88 ng/mL (ref 30–100)

## 2022-09-21 LAB — LACTATE DEHYDROGENASE: LDH: 187 U/L (ref 98–192)

## 2022-09-21 NOTE — Progress Notes (Signed)
[facility] Hematology and Oncology Follow Up Visit  Jaime [name] [id number] May 07, 1960 62 y.o. 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 daily-start 7 years on 08/03/2022 Zometa 4 mg IV every 6 months --next dose in 10/2022     Interim History:  [name] is back for follow-up.  She is doing quite well.  She really has had no complaints.  She is doing well with the Femara.  There has been no hot flashes or sweats..  She has had no change in bowel or bladder habits..  She thinks her might be a little bit of fluid over on the right side where she had her mastectomy..  We like to see about her getting back to Dr. Corliss Skains for another evaluation.  She has had no bleeding.  There is been no rashes.  She has had no change in bowel or bladder habits.   She has had no headache.  There has been no mouth sores.  Overall, I would say that her performance status is probably ECOG 1.   Medications:  Current Outpatient Medications:    aspirin EC 81 MG tablet, Take 81 mg by mouth daily. Swallow whole., Disp: , Rfl:    ergocalciferol (VITAMIN D2) 1.25 MG (50000 UT) capsule, Take 1 capsule (50,000 Units total) by mouth once a week., Disp: 8 capsule, Rfl: 3   ibuprofen (ADVIL) 200 MG tablet, Take 400 mg by mouth every 6 (six) hours as needed for moderate pain., Disp: , Rfl:    latanoprost (XALATAN) 0.005 % ophthalmic solution, Place 1 drop into both eyes daily., Disp: , Rfl:    letrozole (FEMARA) 2.5 MG tablet, Take 1 tablet (2.5 mg total) by mouth daily., Disp: 30 tablet, Rfl: 12   triamterene-hydrochlorothiazide (MAXZIDE-25) 37.5-25 MG tablet, Take 1 tablet by mouth daily. Take daily for 3 days in a row and then take daily  as needed for leg swelling, Disp: 30 tablet, Rfl: 3   nitroGLYCERIN (NITROSTAT) 0.4 MG SL tablet, Place 1 tablet (0.4 mg total) under the tongue every 5 (five) minutes as needed for chest pain. (Patient not taking: Reported on 05/24/2022), Disp: 25 tablet, Rfl: 6  Allergies:  Allergies  Allergen Reactions   Atorvastatin     constipated   Latex Hives    Allergic reaction to condoms years ago.    Past Medical History, Surgical history, Social history, and Family History were reviewed and updated.  Review of Systems: Review of Systems  Constitutional: Negative.   HENT:  Negative.    Eyes: Negative.   Respiratory: Negative.    Cardiovascular: Negative.   Gastrointestinal: Negative.   Endocrine: Negative.   Genitourinary: Negative.    Musculoskeletal: Negative.   Skin: Negative.   Neurological: Negative.   Hematological: Negative.   Psychiatric/Behavioral: Negative.      Physical Exam:  height is 5' 3.25" (1.607 m) and weight is 224 lb (101.6 kg). Her oral temperature is 98.4 F (36.9 C). Her blood pressure is 142/63 (abnormal) and her pulse is 74. Her respiration is 20 and oxygen saturation is 100%.   Wt Readings from Last 3 Encounters:  09/21/22 224 lb (101.6 kg)  08/03/22 238 lb 1.9 oz (108 kg)  07/13/22 230 lb (104.3 kg)  Physical Exam Vitals reviewed.  Constitutional:      Comments: Chest wall exam shows left chest wall with mastectomy.  She has no chest wall nodules.  There is no left axillary adenopathy.  Right chest wall shows the healing mastectomy.  She has little bit of fluctuance in the lateral aspect of the mastectomy.  It is nontender.  There is some hyperpigmentation from past radiation.  There is no nipple discharge.  There is no erythema.    There is no right axillary adenopathy.  HENT:     Head: Normocephalic and atraumatic.  Eyes:     Pupils: Pupils are equal, round, and reactive to light.  Cardiovascular:     Rate and Rhythm: Normal rate and regular  rhythm.     Heart sounds: Normal heart sounds.  Pulmonary:     Effort: Pulmonary effort is normal.     Breath sounds: Normal breath sounds.  Abdominal:     General: Bowel sounds are normal.     Palpations: Abdomen is soft.  Musculoskeletal:        General: No tenderness or deformity. Normal range of motion.     Cervical back: Normal range of motion.  Lymphadenopathy:     Cervical: No cervical adenopathy.  Skin:    General: Skin is warm and dry.     Findings: No erythema or rash.  Neurological:     Mental Status: She is alert and oriented to person, place, and time.  Psychiatric:        Behavior: Behavior normal.        Thought Content: Thought content normal.        Judgment: Judgment normal.     Lab Results  Component Value Date   WBC 3.0 (L) 09/21/2022   HGB 12.3 09/21/2022   HCT 37.9 09/21/2022   MCV 91.1 09/21/2022   PLT 192 09/21/2022     Chemistry      Component Value Date/Time   NA 140 08/03/2022 0840   NA 143 02/16/2022 1006   K 4.1 08/03/2022 0840   CL 106 08/03/2022 0840   CO2 25 08/03/2022 0840   BUN 18 08/03/2022 0840   BUN 13 02/16/2022 1006   CREATININE 0.62 08/03/2022 0840      Component Value Date/Time   CALCIUM 9.1 08/03/2022 0840   ALKPHOS 55 08/03/2022 0840   AST 12 (L) 08/03/2022 0840   ALT 5 08/03/2022 0840   BILITOT 0.5 08/03/2022 0840      Impression and Plan: [name] is a very charming 62 year old postmenopausal African-American female.  This is her third breast cancer.  I do not believe this is a recurrence.  I believe this is a new breast cancer.  I am not sure as to why she keeps having breast cancer.  I think she underwent genetic testing.  I think the genetic testing was negative.  She has completed her adjuvant chemotherapy.  We now have her on Femara.    Again, it looks like there may be a little bit of a seroma with the right mastectomy.  We will see if surgery can help [place] out with this.  We will have her come back  she will start Femara this week.  I think she will need 7 years of Femara.  I do not think that we need anything additional to the Femara.  I really would not consider this high risk disease for recurrence.  We do have her on Zometa.  I think Zometa  will be given in September.  I would like to have her come back in about 3 months.     Josph Macho, MD 6/26/20249:36 AM

## 2022-09-21 NOTE — Patient Instructions (Signed)

## 2022-09-21 NOTE — Progress Notes (Signed)
Patient is doing well on Femara. Will continue on maintenance. Will discontinue active navigation at this time but be available as needed in the future.   Oncology Nurse Navigator Documentation     09/21/2022    9:15 AM  Oncology Nurse Navigator Flowsheets  Navigation Complete Date: 09/21/2022  Post Navigation: Continue to Follow Patient? No  Reason Not Navigating Patient: Patient On Maintenance Chemotherapy  Navigator Location [facility]  Navigator Encounter Type Follow-up Appt  Patient Visit Type MedOnc  Treatment Phase Active Tx  Barriers/Navigation Needs No Barriers At This Time  Interventions None Required  Acuity Level 1-No Barriers  Support Groups/Services Friends and Family  Time Spent with Patient 15

## 2022-09-22 ENCOUNTER — Other Ambulatory Visit: Payer: Self-pay

## 2022-09-22 LAB — CANCER ANTIGEN 27.29: CA 27.29: 14.8 U/mL (ref 0.0–38.6)

## 2022-09-23 ENCOUNTER — Ambulatory Visit: Payer: Self-pay | Admitting: Surgery

## 2022-09-24 ENCOUNTER — Ambulatory Visit: Payer: Self-pay | Admitting: Surgery

## 2022-09-24 NOTE — H&P (Signed)
Subjective  Chief Complaint: No chief complaint on file.     History of Present Illness: Jaime [name] is a 62 y.o. female who is seen today for breast cancer follow-up.    This is a 62 year old female with a very extensive past medical history who presents with a diagnosis of recurrent right breast cancer.  In 2004, the patient was diagnosed with right breast cancer.  She underwent a lumpectomy and axillary lymph node dissection.  She underwent radiation to her right breast and axilla and also had chemotherapy.  In 2019, the patient was diagnosed with multicentric left breast cancer.  She underwent neoadjuvant chemotherapy.  Subsequently she underwent a left mastectomy with sentinel lymph node biopsy.  She had 4 sentinel lymph nodes taken at that time and all were negative.  All of her previous cancer care was performed at [facility] in [place]. According to some of the pathology reports the cancer in 2019 was invasive ductal carcinoma Grade 3, ER/PR negative, HER2 positive.  I cannot find any pathology reports from her right breast surgery in 2004.   The patient denies any previous genetic testing.   Recently she underwent routine screening right mammogram.  This showed an area of calcifications in the lower central left breast with some focal asymmetry.  She underwent stereotactic biopsy on 11/6.  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  ER is 20% weakly positive, PR negative, Ki-67 20%, HER2 negative.    On 03/08/2022, she underwent right mastectomy.  This revealed invasive ductal carcinoma with overall gross tumor size of 2.8 cm.  Margins were negative.  Her drain was removed on 03/16/2022. Subsequently, the medial part of her incision has gapped open.  She has not noticed any drainage.  Her primary care provider empirically started her on Keflex.   She met with her oncologist.  Oncotype was very high risk with a score of 52.   She has completed chemotherapy via her right sided port and is experience significant fatigue.  She states that she does not really notice any drainage from the wound itself.   She was recently seen by Dr. Myna Hidalgo, who felt that she might have a persistent seroma behind her right mastectomy incision.    Medical History: Past Medical History: Diagnosis Date  Glaucoma (increased eye pressure)   History of cancer   Sleep apnea   Substance abuse (CMS/HHS-HCC)    Patient Active Problem List Diagnosis  Aortic atherosclerosis (CMS-HCC)  Atypical chest pain  Recurrent breast cancer, right (CMS/HHS-HCC)  DOE (dyspnea on exertion)  Emphysema lung (CMS/HHS-HCC)  History of alcohol abuse  Glaucoma  Personal history of chemotherapy  S/P left mastectomy   Past Surgical History: Procedure Laterality Date  LAPAROSCOPIC TUBAL LIGATION  1991  MASTECTOMY PARTIAL / LUMPECTOMY Right 2004  MASTECTOMY Left 2019  BREAST EXCISIONAL BIOPSY Right     Allergies Allergen Reactions  Atorvastatin Other (See Comments)   constipated  Latex Hives   Allergic reaction to condoms years ago.   Current Outpatient Medications on File Prior to Visit Medication Sig Dispense Refill  aspirin 81 MG chewable tablet Take 1 tablet by mouth once daily    ergocalciferol, vitamin D2, 1,250 mcg (50,000 unit) capsule Take 50,000 Units by mouth every 7 (seven) days    latanoprost (XALATAN) 0.005 % ophthalmic solution Apply 1 drop to eye once daily    letrozole (FEMARA) 2.5 mg tablet Take 2.5 mg by mouth once daily  nitroGLYcerin (NITROSTAT) 0.4 MG SL tablet Place 0.4 mg under the tongue every 5 (five) minutes as needed    atorvastatin (LIPITOR) 10 MG tablet Take 10 mg by mouth once daily (Patient not taking: Reported on 09/23/2022)    cephalexin (KEFLEX) 500 MG capsule Take 500 mg by mouth 3 (three) times daily (Patient not taking: Reported on 04/18/2022)    lidocaine-prilocaine (EMLA) cream Apply 1 Application   topically as needed    metoprolol tartrate (LOPRESSOR) 100 MG tablet  (Patient not taking: Reported on 09/23/2022)    No current facility-administered medications on file prior to visit.   Family History Problem Relation Age of Onset  Esophageal cancer Mother   Kidney disease Father   Diabetes Sister   High blood pressure (Hypertension) Brother     Social History  Tobacco Use Smoking Status Every Day  Types: Cigarettes Smokeless Tobacco Never    Social History  Socioeconomic History  Marital status: Married Tobacco Use  Smoking status: Every Day   Types: Cigarettes  Smokeless tobacco: Never Substance and Sexual Activity  Alcohol use: Not Currently  Drug use: Not Currently  Social Determinants of Health  Financial Resource Strain: Medium Risk (07/07/2022)  Received from [facility]  Overall Financial Resource Strain (CARDIA)   Difficulty of Paying Living Expenses: Somewhat hard Food Insecurity: Food Insecurity Present (07/07/2022)  Received from [facility]  Hunger Vital Sign   Worried About Running Out of Food in the Last Year: Sometimes true   Ran Out of Food in the Last Year: Sometimes true Transportation Needs: No Transportation Needs (07/07/2022)  Received from [facility] - Transportation   Lack of Transportation (Medical): No   Lack of Transportation (Non-Medical): No Physical Activity: Inactive (07/07/2022)  Received from [facility]  Exercise Vital Sign   Days of Exercise per Week: 0 days   Minutes of Exercise per Session: 0 min Stress: No Stress Concern Present (07/07/2022)  Received from [facility] - Occupational Stress Questionnaire   Feeling of Stress : Only a little Social Connections: Unknown (07/07/2022)  Received from [facility]  Social Connection and Isolation Panel [NHANES]   Frequency of Communication with Friends and Family: More than three times a week   Frequency of Social Gatherings  with Friends and Family: More than three times a week   Active Member of [employer] or Organizations: No   Attends Engineer, structural: More than 4 times per year   Marital Status: Married   Objective:   Vitals:  09/23/22 1021 PainSc: 0-No pain    Physical Exam   Well-developed well-nourished no apparent distress The right sided port shows no sign of infection. Her right mastectomy incision is now complete healed.  No openings.  No drainage.  No cellulitis.  Small amount of seroma underneath the flaps.  The patient declined any aspiration today.   Assessment and Plan: Diagnoses and all orders for this visit:  Recurrent breast cancer, right (CMS/HHS-HCC)     She seems to have a small localized seroma in the lateral part of her mastectomy incision.  She is also ready to have her port removed.  We will plan removal of the port under sedation.  We will also aspirate the seroma while she is sedated.  The surgical procedure has been discussed with the patient.  Potential risks, benefits, alternative treatments, and expected outcomes have been explained.  All of the patient's questions at this time have been answered.  The likelihood of reaching the patient's treatment goal is good.  The patient understand the proposed surgical procedure and wishes to proceed.  Wilmon Arms. Corliss Skains, MD, [facility], [facility]  09/24/2022 4:35 PM

## 2022-09-28 ENCOUNTER — Other Ambulatory Visit: Payer: Self-pay

## 2022-10-03 ENCOUNTER — Encounter: Payer: Self-pay | Admitting: Family

## 2022-10-03 ENCOUNTER — Ambulatory Visit (INDEPENDENT_AMBULATORY_CARE_PROVIDER_SITE_OTHER): Payer: Medicare Other | Admitting: Family

## 2022-10-03 VITALS — BP 101/85 | HR 86 | Ht 63.25 in | Wt 225.2 lb

## 2022-10-03 DIAGNOSIS — Z720 Tobacco use: Secondary | ICD-10-CM | POA: Diagnosis not present

## 2022-10-03 DIAGNOSIS — R195 Other fecal abnormalities: Secondary | ICD-10-CM

## 2022-10-03 DIAGNOSIS — Z853 Personal history of malignant neoplasm of breast: Secondary | ICD-10-CM

## 2022-10-03 DIAGNOSIS — R600 Localized edema: Secondary | ICD-10-CM

## 2022-10-03 HISTORY — DX: Other fecal abnormalities: R19.5

## 2022-10-03 HISTORY — DX: Localized edema: R60.0

## 2022-10-03 NOTE — Assessment & Plan Note (Addendum)
S/p mastectomy, chemo, maintained on Femara.  Management per oncology.

## 2022-10-03 NOTE — Assessment & Plan Note (Signed)
Patient continues to smoke 2-3 cigarettes per day. Discussed various cessation methods, but patient prefers to attempt quitting "cold [place]." -Encouraged to continue efforts to quit smoking.

## 2022-10-03 NOTE — Assessment & Plan Note (Signed)
Recent change in stool consistency, likely due to dietary changes or a minor gastrointestinal illness. No other symptoms reported.  -Advised to monitor stool consistency and report if it does not return to normal in a few days.

## 2022-10-03 NOTE — Progress Notes (Signed)
Subjective:     Patient ID: Jaime [name], female    DOB: 08/11/1960, 62 y.o.   MRN: [id number]  Chief Complaint  Patient presents with   Follow-up    Has appointment to get mediport removed     HPI  Discussed the use of AI scribe software for clinical note transcription with the patient, who gave verbal consent to proceed.  History of Present Illness   The patient, with a history of breast cancer, presents with a recent change in bowel habits, specifically soft stool. She denies feeling sick and has been on Femara, an estrogen blocker, since May. She also reports a potential interaction with grapefruit, which she recently consumed, and Femara.  The patient also discusses her ongoing struggle with smoking, averaging two to three cigarettes a day. She expresses a desire to quit cold [place], rejecting the suggestion of medication assistance.  Additionally, the patient has been taking triamterene hydrochlorothiazide as needed for swelling in her feet. She reports a numbing sensation on the bottom of her feet, which prompts her to take the medication.      Patient presents today for follow up.  She has been busy since her last visit completing chemotherapy for her Stage IIa (T2N0M0) infiltrating ductal carcinoma of the right breast- ER+/PR-/HER2- recurrent breast cancer. She is continued on femara with a plan for 7 year treatment and Zometa every 6 months.  She plans to have her port removed soon by surgeon.        Health Maintenance Due  Topic Date Due   DTaP/Tdap/Td (1 - Tdap) Never done   Zoster Vaccines- Shingrix (1 of 2) Never done   COVID-19 Vaccine (3 - Pfizer risk series) 08/26/2019    Past Medical History:  Diagnosis Date   Aortic atherosclerosis (HCC) 03/13/2020   Atypical chest pain 02/13/2019   Breast cancer (HCC)    BIL  mastectomy left    lumpectomy right   DOE (dyspnea on exertion) 12/29/2021   Emphysema lung (HCC) 03/13/2020   Emphysema lung (HCC)     Glaucoma    Heart murmur    ECHO scheduled 03/04/22   History of alcohol abuse    no drinking since 1999   History of drug abuse (HCC)    1996- last drug use- been clean since then   Hx of breast cancer 02/13/2019   Right (2004) pT1cN1 cM0 triple negative s/p lumpectomy and ALND with adjuvant ddAc-T and radiation Left (2018) - Stage IIA cT2N0M0 ER-/PR-/Her2+ and Stage IA CT1CN0 ER-/PR-/Her2- both ypT1aN0(sn) grade 3 - s/p 6C neoadjuvant chemo, and mastectomy and SLNB on 09/15/2017 (records scanned)   Lung mass 02/13/2019   Open-angle glaucoma 02/13/2019   Personal history of chemotherapy    Personal history of radiation therapy    Personal history of tobacco use, presenting hazards to health 03/12/2019   Right thyroid nodule    S/P left mastectomy 02/13/2019   Sleep apnea    mild per pt, no CPAP recommended   Tobacco use 02/13/2019    Past Surgical History:  Procedure Laterality Date   BREAST BIOPSY Left 2019   BREAST BIOPSY Right 01/31/2022   MM RT BREAST BX W LOC DEV 1ST LESION IMAGE BX SPEC STEREO GUIDE 01/31/2022 [facility]   BREAST LUMPECTOMY Right 2004   triple Neg breast CA   DILATION AND CURETTAGE OF UTERUS  1985   MASTECTOMY Left 08/2017   TOTAL MASTECTOMY Right 03/08/2022   Procedure: RIGHT  MASTECTOMY;  Surgeon: Corliss Skains,  Molli Hazard, MD;  Location: [facility] OR;  Service: General;  Laterality: Right;  PEC BLOCK   TUBAL LIGATION  1991   UNILATERAL SALPINGECTOMY  1985    Family History  Problem Relation Age of Onset   Alcohol abuse Mother    Lung cancer Mother    Esophageal cancer Mother    Throat cancer Mother    Alcohol abuse Father    Arthritis Father    Kidney disease Father    Dementia Father    Arthritis Sister    Diabetes Sister    Arthritis Brother    Hypertension Brother    Sickle cell anemia Brother    HIV Brother    Heart attack Brother 32       massive MI   Thyroid disease Brother     Social History   Socioeconomic History   Marital status:  Married    Spouse name: Not on file   Number of children: 2   Years of education: associates   Highest education level: Associate degree: academic program  Occupational History   Not on file  Tobacco Use   Smoking status: Every Day    Packs/day: 0.25    Years: 42.00    Additional pack years: 0.00    Total pack years: 10.50    Types: Cigarettes   Smokeless tobacco: Never  Vaping Use   Vaping Use: Some days   Substances: Nicotine  Substance and Sexual Activity   Alcohol use: Not Currently    Comment: quit in 1999   Drug use: Not Currently    Comment: quit in 1996-street drugs (coccaine, marijuana)   Sexual activity: Not Currently  Other Topics Concern   Not on file  Social History Narrative   02/13/19   From: Born in [place] but grew up in [place]   Living: with husband   Work: currently on disability      Family: Daughter - Social worker (biological daughter) Son - [name] (biological son) has one step son       Enjoys: walking, picnics, bowling, reading, playing cards      Exercise: walking - tries to do everyday - uses fitbit and tries to get 5000 steps   Diet: veggies, water, 2-3 cups of coffee, trying to monitor what she eats      Safety   Seat belts: Yes    Guns: No   Safe in relationships: Yes    Social Determinants of Health   Financial Resource Strain: Low Risk  (09/26/2022)   Overall Financial Resource Strain (CARDIA)    Difficulty of Paying Living Expenses: Not very hard  Recent Concern: Financial Resource Strain - Medium Risk (07/07/2022)   Overall Financial Resource Strain (CARDIA)    Difficulty of Paying Living Expenses: Somewhat hard  Food Insecurity: No Food Insecurity (09/26/2022)   Hunger Vital Sign    Worried About Running Out of Food in the Last Year: Never true    Ran Out of Food in the Last Year: Never true  Recent Concern: Food Insecurity - Food Insecurity Present (07/07/2022)   Hunger Vital Sign    Worried About Running Out of Food in the Last Year:  Sometimes true    Ran Out of Food in the Last Year: Sometimes true  Transportation Needs: No Transportation Needs (09/26/2022)   PRAPARE - Administrator, Civil Service (Medical): No    Lack of Transportation (Non-Medical): No  Physical Activity: Inactive (07/07/2022)   Exercise Vital Sign    Days  of Exercise per Week: 0 days    Minutes of Exercise per Session: 0 min  Stress: No Stress Concern Present (09/26/2022)   [employer] of Occupational Health - Occupational Stress Questionnaire    Feeling of Stress : Not at all  Social Connections: Unknown (09/26/2022)   Social Connection and Isolation Panel [NHANES]    Frequency of Communication with Friends and Family: More than three times a week    Frequency of Social Gatherings with Friends and Family: Three times a week    Attends Religious Services: Patient declined    Active Member of Clubs or Organizations: No    Attends Engineer, structural: More than 4 times per year    Marital Status: Married  Catering manager Violence: Not At Risk (07/13/2022)   Humiliation, Afraid, Rape, and Kick questionnaire    Fear of Current or Ex-Partner: No    Emotionally Abused: No    Physically Abused: No    Sexually Abused: No    Outpatient Medications Prior to Visit  Medication Sig Dispense Refill   aspirin EC 81 MG tablet Take 81 mg by mouth daily. Swallow whole.     ergocalciferol (VITAMIN D2) 1.25 MG (50000 UT) capsule Take 1 capsule (50,000 Units total) by mouth once a week. 8 capsule 3   ibuprofen (ADVIL) 200 MG tablet Take 400 mg by mouth every 6 (six) hours as needed for moderate pain.     latanoprost (XALATAN) 0.005 % ophthalmic solution Place 1 drop into both eyes daily.     letrozole (FEMARA) 2.5 MG tablet Take 1 tablet (2.5 mg total) by mouth daily. 30 tablet 12   triamterene-hydrochlorothiazide (MAXZIDE-25) 37.5-25 MG tablet Take 1 tablet by mouth daily. Take daily for 3 days in a row and then take daily as needed for  leg swelling 30 tablet 3   nitroGLYCERIN (NITROSTAT) 0.4 MG SL tablet Place 1 tablet (0.4 mg total) under the tongue every 5 (five) minutes as needed for chest pain. (Patient not taking: Reported on 05/24/2022) 25 tablet 6   No facility-administered medications prior to visit.    Allergies  Allergen Reactions   Atorvastatin     constipated   Latex Hives    Allergic reaction to condoms years ago.    ROS     Objective:    Physical Exam Constitutional:      General: She is not in acute distress.    Appearance: Normal appearance. She is well-developed.  HENT:     Head: Normocephalic and atraumatic.     Right Ear: External ear normal.     Left Ear: External ear normal.  Eyes:     General: No scleral icterus. Neck:     Thyroid: No thyromegaly.  Cardiovascular:     Rate and Rhythm: Normal rate and regular rhythm.     Heart sounds: Normal heart sounds. No murmur heard. Pulmonary:     Effort: Pulmonary effort is normal. No respiratory distress.     Breath sounds: Normal breath sounds. No wheezing.  Musculoskeletal:     Cervical back: Neck supple.  Skin:    General: Skin is warm and dry.  Neurological:     Mental Status: She is alert and oriented to person, place, and time.  Psychiatric:        Mood and Affect: Mood normal.        Behavior: Behavior normal.        Thought Content: Thought content normal.  Judgment: Judgment normal.      BP 101/85 (BP Location: Right Arm, Patient Position: Sitting, Cuff Size: Large)   Pulse 86   Ht 5' 3.25" (1.607 m)   Wt 225 lb 3.2 oz (102.2 kg)   SpO2 100%   BMI 39.58 kg/m  Wt Readings from Last 3 Encounters:  10/03/22 225 lb 3.2 oz (102.2 kg)  09/21/22 224 lb (101.6 kg)  08/03/22 238 lb 1.9 oz (108 kg)       Assessment & Plan:   Problem List Items Addressed This Visit       Unprioritized   Tobacco use    Patient continues to smoke 2-3 cigarettes per day. Discussed various cessation methods, but patient prefers to  attempt quitting "cold [place]." -Encouraged to continue efforts to quit smoking.      Lower extremity edema    Patient takes triamterene hydrochlorothiazide as needed for foot swelling. -Continue current management.      Loose stools - Primary    Recent change in stool consistency, likely due to dietary changes or a minor gastrointestinal illness. No other symptoms reported.  -Advised to monitor stool consistency and report if it does not return to normal in a few days.      Hx of breast cancer    S/p mastectomy, chemo, maintained on Femara.  Management per oncology.       Patient has not received tetanus or shingles vaccines. -Advised to receive tetanus and shingles vaccines at pharmacy.  I am having Jaime [name] maintain her latanoprost, ergocalciferol, nitroGLYCERIN, aspirin EC, ibuprofen, letrozole, and triamterene-hydrochlorothiazide.  No orders of the defined types were placed in this encounter.  30 minutes spent on today's visit. Time was spent counseling patient on immunizations, diarrhea, tobacco cessation and reviewing medical record.

## 2022-10-03 NOTE — Assessment & Plan Note (Signed)
Patient takes triamterene hydrochlorothiazide as needed for foot swelling. -Continue current management.

## 2022-10-04 ENCOUNTER — Other Ambulatory Visit: Payer: Self-pay

## 2022-10-14 ENCOUNTER — Other Ambulatory Visit (HOSPITAL_COMMUNITY)
Admission: RE | Admit: 2022-10-14 | Discharge: 2022-10-14 | Disposition: A | Discharge status: Home / Self Care | Payer: Medicare Other | Source: Ambulatory Visit | Attending: Family | Admitting: Family

## 2022-10-14 ENCOUNTER — Ambulatory Visit (INDEPENDENT_AMBULATORY_CARE_PROVIDER_SITE_OTHER): Payer: Medicare Other | Admitting: Family

## 2022-10-14 VITALS — BP 145/66 | HR 80 | Temp 98.6°F | Resp 16 | Wt 229.0 lb

## 2022-10-14 DIAGNOSIS — R739 Hyperglycemia, unspecified: Secondary | ICD-10-CM | POA: Insufficient documentation

## 2022-10-14 DIAGNOSIS — N760 Acute vaginitis: Secondary | ICD-10-CM | POA: Insufficient documentation

## 2022-10-14 DIAGNOSIS — I10 Essential (primary) hypertension: Secondary | ICD-10-CM

## 2022-10-14 HISTORY — DX: Essential (primary) hypertension: I10

## 2022-10-14 HISTORY — DX: Hyperglycemia, unspecified: R73.9

## 2022-10-14 HISTORY — DX: Acute vaginitis: N76.0

## 2022-10-14 MED ORDER — FLUCONAZOLE 150 MG PO TABS: ORAL_TABLET | ORAL | 0 refills | Status: DC

## 2022-10-14 NOTE — Assessment & Plan Note (Signed)
Noted on lab work.  Mild. Check A1C.

## 2022-10-14 NOTE — Assessment & Plan Note (Addendum)
New. She would like STD testing. Swab obtained. Suspect yeast, rx sent for diflucan while we wait on results.

## 2022-10-14 NOTE — Progress Notes (Signed)
Subjective:     Patient ID: Jaime [name], female    DOB: Mar 07, 1961, 62 y.o.   MRN: [id number]  Chief Complaint  Patient presents with   Vaginal Discharge    Complains of vaginal discharge x5 days with some vaginal itching.    HPI  Discussed the use of AI scribe software for clinical note transcription with the patient, who gave verbal consent to proceed.  History of Present Illness         BP Readings from Last 3 Encounters:  10/14/22 (!) 145/66  10/03/22 101/85  09/21/22 (!) 142/63    Patient presents today with c/o vaginal itching and discharge.   She would like STD testing but notes she has not had any new partners.      Health Maintenance Due  Topic Date Due   DTaP/Tdap/Td (1 - Tdap) Never done   Zoster Vaccines- Shingrix (1 of 2) Never done   COVID-19 Vaccine (3 - Pfizer risk series) 08/26/2019    Past Medical History:  Diagnosis Date   Aortic atherosclerosis (HCC) 03/13/2020   Atypical chest pain 02/13/2019   Breast cancer (HCC)    BIL  mastectomy left    lumpectomy right   DOE (dyspnea on exertion) 12/29/2021   Emphysema lung (HCC) 03/13/2020   Emphysema lung (HCC)    Glaucoma    Heart murmur    ECHO scheduled 03/04/22   History of alcohol abuse    no drinking since 1999   History of drug abuse (HCC)    1996- last drug use- been clean since then   Hx of breast cancer 02/13/2019   Right (2004) pT1cN1 cM0 triple negative s/p lumpectomy and ALND with adjuvant ddAc-T and radiation Left (2018) - Stage IIA cT2N0M0 ER-/PR-/Her2+ and Stage IA CT1CN0 ER-/PR-/Her2- both ypT1aN0(sn) grade 3 - s/p 6C neoadjuvant chemo, and mastectomy and SLNB on 09/15/2017 (records scanned)   Lung mass 02/13/2019   Open-angle glaucoma 02/13/2019   Personal history of chemotherapy    Personal history of radiation therapy    Personal history of tobacco use, presenting hazards to health 03/12/2019   Right thyroid nodule    S/P left mastectomy 02/13/2019   Sleep apnea     mild per pt, no CPAP recommended   Tobacco use 02/13/2019    Past Surgical History:  Procedure Laterality Date   BREAST BIOPSY Left 2019   BREAST BIOPSY Right 01/31/2022   MM RT BREAST BX W LOC DEV 1ST LESION IMAGE BX SPEC STEREO GUIDE 01/31/2022 [facility]   BREAST LUMPECTOMY Right 2004   triple Neg breast CA   DILATION AND CURETTAGE OF UTERUS  1985   MASTECTOMY Left 08/2017   TOTAL MASTECTOMY Right 03/08/2022   Procedure: RIGHT  MASTECTOMY;  Surgeon: Manus Rudd, MD;  Location: [facility];  Service: General;  Laterality: Right;  PEC BLOCK   TUBAL LIGATION  1991   UNILATERAL SALPINGECTOMY  1985    Family History  Problem Relation Age of Onset   Alcohol abuse Mother    Lung cancer Mother    Esophageal cancer Mother    Throat cancer Mother    Alcohol abuse Father    Arthritis Father    Kidney disease Father    Dementia Father    Arthritis Sister    Diabetes Sister    Arthritis Brother    Hypertension Brother    Sickle cell anemia Brother    HIV Brother    Heart attack Brother 50  massive MI   Thyroid disease Brother     Social History   Socioeconomic History   Marital status: Married    Spouse name: Not on file   Number of children: 2   Years of education: associates   Highest education level: Associate degree: academic program  Occupational History   Not on file  Tobacco Use   Smoking status: Every Day    Current packs/day: 0.25    Average packs/day: 0.3 packs/day for 42.0 years (10.5 ttl pk-yrs)    Types: Cigarettes   Smokeless tobacco: Never  Vaping Use   Vaping status: Some Days   Substances: Nicotine  Substance and Sexual Activity   Alcohol use: Not Currently    Comment: quit in 1999   Drug use: Not Currently    Comment: quit in 1996-street drugs (coccaine, marijuana)   Sexual activity: Not Currently  Other Topics Concern   Not on file  Social History Narrative   02/13/19   From: Born in [place] but grew up in [place]   Living:  with husband   Work: currently on disability      Family: Daughter - Social worker (biological daughter) Son - Jaime [name] (biological son) has one step son       Enjoys: walking, picnics, bowling, reading, playing cards      Exercise: walking - tries to do everyday - uses fitbit and tries to get 5000 steps   Diet: veggies, water, 2-3 cups of coffee, trying to monitor what she eats      Safety   Seat belts: Yes    Guns: No   Safe in relationships: Yes    Social Determinants of Health   Financial Resource Strain: Low Risk  (09/26/2022)   Overall Financial Resource Strain (CARDIA)    Difficulty of Paying Living Expenses: Not very hard  Recent Concern: Financial Resource Strain - Medium Risk (07/07/2022)   Overall Financial Resource Strain (CARDIA)    Difficulty of Paying Living Expenses: Somewhat hard  Food Insecurity: No Food Insecurity (09/26/2022)   Hunger Vital Sign    Worried About Running Out of Food in the Last Year: Never true    Ran Out of Food in the Last Year: Never true  Recent Concern: Food Insecurity - Food Insecurity Present (07/07/2022)   Hunger Vital Sign    Worried About Running Out of Food in the Last Year: Sometimes true    Ran Out of Food in the Last Year: Sometimes true  Transportation Needs: No Transportation Needs (09/26/2022)   PRAPARE - Administrator, Civil Service (Medical): No    Lack of Transportation (Non-Medical): No  Physical Activity: Inactive (07/07/2022)   Exercise Vital Sign    Days of Exercise per Week: 0 days    Minutes of Exercise per Session: 0 min  Stress: No Stress Concern Present (09/26/2022)   [employer] of Occupational Health - Occupational Stress Questionnaire    Feeling of Stress : Not at all  Social Connections: Unknown (09/26/2022)   Social Connection and Isolation Panel [NHANES]    Frequency of Communication with Friends and Family: More than three times a week    Frequency of Social Gatherings with Friends and Family: Three times a  week    Attends Religious Services: Patient declined    Active Member of Clubs or Organizations: No    Attends Engineer, structural: More than 4 times per year    Marital Status: Married  Catering manager Violence: Not At Risk (  07/13/2022)   Humiliation, Afraid, Rape, and Kick questionnaire    Fear of Current or Ex-Partner: No    Emotionally Abused: No    Physically Abused: No    Sexually Abused: No    Outpatient Medications Prior to Visit  Medication Sig Dispense Refill   aspirin EC 81 MG tablet Take 81 mg by mouth daily. Swallow whole.     ergocalciferol (VITAMIN D2) 1.25 MG (50000 UT) capsule Take 1 capsule (50,000 Units total) by mouth once a week. 8 capsule 3   ibuprofen (ADVIL) 200 MG tablet Take 400 mg by mouth every 6 (six) hours as needed for moderate pain.     latanoprost (XALATAN) 0.005 % ophthalmic solution Place 1 drop into both eyes daily.     letrozole (FEMARA) 2.5 MG tablet Take 1 tablet (2.5 mg total) by mouth daily. 30 tablet 12   nitroGLYCERIN (NITROSTAT) 0.4 MG SL tablet Place 1 tablet (0.4 mg total) under the tongue every 5 (five) minutes as needed for chest pain. 25 tablet 6   triamterene-hydrochlorothiazide (MAXZIDE-25) 37.5-25 MG tablet Take 1 tablet by mouth daily. Take daily for 3 days in a row and then take daily as needed for leg swelling 30 tablet 3   No facility-administered medications prior to visit.    Allergies  Allergen Reactions   Atorvastatin     constipated   Latex Hives    Allergic reaction to condoms years ago.    ROS    See HPI Objective:    Physical Exam Exam conducted with a chaperone present.  Constitutional:      Appearance: Normal appearance.  Cardiovascular:     Rate and Rhythm: Normal rate.  Pulmonary:     Effort: Pulmonary effort is normal.  Genitourinary:    General: Normal vulva.     Vagina: No vaginal discharge (no obvious discharge).  Skin:    General: Skin is warm and dry.  Neurological:     Mental  Status: She is alert.      BP (!) 145/66   Pulse 80   Temp 98.6 F (37 C) (Oral)   Resp 16   Wt 229 lb (103.9 kg)   SpO2 100%   BMI 40.25 kg/m  Wt Readings from Last 3 Encounters:  10/14/22 229 lb (103.9 kg)  10/03/22 225 lb 3.2 oz (102.2 kg)  09/21/22 224 lb (101.6 kg)       Assessment & Plan:   Problem List Items Addressed This Visit       Unprioritized   Hypertension    BP Readings from Last 3 Encounters:  10/14/22 (!) 145/66  10/03/22 101/85  09/21/22 (!) 142/63   BP mildly elevated but looked great a few weeks ago.  Recommended a low sodium diet. Will repeat bp in 3 months.       Hyperglycemia - Primary    Noted on lab work.  Mild. Check A1C.       Relevant Orders   HgB A1c   Acute vaginitis    New. She would like STD testing. Swab obtained. Suspect yeast, rx sent for diflucan while we wait on results.       Relevant Orders   Urine cytology ancillary only([place])    I am having Naomi C. Natividad start on fluconazole. I am also having her maintain her latanoprost, ergocalciferol, nitroGLYCERIN, aspirin EC, ibuprofen, letrozole, and triamterene-hydrochlorothiazide.  Meds ordered this encounter  Medications   fluconazole (DIFLUCAN) 150 MG tablet    Sig: Take  1 tablet by mouth today, the repeat in 3 days    Dispense:  2 tablet    Refill:  0    Order Specific Question:   Supervising Provider    Answer:   Danise Edge A [[id number]]

## 2022-10-14 NOTE — Assessment & Plan Note (Signed)
BP Readings from Last 3 Encounters:  10/14/22 (!) 145/66  10/03/22 101/85  09/21/22 (!) 142/63   BP mildly elevated but looked great a few weeks ago.  Recommended a low sodium diet. Will repeat bp in 3 months.

## 2022-10-17 ENCOUNTER — Telehealth: Payer: Self-pay | Admitting: Family

## 2022-10-17 LAB — URINE CYTOLOGY ANCILLARY ONLY
Candida Glabrata: NEGATIVE
Candida Vaginitis: NEGATIVE
Chlamydia: NEGATIVE
Comment: NEGATIVE
Comment: NEGATIVE
Comment: NEGATIVE
Comment: NEGATIVE
Comment: NORMAL
Neisseria Gonorrhea: NEGATIVE
Trichomonas: POSITIVE — AB

## 2022-10-17 MED ORDER — METRONIDAZOLE 500 MG PO TABS: 500.0000 mg | ORAL_TABLET | Freq: Two times a day (BID) | ORAL | 0 refills | Status: AC

## 2022-10-17 NOTE — Telephone Encounter (Signed)
Please advise pt that her lab work shows trichomonas.  This is sexually transmitted.  I would like to treat her with metronidazole.  Do  not drink alcohol while on this medication.  Partner needs to complete treatment as well prior to future sexual contact.

## 2022-10-18 NOTE — Telephone Encounter (Signed)
Called patient but no answer, left voice mail for patient to call back.   

## 2022-10-18 NOTE — Telephone Encounter (Signed)
Patient called [place] back and was informed of results and provider's recomendations

## 2022-10-29 ENCOUNTER — Other Ambulatory Visit: Payer: Self-pay | Admitting: Hematology & Oncology

## 2022-11-02 ENCOUNTER — Inpatient Hospital Stay: Payer: Medicare Other

## 2022-11-04 ENCOUNTER — Telehealth: Payer: Self-pay | Admitting: Family

## 2022-11-04 NOTE — Telephone Encounter (Signed)
Medication: ergocalciferol (VITAMIN D2) 1.25 MG (50000 UT) capsule  Has the patient contacted their pharmacy? Yes.     Preferred Pharmacy:   [facility]  [address] [place], [place], [place] [address]

## 2022-11-22 DIAGNOSIS — G4733 Obstructive sleep apnea (adult) (pediatric): Secondary | ICD-10-CM | POA: Insufficient documentation

## 2022-11-22 DIAGNOSIS — E041 Nontoxic single thyroid nodule: Secondary | ICD-10-CM | POA: Insufficient documentation

## 2022-11-22 DIAGNOSIS — G4730 Sleep apnea, unspecified: Secondary | ICD-10-CM | POA: Insufficient documentation

## 2022-11-24 ENCOUNTER — Encounter: Payer: Self-pay | Admitting: Cardiology

## 2022-11-24 ENCOUNTER — Ambulatory Visit: Payer: Medicare Other | Attending: Cardiology | Admitting: Cardiology

## 2022-11-24 VITALS — BP 140/80 | HR 78 | Ht 64.0 in | Wt 230.0 lb

## 2022-11-24 DIAGNOSIS — J431 Panlobular emphysema: Secondary | ICD-10-CM | POA: Diagnosis present

## 2022-11-24 DIAGNOSIS — R0609 Other forms of dyspnea: Secondary | ICD-10-CM | POA: Insufficient documentation

## 2022-11-24 DIAGNOSIS — G4730 Sleep apnea, unspecified: Secondary | ICD-10-CM | POA: Insufficient documentation

## 2022-11-24 DIAGNOSIS — F17210 Nicotine dependence, cigarettes, uncomplicated: Secondary | ICD-10-CM | POA: Insufficient documentation

## 2022-11-24 DIAGNOSIS — I10 Essential (primary) hypertension: Secondary | ICD-10-CM

## 2022-11-24 DIAGNOSIS — E782 Mixed hyperlipidemia: Secondary | ICD-10-CM

## 2022-11-24 DIAGNOSIS — Z720 Tobacco use: Secondary | ICD-10-CM

## 2022-11-24 NOTE — Progress Notes (Signed)
Cardiology Office Note:    Date:  11/24/2022   ID:  Jaime [name], DOB February 18, 1961, MRN [id number]  PCP:  Sandford Craze, NP  Cardiologist:  Garwin Brothers, MD   Referring MD: Sandford Craze, NP    ASSESSMENT:    1. Primary hypertension   2. Panlobular emphysema (HCC)   3. Sleep apnea, unspecified type   4. DOE (dyspnea on exertion)   5. Tobacco use   6. Mixed dyslipidemia   7. Morbid obesity (HCC)    PLAN:    In order of problems listed above:  Primary prevention stressed with the patient.  Importance of compliance with diet medication stressed and patient verbalized standing.  She was advised to walk at least half an hour a day 5 days a week and she promises to do so. Essential hypertension: Blood pressure stable and diet was emphasized.  Lifestyle modification urged.  Her blood pressures at home are much better.  She has an element of whitecoat hypertension.  She mentioned to me the readings. Mixed dyslipidemia: Mild in nature and diet was emphasized. Obesity: Weight reduction stressed risks of obesity explained and she promises to do better. Cigarette smoker: I spent 5 minutes with the patient discussing solely about smoking. Smoking cessation was counseled. I suggested to the patient also different medications and pharmacological interventions. Patient is keen to try stopping on its own at this time. He will get back to me if he needs any further assistance in this matter. Patient will be seen in follow-up appointment in 6 months or earlier if the patient has any concerns.    Medication Adjustments/Labs and Tests Ordered: Current medicines are reviewed at length with the patient today.  Concerns regarding medicines are outlined above.  No orders of the defined types were placed in this encounter.  No orders of the defined types were placed in this encounter.    No chief complaint on file.    History of Present Illness:    Jaime [name] is a  62 y.o. female.  Patient has past medical history of essential hypertension, mixed dyslipidemia, obesity and breast cancer 3 times.  She continues to smoke unfortunately.  She denies any chest pain orthopnea or PND.  She has significant shortness of breath on exertion.  At the time of my evaluation, the patient is alert awake oriented and in no distress.  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  The report date is March 02, 2022.     The CancerNext-Expanded gene panel offered by [facility] and includes sequencing and rearrangement analysis for the following 77 genes: AIP, ALK, APC*, ATM*, AXIN2, BAP1, BARD1, BLM, BMPR1A, BRCA1*, BRCA2*, BRIP1*, CDC73, CDH1*, CDK4, CDKN1B, CDKN2A, CHEK2*, CTNNA1, DICER1, FANCC, FH   Glaucoma    Heart murmur    ECHO scheduled 03/04/22   History of alcohol abuse    no drinking since 1999   History of drug abuse (HCC)    1996- last drug use- been clean since then   Hx of breast cancer 02/13/2019  Right (2004) pT1cN1 cM0 triple negative s/p lumpectomy and ALND with adjuvant ddAc-T and radiation Left (2018) - Stage IIA cT2N0M0 ER-/PR-/Her2+ and Stage IA CT1CN0 ER-/PR-/Her2- both ypT1aN0(sn) grade 3 - s/p 6C neoadjuvant chemo, and mastectomy and SLNB on 09/15/2017 (records scanned)   Hyperglycemia 10/14/2022   Hypertension 10/14/2022   Loose stools 10/03/2022   Lower extremity edema 10/03/2022   Lung mass 02/13/2019   Mixed dyslipidemia 02/03/2022   Open-angle glaucoma 02/13/2019   Personal history of chemotherapy    Personal  history of radiation therapy    Personal history of tobacco use, presenting hazards to health 03/12/2019   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) 03/08/2022   Right thyroid nodule    S/P left mastectomy 02/13/2019   Sleep apnea    mild per pt, no CPAP recommended   Surgical wound infection 04/01/2022   Tobacco use 02/13/2019    Past Surgical History:  Procedure Laterality Date   BREAST BIOPSY Left 2019   BREAST BIOPSY Right 01/31/2022   MM RT BREAST BX W LOC DEV 1ST LESION IMAGE BX SPEC STEREO GUIDE 01/31/2022 [facility]   BREAST LUMPECTOMY Right 2004   triple Neg breast CA   DILATION AND CURETTAGE OF UTERUS  1985   MASTECTOMY Left 08/2017   TOTAL MASTECTOMY Right 03/08/2022   Procedure: RIGHT  MASTECTOMY;  Surgeon: Manus Rudd, MD;  Location: [facility];  Service: General;  Laterality: Right;  PEC BLOCK   TUBAL LIGATION  1991   UNILATERAL SALPINGECTOMY  1985    Current Medications: Current Meds  Medication Sig   aspirin EC 81 MG tablet Take 81 mg by mouth daily. Swallow whole.   ergocalciferol (VITAMIN D2) 1.25 MG (50000 UT) capsule Take 1 capsule (50,000 Units total) by mouth once a week.   fluconazole (DIFLUCAN) 150 MG tablet Take 1 tablet by mouth today, the repeat in 3 days   ibuprofen (ADVIL) 200 MG tablet Take 400 mg by mouth every 6 (six) hours as needed for moderate pain.   latanoprost (XALATAN) 0.005 % ophthalmic solution Place 1 drop into both eyes daily.   letrozole (FEMARA) 2.5 MG tablet Take 1 tablet (2.5 mg total) by mouth daily.   nitroGLYCERIN (NITROSTAT) 0.4 MG SL tablet Place 1 tablet (0.4 mg total) under the tongue every 5 (five) minutes as needed for chest pain.   triamterene-hydrochlorothiazide (MAXZIDE-25) 37.5-25 MG tablet TAKE 1 TABLET BY MOUTH DAILY. TAKE DAILY FOR 3 DAYS IN A ROW AND THEN TAKE DAILY AS NEEDED FOR LEG SWELLING     Allergies:   Atorvastatin and Latex   Social History   Socioeconomic History   Marital  status: Married    Spouse name: Not on file   Number of children: 2   Years of education: associates   Highest education level: Associate degree: academic program  Occupational History   Not on file  Tobacco Use   Smoking status: Every Day    Current packs/day: 0.25    Average packs/day: 0.3 packs/day for 42.0 years (10.5 ttl pk-yrs)    Types: Cigarettes   Smokeless tobacco: Never  Vaping Use   Vaping status: Some Days   Substances: Nicotine  Substance and Sexual Activity   Alcohol use: Not Currently    Comment: quit in 1999   Drug use: Not Currently    Comment: quit in 1996-street drugs (coccaine, marijuana)   Sexual activity: Not Currently  Other Topics Concern   Not on file  Social History Narrative  02/13/19   From: Born in [place] but grew up in [place]   Living: with husband   Work: currently on disability      Family: Daughter - Social worker (biological daughter) Son - [name] (biological son) has one step son       Enjoys: walking, picnics, bowling, reading, playing cards      Exercise: walking - tries to do everyday - uses fitbit and tries to get 5000 steps   Diet: veggies, water, 2-3 cups of coffee, trying to monitor what she eats      Safety   Seat belts: Yes    Guns: No   Safe in relationships: Yes    Social Determinants of Health   Financial Resource Strain: Low Risk  (09/26/2022)   Overall Financial Resource Strain (CARDIA)    Difficulty of Paying Living Expenses: Not very hard  Recent Concern: Financial Resource Strain - Medium Risk (07/07/2022)   Overall Financial Resource Strain (CARDIA)    Difficulty of Paying Living Expenses: Somewhat hard  Food Insecurity: No Food Insecurity (09/26/2022)   Hunger Vital Sign    Worried About Running Out of Food in the Last Year: Never true    Ran Out of Food in the Last Year: Never true  Recent Concern: Food Insecurity - Food Insecurity Present (07/07/2022)   Hunger Vital Sign    Worried About Running Out of Food in  the Last Year: Sometimes true    Ran Out of Food in the Last Year: Sometimes true  Transportation Needs: No Transportation Needs (09/26/2022)   PRAPARE - Administrator, Civil Service (Medical): No    Lack of Transportation (Non-Medical): No  Physical Activity: Inactive (07/07/2022)   Exercise Vital Sign    Days of Exercise per Week: 0 days    Minutes of Exercise per Session: 0 min  Stress: No Stress Concern Present (09/26/2022)   [employer] of Occupational Health - Occupational Stress Questionnaire    Feeling of Stress : Not at all  Social Connections: Unknown (09/26/2022)   Social Connection and Isolation Panel [NHANES]    Frequency of Communication with Friends and Family: More than three times a week    Frequency of Social Gatherings with Friends and Family: Three times a week    Attends Religious Services: Patient declined    Active Member of Clubs or Organizations: No    Attends Engineer, structural: More than 4 times per year    Marital Status: Married     Family History: The patient's family history includes Alcohol abuse in her father and mother; Arthritis in her brother, father, and sister; Dementia in her father; Diabetes in her sister; Esophageal cancer in her mother; HIV in her brother; Heart attack (age of onset: 85) in her brother; Hypertension in her brother; Kidney disease in her father; Lung cancer in her mother; Sickle cell anemia in her brother; Throat cancer in her mother; Thyroid disease in her brother.  ROS:   Please see the history of present illness.    All other systems reviewed and are negative.  EKGs/Labs/Other Studies Reviewed:    The following studies were reviewed today: I discussed my findings with the patient at length.   Recent Labs: 09/21/2022: ALT 7; BUN 16; Creatinine 0.66; Hemoglobin 12.3; Platelet Count 192; Potassium 4.2; Sodium 141  Recent Lipid Panel    Component Value Date/Time   CHOL 198 06/09/2021 0948   TRIG  64.0 06/09/2021 0948   HDL 74.10 06/09/2021 0948  CHOLHDL 3 06/09/2021 0948   VLDL 12.8 06/09/2021 0948   LDLCALC 112 (H) 06/09/2021 0948    Physical Exam:    VS:  BP (!) 140/80   Pulse 78   Ht 5\' 4"  (1.626 m)   Wt 230 lb (104.3 kg)   SpO2 98%   BMI 39.48 kg/m     Wt Readings from Last 3 Encounters:  11/24/22 230 lb (104.3 kg)  10/14/22 229 lb (103.9 kg)  10/03/22 225 lb 3.2 oz (102.2 kg)     GEN: Patient is in no acute distress HEENT: Normal NECK: No JVD; No carotid bruits LYMPHATICS: No lymphadenopathy CARDIAC: Hear sounds regular, 2/6 systolic murmur at the apex. RESPIRATORY:  Clear to auscultation without rales, wheezing or rhonchi  ABDOMEN: Soft, non-tender, non-distended MUSCULOSKELETAL:  No edema; No deformity  SKIN: Warm and dry NEUROLOGIC:  Alert and oriented x 3 PSYCHIATRIC:  Normal affect   Signed, Garwin Brothers, MD  11/24/2022 9:45 AM    [facility]

## 2022-11-24 NOTE — Patient Instructions (Signed)

## 2022-12-22 ENCOUNTER — Encounter: Payer: Self-pay | Admitting: Hematology & Oncology

## 2022-12-22 ENCOUNTER — Other Ambulatory Visit: Payer: Medicare Other

## 2022-12-22 ENCOUNTER — Inpatient Hospital Stay (HOSPITAL_BASED_OUTPATIENT_CLINIC_OR_DEPARTMENT_OTHER): Payer: Medicare Other | Admitting: Hematology & Oncology

## 2022-12-22 ENCOUNTER — Inpatient Hospital Stay: Payer: Medicare Other | Attending: Hematology & Oncology

## 2022-12-22 ENCOUNTER — Other Ambulatory Visit: Payer: Self-pay | Admitting: *Deleted

## 2022-12-22 ENCOUNTER — Other Ambulatory Visit: Payer: Self-pay

## 2022-12-22 VITALS — BP 134/51 | HR 72 | Temp 98.0°F | Resp 18 | Ht 63.25 in | Wt 225.0 lb

## 2022-12-22 DIAGNOSIS — C50011 Malignant neoplasm of nipple and areola, right female breast: Secondary | ICD-10-CM

## 2022-12-22 DIAGNOSIS — Z79811 Long term (current) use of aromatase inhibitors: Secondary | ICD-10-CM | POA: Insufficient documentation

## 2022-12-22 DIAGNOSIS — Z853 Personal history of malignant neoplasm of breast: Secondary | ICD-10-CM

## 2022-12-22 DIAGNOSIS — C50911 Malignant neoplasm of unspecified site of right female breast: Secondary | ICD-10-CM

## 2022-12-22 DIAGNOSIS — C50919 Malignant neoplasm of unspecified site of unspecified female breast: Secondary | ICD-10-CM

## 2022-12-22 DIAGNOSIS — Z9221 Personal history of antineoplastic chemotherapy: Secondary | ICD-10-CM | POA: Insufficient documentation

## 2022-12-22 DIAGNOSIS — Z170 Estrogen receptor positive status [ER+]: Secondary | ICD-10-CM | POA: Diagnosis not present

## 2022-12-22 DIAGNOSIS — E559 Vitamin D deficiency, unspecified: Secondary | ICD-10-CM

## 2022-12-22 DIAGNOSIS — Z9011 Acquired absence of right breast and nipple: Secondary | ICD-10-CM | POA: Insufficient documentation

## 2022-12-22 LAB — CMP (CANCER CENTER ONLY)
ALT: 8 U/L (ref 0–44)
AST: 14 U/L — ABNORMAL LOW (ref 15–41)
Albumin: 4 g/dL (ref 3.5–5.0)
Alkaline Phosphatase: 69 U/L (ref 38–126)
Anion gap: 7 (ref 5–15)
BUN: 13 mg/dL (ref 8–23)
CO2: 29 mmol/L (ref 22–32)
Calcium: 9.1 mg/dL (ref 8.9–10.3)
Chloride: 103 mmol/L (ref 98–111)
Creatinine: 0.69 mg/dL (ref 0.44–1.00)
GFR, Estimated: >60 mL/min (ref >60)
Glucose, Bld: 87 mg/dL (ref 70–99)
Potassium: 3.7 mmol/L (ref 3.5–5.1)
Sodium: 139 mmol/L (ref 135–145)
Total Bilirubin: 0.6 mg/dL (ref 0.3–1.2)
Total Protein: 6.6 g/dL (ref 6.5–8.1)

## 2022-12-22 LAB — CBC WITH DIFFERENTIAL (CANCER CENTER ONLY)
Abs Immature Granulocytes: 0.01 10*3/uL (ref 0.00–0.07)
Basophils Absolute: 0 10*3/uL (ref 0.0–0.1)
Basophils Relative: 1 %
Eosinophils Absolute: 0.1 10*3/uL (ref 0.0–0.5)
Eosinophils Relative: 3 %
HCT: 38.9 % (ref 36.0–46.0)
Hemoglobin: 12.4 g/dL (ref 12.0–15.0)
Immature Granulocytes: 0 %
Lymphocytes Relative: 59 %
Lymphs Abs: 1.9 10*3/uL (ref 0.7–4.0)
MCH: 28.7 pg (ref 26.0–34.0)
MCHC: 31.9 g/dL (ref 30.0–36.0)
MCV: 90 fL (ref 80.0–100.0)
Monocytes Absolute: 0.4 10*3/uL (ref 0.1–1.0)
Monocytes Relative: 11 %
Neutro Abs: 0.8 10*3/uL — ABNORMAL LOW (ref 1.7–7.7)
Neutrophils Relative %: 26 %
Platelet Count: 238 10*3/uL (ref 150–400)
RBC: 4.32 MIL/uL (ref 3.87–5.11)
RDW: 13.8 % (ref 11.5–15.5)
WBC Count: 3.2 10*3/uL — ABNORMAL LOW (ref 4.0–10.5)
nRBC: 0 % (ref 0.0–0.2)

## 2022-12-22 LAB — LACTATE DEHYDROGENASE: LDH: 167 U/L (ref 98–192)

## 2022-12-22 LAB — VITAMIN D 25 HYDROXY (VIT D DEFICIENCY, FRACTURES): Vit D, 25-Hydroxy: 40.47 ng/mL (ref 30–100)

## 2022-12-22 NOTE — Progress Notes (Signed)
[facility] Hematology and Oncology Follow Up Visit  Jaime [name] [id number] Jan 07, 1961 62 y.o. 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 daily-start 7 years on 08/03/2022 Zometa 4 mg IV every 6 months --next dose in 06/2023     Interim History:  Jaime [name] is back for follow-up.  Overall, she doing quite nicely.  She was no complaints.  She is doing well with the Femara.  She says that her thumbs do bother her a little bit.  She is taking some vitamin D..  She has had no cough.  Has been no shortness of breath.  She has had no nausea or vomiting.  She has had no change in bowel or bladder habits.  There is been no bleeding.  She has had no leg swelling.  There is been no rashes.  She has had no headache.  She we will get Zometa in about a week or so.  She cannot have this today as she has to help her brother go to speech therapy.  Overall, I would have said that her performance status is probably [name].    Medications:  Current Outpatient Medications:    aspirin EC 81 MG tablet, Take 81 mg by mouth daily. Swallow whole., Disp: , Rfl:    ergocalciferol (VITAMIN D2) 1.25 MG (50000 UT) capsule, Take 1 capsule (50,000 Units total) by mouth once a week., Disp: 8 capsule, Rfl: 3   fluconazole (DIFLUCAN) 150 MG tablet, Take 1 tablet by mouth today, the repeat in 3 days, Disp: 2 tablet, Rfl: 0   ibuprofen (ADVIL) 200 MG tablet, Take 400 mg by mouth every 6 (six) hours as needed for moderate pain., Disp: , Rfl:    latanoprost (XALATAN) 0.005 % ophthalmic solution, Place 1 drop into both eyes daily., Disp: , Rfl:    letrozole (FEMARA) 2.5 MG tablet, Take 1 tablet (2.5 mg total) by mouth daily., Disp: 30 tablet,  Rfl: 12   nitroGLYCERIN (NITROSTAT) 0.4 MG SL tablet, Place 1 tablet (0.4 mg total) under the tongue every 5 (five) minutes as needed for chest pain., Disp: 25 tablet, Rfl: 6   triamterene-hydrochlorothiazide (MAXZIDE-25) 37.5-25 MG tablet, TAKE 1 TABLET BY MOUTH DAILY. TAKE DAILY FOR 3 DAYS IN A ROW AND THEN TAKE DAILY AS NEEDED FOR LEG SWELLING, Disp: 90 tablet, Rfl: 1  Allergies:  Allergies  Allergen Reactions   Atorvastatin     constipated   Latex Hives    Allergic reaction to condoms years ago.    Past Medical History, Surgical history, Social history, and Family History were reviewed and updated.  Review of Systems: Review of Systems  Constitutional: Negative.   HENT:  Negative.    Eyes: Negative.   Respiratory: Negative.    Cardiovascular: Negative.   Gastrointestinal: Negative.   Endocrine: Negative.   Genitourinary: Negative.    Musculoskeletal: Negative.   Skin: Negative.   Neurological: Negative.   Hematological: Negative.   Psychiatric/Behavioral: Negative.      Physical Exam:  height is 5' 3.25" (1.607 m) and weight is 225 lb (102.1 kg). Her oral temperature is 98 F (36.7 C). Her blood pressure is 134/51 (abnormal) and her pulse is 72. Her respiration is 18 and oxygen saturation is 100%.  Wt Readings from Last 3 Encounters:  12/22/22 225 lb (102.1 kg)  11/24/22 230 lb (104.3 kg)  10/14/22 229 lb (103.9 kg)    Physical Exam Vitals reviewed.  Constitutional:      Comments: Chest wall exam shows left chest wall with mastectomy.  She has no chest wall nodules.  There is no left axillary adenopathy.  Right chest wall shows the healing mastectomy.  She has little bit of fluctuance in the lateral aspect of the mastectomy.  It is nontender.  There is some hyperpigmentation from past radiation.  There is no nipple discharge.  There is no erythema.    There is no right axillary adenopathy.  HENT:     Head: Normocephalic and atraumatic.  Eyes:     Pupils: Pupils  are equal, round, and reactive to light.  Cardiovascular:     Rate and Rhythm: Normal rate and regular rhythm.     Heart sounds: Normal heart sounds.  Pulmonary:     Effort: Pulmonary effort is normal.     Breath sounds: Normal breath sounds.  Abdominal:     General: Bowel sounds are normal.     Palpations: Abdomen is soft.  Musculoskeletal:        General: No tenderness or deformity. Normal range of motion.     Cervical back: Normal range of motion.  Lymphadenopathy:     Cervical: No cervical adenopathy.  Skin:    General: Skin is warm and dry.     Findings: No erythema or rash.  Neurological:     Mental Status: She is alert and oriented to person, place, and time.  Psychiatric:        Behavior: Behavior normal.        Thought Content: Thought content normal.        Judgment: Judgment normal.      Lab Results  Component Value Date   WBC 3.2 (L) 12/22/2022   HGB 12.4 12/22/2022   HCT 38.9 12/22/2022   MCV 90.0 12/22/2022   PLT 238 12/22/2022     Chemistry      Component Value Date/Time   NA 139 12/22/2022 0859   NA 143 02/16/2022 1006   K 3.7 12/22/2022 0859   CL 103 12/22/2022 0859   CO2 29 12/22/2022 0859   BUN 13 12/22/2022 0859   BUN 13 02/16/2022 1006   CREATININE 0.69 12/22/2022 0859      Component Value Date/Time   CALCIUM 9.1 12/22/2022 0859   ALKPHOS 69 12/22/2022 0859   AST 14 (L) 12/22/2022 0859   ALT 8 12/22/2022 0859   BILITOT 0.6 12/22/2022 0859      Impression and Plan: Jaime [name] is a very charming 62 year old postmenopausal African-American female.  This is her third breast cancer.  I do not believe this is a recurrence.  I believe this is a new breast cancer.  I am not sure as to why she keeps having breast cancer.  I think she underwent genetic testing.  I think the genetic testing was negative.  She has completed her adjuvant chemotherapy.  We now have her on Femara.  She is doing well on the Femara.  Again I do think that Zometa  will be helpful.  We will go ahead and try to do this in about a week or so.  I would like to go ahead and get her back to see [place] in about 3 months.  If all looks good at that time, then we probably  will try to move her appointments out every 4 months.      Josph Macho, MD 9/26/20249:53 AM

## 2022-12-26 ENCOUNTER — Telehealth: Payer: Self-pay | Admitting: Family

## 2022-12-26 NOTE — Telephone Encounter (Signed)
Prescription Request  12/26/2022  Is this a "Controlled Substance" medicine? No  LOV: 10/14/2022  What is the name of the medication or equipment?   ergocalciferol (VITAMIN D2) 1.25 MG (50000 UT) capsule [[id number]]  Have you contacted your pharmacy to request a refill? No   Which pharmacy would you like this sent to?   [id number] [place], [place] - [address] [place] [address] Phone: [phone] Fax: [phone]  Patient notified that their request is being sent to the clinical staff for review and that they should receive a response within 2 business days.   Please advise at Mobile [phone] (mobile)

## 2022-12-27 ENCOUNTER — Telehealth: Payer: Self-pay

## 2022-12-27 DIAGNOSIS — Z95828 Presence of other vascular implants and grafts: Secondary | ICD-10-CM

## 2022-12-27 DIAGNOSIS — E559 Vitamin D deficiency, unspecified: Secondary | ICD-10-CM

## 2022-12-27 DIAGNOSIS — Z853 Personal history of malignant neoplasm of breast: Secondary | ICD-10-CM

## 2022-12-27 MED ORDER — VITAMIN D3 75 MCG (3000 UT) PO TABS
1.0000 | ORAL_TABLET | Freq: Every day | ORAL | Status: DC
Start: 2022-12-27 — End: 2023-12-26

## 2022-12-27 MED ORDER — ERGOCALCIFEROL 1.25 MG (50000 UT) PO CAPS
50000.0000 [IU] | ORAL_CAPSULE | ORAL | 3 refills | Status: DC
Start: 2022-12-27 — End: 2022-12-27

## 2022-12-27 NOTE — Addendum Note (Signed)
Addended by: Sandford Craze on: 12/27/2022 08:28 PM   Modules accepted: Orders

## 2022-12-27 NOTE — Telephone Encounter (Signed)
[facility] for Vitamin D2 1.25 MG once a week tablet # 8 with refills. Per Dr Myna Hidalgo ok to refill. ERX sent.

## 2022-12-27 NOTE — Telephone Encounter (Signed)
In place of vit D 50000 weekly, I would recommend otc vitamin D 3000 international units once daily.

## 2022-12-28 NOTE — Telephone Encounter (Signed)
Lvm for patient to call back in reference to this information

## 2022-12-30 NOTE — Telephone Encounter (Signed)
Patient notified of this results

## 2023-01-09 ENCOUNTER — Other Ambulatory Visit: Payer: Self-pay | Admitting: Family

## 2023-01-09 DIAGNOSIS — Z87891 Personal history of nicotine dependence: Secondary | ICD-10-CM

## 2023-01-09 DIAGNOSIS — Z720 Tobacco use: Secondary | ICD-10-CM

## 2023-01-27 ENCOUNTER — Telehealth (HOSPITAL_BASED_OUTPATIENT_CLINIC_OR_DEPARTMENT_OTHER): Payer: Self-pay

## 2023-02-13 ENCOUNTER — Ambulatory Visit (HOSPITAL_BASED_OUTPATIENT_CLINIC_OR_DEPARTMENT_OTHER)
Admission: RE | Admit: 2023-02-13 | Discharge: 2023-02-13 | Disposition: A | Discharge status: Home / Self Care | Payer: Medicare Other | Source: Ambulatory Visit | Attending: Family | Admitting: Family

## 2023-02-13 DIAGNOSIS — Z87891 Personal history of nicotine dependence: Secondary | ICD-10-CM | POA: Diagnosis present

## 2023-03-24 ENCOUNTER — Ambulatory Visit: Payer: Medicare Other

## 2023-03-24 ENCOUNTER — Other Ambulatory Visit: Payer: Self-pay | Admitting: Hematology & Oncology

## 2023-03-24 ENCOUNTER — Other Ambulatory Visit: Payer: Medicare Other

## 2023-03-24 ENCOUNTER — Encounter: Payer: Self-pay | Admitting: Family

## 2023-03-24 ENCOUNTER — Ambulatory Visit: Payer: Medicare Other | Admitting: Hematology & Oncology

## 2023-03-24 ENCOUNTER — Encounter: Payer: Self-pay | Admitting: Hematology & Oncology

## 2023-03-31 ENCOUNTER — Encounter: Payer: Self-pay | Admitting: Hematology & Oncology

## 2023-03-31 ENCOUNTER — Inpatient Hospital Stay: Payer: Medicare Other | Attending: Hematology & Oncology

## 2023-03-31 ENCOUNTER — Inpatient Hospital Stay (HOSPITAL_BASED_OUTPATIENT_CLINIC_OR_DEPARTMENT_OTHER): Payer: Medicare Other | Admitting: Hematology & Oncology

## 2023-03-31 ENCOUNTER — Ambulatory Visit: Payer: Medicare Other

## 2023-03-31 VITALS — BP 132/80 | HR 72 | Temp 98.4°F | Resp 20 | Ht 63.25 in | Wt 232.1 lb

## 2023-03-31 DIAGNOSIS — Z170 Estrogen receptor positive status [ER+]: Secondary | ICD-10-CM | POA: Insufficient documentation

## 2023-03-31 DIAGNOSIS — Z79899 Other long term (current) drug therapy: Secondary | ICD-10-CM | POA: Diagnosis not present

## 2023-03-31 DIAGNOSIS — Z79811 Long term (current) use of aromatase inhibitors: Secondary | ICD-10-CM | POA: Diagnosis not present

## 2023-03-31 DIAGNOSIS — C50011 Malignant neoplasm of nipple and areola, right female breast: Secondary | ICD-10-CM | POA: Diagnosis present

## 2023-03-31 DIAGNOSIS — Z1722 Progesterone receptor negative status: Secondary | ICD-10-CM | POA: Insufficient documentation

## 2023-03-31 DIAGNOSIS — C50911 Malignant neoplasm of unspecified site of right female breast: Secondary | ICD-10-CM

## 2023-03-31 DIAGNOSIS — Z1732 Human epidermal growth factor receptor 2 negative status: Secondary | ICD-10-CM | POA: Insufficient documentation

## 2023-03-31 LAB — CBC WITH DIFFERENTIAL (CANCER CENTER ONLY)
Abs Immature Granulocytes: 0 10*3/uL (ref 0.00–0.07)
Basophils Absolute: 0 10*3/uL (ref 0.0–0.1)
Basophils Relative: 1 %
Eosinophils Absolute: 0.2 10*3/uL (ref 0.0–0.5)
Eosinophils Relative: 4 %
HCT: 38.1 % (ref 36.0–46.0)
Hemoglobin: 12.6 g/dL (ref 12.0–15.0)
Immature Granulocytes: 0 %
Lymphocytes Relative: 65 %
Lymphs Abs: 2.5 10*3/uL (ref 0.7–4.0)
MCH: 29.2 pg (ref 26.0–34.0)
MCHC: 33.1 g/dL (ref 30.0–36.0)
MCV: 88.4 fL (ref 80.0–100.0)
Monocytes Absolute: 0.3 10*3/uL (ref 0.1–1.0)
Monocytes Relative: 9 %
Neutro Abs: 0.8 10*3/uL — ABNORMAL LOW (ref 1.7–7.7)
Neutrophils Relative %: 21 %
Platelet Count: 218 10*3/uL (ref 150–400)
RBC: 4.31 MIL/uL (ref 3.87–5.11)
RDW: 12.7 % (ref 11.5–15.5)
Smear Review: NORMAL
WBC Count: 3.8 10*3/uL — ABNORMAL LOW (ref 4.0–10.5)
WBC Morphology: >10
nRBC: 0 % (ref 0.0–0.2)

## 2023-03-31 LAB — CMP (CANCER CENTER ONLY)
ALT: 9 U/L (ref 0–44)
AST: 15 U/L (ref 15–41)
Albumin: 4.2 g/dL (ref 3.5–5.0)
Alkaline Phosphatase: 85 U/L (ref 38–126)
Anion gap: 7 (ref 5–15)
BUN: 16 mg/dL (ref 8–23)
CO2: 30 mmol/L (ref 22–32)
Calcium: 9.8 mg/dL (ref 8.9–10.3)
Chloride: 106 mmol/L (ref 98–111)
Creatinine: 0.73 mg/dL (ref 0.44–1.00)
GFR, Estimated: >60 mL/min (ref >60)
Glucose, Bld: 96 mg/dL (ref 70–99)
Potassium: 5.3 mmol/L — ABNORMAL HIGH (ref 3.5–5.1)
Sodium: 143 mmol/L (ref 135–145)
Total Bilirubin: 0.5 mg/dL (ref 0.0–1.2)
Total Protein: 7 g/dL (ref 6.5–8.1)

## 2023-03-31 LAB — LACTATE DEHYDROGENASE: LDH: 202 U/L — ABNORMAL HIGH (ref 98–192)

## 2023-03-31 NOTE — Progress Notes (Signed)
 [name] Hematology and Oncology Follow Up Visit  Jaime [name] [id number] 1960/08/24 63 y.o. 03/31/2023   Principle Diagnosis:  Stage IIa (T2N0M0) infiltrating ductal carcinoma of the right breast- ER+/PR-/HER2- --  Oncotype = 52  Current Therapy:   Status post right modified radical mastectomy on 03/08/2022 Taxotere /carboplatinum -s/p cycle 5/5 to start on 04/14/2022 -completed on 07/05/2022 Femara  2.5 mg p.o. daily-start 7 years on 08/03/2022     Interim History:  Jaime [name] is back for follow-up.  She is doing quite well.  We last saw her back in September.  Since then, she has been somewhat busy.  Unfortunately, an aunt passed away up in [place].  Jaime [name] and some of her family drove up there for the funeral.  This was last Saturday.  She is doing well on the Femara .  She has had no problems with arthralgias or myalgias.  There is been no change in bowel or bladder habits.  She has had no nausea or vomiting.  She has had little bit of a cough.  She said that she had a cold after she got back from Jaime [name].  Currently, I would have to say that she has a performance status of [name].   Medications:  Current Outpatient Medications:    aspirin EC 81 MG tablet, Take 81 mg by mouth daily. Swallow whole., Disp: , Rfl:    Cholecalciferol (VITAMIN D3) 75 MCG (3000 UT) TABS, Take 1 tablet by mouth daily at 6 (six) AM., Disp: , Rfl:    ibuprofen (ADVIL) 200 MG tablet, Take 400 mg by mouth every 6 (six) hours as needed for moderate pain., Disp: , Rfl:    latanoprost  (XALATAN ) 0.005 % ophthalmic solution, Place 1 drop into both eyes daily., Disp: , Rfl:    letrozole  (FEMARA ) 2.5 MG tablet, Take 1 tablet (2.5 mg total) by mouth daily., Disp: 30 tablet, Rfl: 12   nitroGLYCERIN  (NITROSTAT ) 0.4 MG SL tablet, Place 1 tablet (0.4 mg total) under the tongue every 5 (five) minutes as needed for chest pain., Disp: 25 tablet, Rfl: 6  Allergies:  Allergies  Allergen Reactions   Atorvastatin       constipated   Latex Hives    Allergic reaction to condoms years ago.    Past Medical History, Surgical history, Social history, and Family History were reviewed and updated.  Review of Systems: Review of Systems  Constitutional: Negative.   HENT:  Negative.    Eyes: Negative.   Respiratory: Negative.    Cardiovascular: Negative.   Gastrointestinal: Negative.   Endocrine: Negative.   Genitourinary: Negative.    Musculoskeletal: Negative.   Skin: Negative.   Neurological: Negative.   Hematological: Negative.   Psychiatric/Behavioral: Negative.      Physical Exam:  height is 5' 3.25 (1.607 m) and weight is 232 lb 1.9 oz (105.3 kg). Her oral temperature is 98.4 F (36.9 C). Her blood pressure is 132/80 and her pulse is 72. Her respiration is 20 and oxygen saturation is 99%.   Wt Readings from Last 3 Encounters:  03/31/23 232 lb 1.9 oz (105.3 kg)  12/22/22 225 lb (102.1 kg)  11/24/22 230 lb (104.3 kg)    Physical Exam Vitals reviewed.  Constitutional:      Comments: Chest wall exam shows left chest wall with mastectomy.  She has no chest wall nodules.  There is no left axillary adenopathy.  Right chest wall shows the healing mastectomy.  She has little bit of fluctuance in the lateral aspect of  the mastectomy.  It is nontender.  There is some hyperpigmentation from past radiation.  There is no nipple discharge.  There is no erythema.    There is no right axillary adenopathy.  HENT:     Head: Normocephalic and atraumatic.  Eyes:     Pupils: Pupils are equal, round, and reactive to light.  Cardiovascular:     Rate and Rhythm: Normal rate and regular rhythm.     Heart sounds: Normal heart sounds.  Pulmonary:     Effort: Pulmonary effort is normal.     Breath sounds: Normal breath sounds.  Abdominal:     General: Bowel sounds are normal.     Palpations: Abdomen is soft.  Musculoskeletal:        General: No tenderness or deformity. Normal range of motion.     Cervical  back: Normal range of motion.  Lymphadenopathy:     Cervical: No cervical adenopathy.  Skin:    General: Skin is warm and dry.     Findings: No erythema or rash.  Neurological:     Mental Status: She is alert and oriented to person, place, and time.  Psychiatric:        Behavior: Behavior normal.        Thought Content: Thought content normal.        Judgment: Judgment normal.      Lab Results  Component Value Date   WBC 3.8 (L) 03/31/2023   HGB 12.6 03/31/2023   HCT 38.1 03/31/2023   MCV 88.4 03/31/2023   PLT 218 03/31/2023     Chemistry      Component Value Date/Time   NA 143 03/31/2023 0914   NA 143 02/16/2022 1006   K 5.3 (H) 03/31/2023 0914   CL 106 03/31/2023 0914   CO2 30 03/31/2023 0914   BUN 16 03/31/2023 0914   BUN 13 02/16/2022 1006   CREATININE 0.73 03/31/2023 0914      Component Value Date/Time   CALCIUM  9.8 03/31/2023 0914   ALKPHOS 85 03/31/2023 0914   AST 15 03/31/2023 0914   ALT 9 03/31/2023 0914   BILITOT 0.5 03/31/2023 0914      Impression and Plan: [name] is a very charming 63 year old postmenopausal African-American female.  This is her third breast cancer.  I do not believe this is a recurrence.  I believe this is a new breast cancer.  I am not sure as to why she keeps having breast cancer.  I think she underwent genetic testing.  I think the genetic testing was negative.  We will follow her every 3 months.  She continues on the Femara .  She does not wish to have Zometa .  As such, we will hold off on this.  I am glad that she is done so well.  She has shown a lot of toughness.    Jaime JONELLE Crease, MD 1/3/202510:24 AM

## 2023-04-01 LAB — CANCER ANTIGEN 27.29: CA 27.29: 18.8 U/mL (ref 0.0–38.6)

## 2023-04-02 ENCOUNTER — Other Ambulatory Visit: Payer: Self-pay

## 2023-04-03 ENCOUNTER — Other Ambulatory Visit: Payer: Self-pay

## 2023-04-05 ENCOUNTER — Ambulatory Visit: Payer: Medicare Other | Admitting: Family

## 2023-04-05 DIAGNOSIS — Z720 Tobacco use: Secondary | ICD-10-CM | POA: Diagnosis not present

## 2023-04-05 DIAGNOSIS — I10 Essential (primary) hypertension: Secondary | ICD-10-CM

## 2023-04-05 DIAGNOSIS — R739 Hyperglycemia, unspecified: Secondary | ICD-10-CM

## 2023-04-05 DIAGNOSIS — C50911 Malignant neoplasm of unspecified site of right female breast: Secondary | ICD-10-CM

## 2023-04-05 DIAGNOSIS — H4010X3 Unspecified open-angle glaucoma, severe stage: Secondary | ICD-10-CM | POA: Diagnosis not present

## 2023-04-05 DIAGNOSIS — E875 Hyperkalemia: Secondary | ICD-10-CM | POA: Diagnosis not present

## 2023-04-05 LAB — HEMOGLOBIN A1C: Hgb A1c MFr Bld: 6.3 % (ref 4.6–6.5)

## 2023-04-05 LAB — POTASSIUM: Potassium: 4.2 meq/L (ref 3.5–5.1)

## 2023-04-05 MED ORDER — TETANUS-DIPHTHERIA TOXOIDS TD 2-2 LF/0.5ML IM SUSP: 0.5000 mL | Freq: Once | INTRAMUSCULAR | 0 refills | Status: AC

## 2023-04-05 MED ORDER — MELOXICAM 7.5 MG PO TABS: 7.5000 mg | ORAL_TABLET | Freq: Every day | ORAL | 0 refills | Status: DC | PRN

## 2023-04-05 MED ORDER — SHINGRIX 50 MCG/0.5ML IM SUSR: INTRAMUSCULAR | 1 refills | Status: DC

## 2023-04-05 NOTE — Assessment & Plan Note (Signed)
 Discussed healthy diet/exercise, limiting calories to 1500/day or less.

## 2023-04-05 NOTE — Assessment & Plan Note (Signed)
 She continues to follow with Oncology.   Current Therapy:        Status post right modified radical mastectomy on 03/08/2022 Taxotere /carboplatinum -s/p cycle 5/5 to start on 04/14/2022 -completed on 07/05/2022 Femara  2.5 mg p.o. daily-start 7 years on 08/03/2022

## 2023-04-05 NOTE — Assessment & Plan Note (Signed)
 BP Readings from Last 3 Encounters:  04/05/23 (!) 129/57  03/31/23 132/80  12/22/22 (!) 134/51   Stable without medication.

## 2023-04-05 NOTE — Assessment & Plan Note (Signed)
 Stable, following with ophthalmology (Dr. Willey Blade) [facility] and maintained on xalatan drops.

## 2023-04-05 NOTE — Patient Instructions (Signed)
 VISIT SUMMARY:  During today's visit, we discussed your ongoing bone pain in the thumb and shoulder, your challenges with weight management, and your efforts to quit smoking. We also reviewed your current treatment for glaucoma and planned for general health maintenance.  YOUR PLAN:  -ARTHRITIS: Arthritis is a condition that causes pain and inflammation in the joints. We have prescribed an anti-inflammatory medication to help manage your pain. If the pain in your shoulder and thumb persists or worsens, we may refer you to an orthopedist for further evaluation.  -WEIGHT MANAGEMENT: Despite your efforts to exercise and eat healthily, you have been struggling with weight gain. We recommend counting your calories to ensure a daily intake of 1500 calories or less. We may also refer you to the [facility] for additional support. Additionally, we discussed the potential use of Wellbutrin to help curb cravings and aid in weight loss.  -SMOKING CESSATION: You are currently smoking 1-2 cigarettes daily and using a vape, and you have expressed a desire to quit. We discussed the potential use of Wellbutrin to help you stop smoking.  -GLAUCOMA: Glaucoma is a condition that damages the eye's optic nerve and can lead to vision loss. You are currently using Zileuton/Latanoprost  drops and are under the care of an ophthalmologist. Please continue your current treatment and follow up with your eye doctor as scheduled.  -GENERAL HEALTH MAINTENANCE: We have ordered A1C and kidney function tests to monitor your overall health. Prescriptions for a tetanus shot and [facility]les [facility]. We will plan to follow up in six months.  INSTRUCTIONS:  Please follow up in six months for a routine check-up. If your bone pain persists or worsens, consider seeing an orthopedist. Continue your current glaucoma treatment and follow up with your ophthalmologist as  scheduled. Remember [facility]

## 2023-04-05 NOTE — Assessment & Plan Note (Signed)
 Has tried chantix, caused bad dreams.  Interested in quitting, will think about wellbutrin.

## 2023-04-05 NOTE — Progress Notes (Signed)
 Subjective:     Patient ID: Jaime [name], female    DOB: 10-26-1960, 63 y.o.   MRN: [id number]  Chief Complaint  Patient presents with   Hypertension    Here for follow up   Shoulder Pain    Patient reports left shoulder pain    HPI  Discussed the use of AI scribe software for clinical note transcription with the patient, who gave verbal consent to proceed.  History of Present Illness   The patient, with a history of glaucoma and an unspecified condition managed with Femara , presents with bone pain in the thumb and shoulder. The pain is suspected to be arthritis, although this has not been confirmed. The patient also reports difficulty in raising the left shoulder, which is more problematic than the right. The patient is right-handed.  In addition to the bone pain, the patient has been struggling with weight gain. Despite efforts to exercise regularly and maintain a healthy diet, the patient has not been successful in losing weight. The patient suspects that the Femara  medication might be contributing to the weight gain.  The patient also has a history of smoking but has been trying to quit. The patient currently smokes one or two cigarettes in the morning and occasionally during the day. The patient has previously tried Chantix to quit smoking but stopped due to experiencing vivid dreams.  Wt Readings from Last 3 Encounters:  04/05/23 230 lb (104.3 kg)  03/31/23 232 lb 1.9 oz (105.3 kg)  12/22/22 225 lb (102.1 kg)         Health Maintenance Due  Topic Date Due   DTaP/Tdap/Td (1 - Tdap) Never done   Zoster Vaccines- Shingrix  (1 of 2) Never done   COVID-19 Vaccine (3 - Pfizer risk series) 08/26/2019    Past Medical History:  Diagnosis Date   Acute vaginitis 10/14/2022   Aortic atherosclerosis (HCC) 03/13/2020   Atypical chest pain 02/13/2019   Breast CA (HCC) 03/08/2022   Breast cancer (HCC)    BIL  mastectomy left    lumpectomy right   DOE (dyspnea on  exertion) 12/29/2021   Ductal carcinoma (HCC) 02/01/2022   Emphysema lung (HCC) 03/13/2020   Emphysema lung (HCC)    Genetic testing 03/03/2022   Negative genetic testing on the CancerNext-Expanded+RNAinsight panel.  The report date is March 02, 2022.     The CancerNext-Expanded gene panel offered by [facility] and includes sequencing and rearrangement analysis for the following 77 genes: AIP, ALK, APC*, ATM*, AXIN2, BAP1, BARD1, BLM, BMPR1A, BRCA1*, BRCA2*, BRIP1*, CDC73, CDH1*, CDK4, CDKN1B, CDKN2A, CHEK2*, CTNNA1, DICER1, FANCC, FH   Glaucoma    Heart murmur    ECHO scheduled 03/04/22   History of alcohol abuse    no drinking since 1999   History of drug abuse (HCC)    1996- last drug use- been clean since then   Hx of breast cancer 02/13/2019   Right (2004) pT1cN1 cM0 triple negative s/p lumpectomy and ALND with adjuvant ddAc-T and radiation Left (2018) - Stage IIA cT2N0M0 ER-/PR-/Her2+ and Stage IA CT1CN0 ER-/PR-/Her2- both ypT1aN0(sn) grade 3 - s/p 6C neoadjuvant chemo, and mastectomy and SLNB on 09/15/2017 (records scanned)   Hyperglycemia 10/14/2022   Hypertension 10/14/2022   Loose stools 10/03/2022   Lower extremity edema 10/03/2022   Lung mass 02/13/2019   Mixed dyslipidemia 02/03/2022   Open-angle glaucoma 02/13/2019   Personal history of chemotherapy    Personal history of radiation therapy    Personal history of  tobacco use, presenting hazards to health 03/12/2019   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) 03/08/2022   Right thyroid  nodule    S/P left mastectomy 02/13/2019   Sleep apnea    mild per pt, no CPAP recommended   Surgical wound infection 04/01/2022   Tobacco use 02/13/2019    Past Surgical History:  Procedure Laterality Date   BREAST BIOPSY Left 2019   BREAST BIOPSY Right 01/31/2022   MM RT BREAST BX W LOC DEV 1ST LESION IMAGE BX SPEC STEREO GUIDE 01/31/2022 [facility]   BREAST LUMPECTOMY Right 2004   triple Neg breast  CA   DILATION AND CURETTAGE OF UTERUS  1985   MASTECTOMY Left 08/2017   TOTAL MASTECTOMY Right 03/08/2022   Procedure: RIGHT  MASTECTOMY;  Surgeon: Belinda Cough, MD;  Location: [facility] OR;  Service: General;  Laterality: Right;  PEC BLOCK   TUBAL LIGATION  1991   UNILATERAL SALPINGECTOMY  1985    Family History  Problem Relation Age of Onset   Alcohol abuse Mother    Lung cancer Mother    Esophageal cancer Mother    Throat cancer Mother    Alcohol abuse Father    Arthritis Father    Kidney disease Father    Dementia Father    Arthritis Sister    Diabetes Sister    Arthritis Brother    Hypertension Brother    Sickle cell anemia Brother    HIV Brother    Heart attack Brother 20       massive MI   Thyroid  disease Brother     Social History   Socioeconomic History   Marital status: Married    Spouse name: Not on file   Number of children: 2   Years of education: associates   Highest education level: Associate degree: academic program  Occupational History   Not on file  Tobacco Use   Smoking status: Every Day    Current packs/day: 0.25    Average packs/day: 0.3 packs/day for 42.0 years (10.5 ttl pk-yrs)    Types: Cigarettes   Smokeless tobacco: Never  Vaping Use   Vaping status: Some Days   Substances: Nicotine  Substance and Sexual Activity   Alcohol use: Not Currently    Comment: quit in 1999   Drug use: Not Currently    Comment: quit in 1996-street drugs (coccaine, marijuana)   Sexual activity: Not Currently  Other Topics Concern   Not on file  Social History Narrative   02/13/19   From: Born in [place]  but grew up in [place]   Living: with husband   Work: currently on disability      Family: Daughter - Social Worker (biological daughter) Son - Carliss (biological son) has one step son       Enjoys: walking, picnics, bowling, reading, playing cards      Exercise: walking - tries to do everyday - uses fitbit and tries to get 5000 steps   Diet: veggies, water,  2-3 cups of coffee, trying to monitor what she eats      Safety   Seat belts: Yes    Guns: No   Safe in relationships: Yes    Social Drivers of Corporate Investment Banker Strain: Medium Risk (04/04/2023)   Overall Financial Resource Strain (CARDIA)    Difficulty of Paying Living Expenses: Somewhat hard  Food Insecurity: No Food Insecurity (09/26/2022)   Hunger Vital Sign    Worried About Running Out of Food in the  Last Year: Never true    Ran Out of Food in the Last Year: Never true  Recent Concern: Food Insecurity - Food Insecurity Present (07/07/2022)   Hunger Vital Sign    Worried About Running Out of Food in the Last Year: Sometimes true    Ran Out of Food in the Last Year: Sometimes true  Transportation Needs: No Transportation Needs (04/04/2023)   PRAPARE - Administrator, Civil Service (Medical): No    Lack of Transportation (Non-Medical): No  Physical Activity: Insufficiently Active (04/04/2023)   Exercise Vital Sign    Days of Exercise per Week: 2 days    Minutes of Exercise per Session: 10 min  Stress: No Stress Concern Present (04/04/2023)   [employer] of Occupational Health - Occupational Stress Questionnaire    Feeling of Stress : Not at all  Social Connections: Socially Integrated (04/04/2023)   Social Connection and Isolation Panel [NHANES]    Frequency of Communication with Friends and Family: More than three times a week    Frequency of Social Gatherings with Friends and Family: Twice a week    Attends Religious Services: More than 4 times per year    Active Member of [employer] or Organizations: No    Attends Engineer, Structural: More than 4 times per year    Marital Status: Married  Catering Manager Violence: Not At Risk (07/13/2022)   Humiliation, Afraid, Rape, and Kick questionnaire    Fear of Current or Ex-Partner: No    Emotionally Abused: No    Physically Abused: No    Sexually Abused: No    Outpatient Medications Prior to Visit   Medication Sig Dispense Refill   aspirin EC 81 MG tablet Take 81 mg by mouth daily. Swallow whole.     Cholecalciferol (VITAMIN D3) 75 MCG (3000 UT) TABS Take 1 tablet by mouth daily at 6 (six) AM.     ibuprofen (ADVIL) 200 MG tablet Take 400 mg by mouth every 6 (six) hours as needed for moderate pain.     latanoprost  (XALATAN ) 0.005 % ophthalmic solution Place 1 drop into both eyes daily.     letrozole  (FEMARA ) 2.5 MG tablet Take 1 tablet (2.5 mg total) by mouth daily. 30 tablet 12   nitroGLYCERIN  (NITROSTAT ) 0.4 MG SL tablet Place 1 tablet (0.4 mg total) under the tongue every 5 (five) minutes as needed for chest pain. 25 tablet 6   No facility-administered medications prior to visit.    Allergies  Allergen Reactions   Atorvastatin      constipated   Latex Hives    Allergic reaction to condoms years ago.    ROS    See HPI Objective:    Physical Exam Constitutional:      General: She is not in acute distress.    Appearance: Normal appearance. She is well-developed.  HENT:     Head: Normocephalic and atraumatic.     Right Ear: External ear normal.     Left Ear: External ear normal.  Eyes:     General: No scleral icterus. Neck:     Thyroid : No thyromegaly.  Cardiovascular:     Rate and Rhythm: Normal rate and regular rhythm.     Heart sounds: Normal heart sounds. No murmur heard. Pulmonary:     Effort: Pulmonary effort is normal. No respiratory distress.     Breath sounds: Normal breath sounds. No wheezing.  Musculoskeletal:     Cervical back: Neck supple.  Skin:  General: Skin is warm and dry.  Neurological:     Mental Status: She is alert and oriented to person, place, and time.  Psychiatric:        Mood and Affect: Mood normal.        Behavior: Behavior normal.        Thought Content: Thought content normal.        Judgment: Judgment normal.      BP (!) 129/57 (BP Location: Right Arm, Patient Position: Sitting, Cuff Size: Large)   Pulse 87   Temp 97.8  F (36.6 C) (Oral)   Resp 16   Ht 5' 4 (1.626 m)   Wt 230 lb (104.3 kg)   SpO2 100%   BMI 39.48 kg/m  Wt Readings from Last 3 Encounters:  04/05/23 230 lb (104.3 kg)  03/31/23 232 lb 1.9 oz (105.3 kg)  12/22/22 225 lb (102.1 kg)       Assessment & Plan:   Problem List Items Addressed This Visit       Unprioritized   Tobacco use   Has tried chantix, caused bad dreams.  Interested in quitting, will think about wellbutrin.       Recurrent infiltrating ductal carcinoma of right breast [facility])   She continues to follow with Oncology.   Current Therapy:        Status post right modified radical mastectomy on 03/08/2022 Taxotere /carboplatinum -s/p cycle 5/5 to start on 04/14/2022 -completed on 07/05/2022 Femara  2.5 mg p.o. daily-start 7 years on 08/03/2022      Open-angle glaucoma   Stable, following with ophthalmology (Dr. Adine Novak) [facility] and maintained on xalatan  drops.       Morbid obesity (HCC) - Primary   Discussed healthy diet/exercise, limiting calories to 1500/day or less.        Hypertension   BP Readings from Last 3 Encounters:  04/05/23 (!) 129/57  03/31/23 132/80  12/22/22 (!) 134/51   Stable without medication.       Hyperglycemia   Relevant Orders   HgB A1c   Other Visit Diagnoses       Hyperkalemia       Relevant Orders   Potassium       I am having Jordan C. Zerbe start on meloxicam , diptheria-tetanus toxoids, and Shingrix . I am also having her maintain her latanoprost , nitroGLYCERIN , aspirin EC, ibuprofen, letrozole , and Vitamin D3.  Meds ordered this encounter  Medications   meloxicam  (MOBIC ) 7.5 MG tablet    Sig: Take 1 tablet (7.5 mg total) by mouth daily as needed for pain.    Dispense:  30 tablet    Refill:  0    Supervising Provider:   DOMENICA BLACKBIRD A [[id number]]   diptheria-tetanus toxoids (DECAVAC) 2-2 LF/0.5ML injection    Sig: Inject 0.5 mLs into the muscle once for 1 dose.    Dispense:  0.5 mL    Refill:  0     Supervising Provider:   DOMENICA BLACKBIRD A [[id number]]   Zoster Vaccine Adjuvanted (SHINGRIX ) injection    Sig: 0.5mcg IM now and again in 2-6 months.    Dispense:  0.5 mL    Refill:  1    Supervising Provider:   DOMENICA BLACKBIRD A [[id number]]

## 2023-04-06 ENCOUNTER — Other Ambulatory Visit: Payer: Self-pay

## 2023-04-06 ENCOUNTER — Telehealth: Payer: Self-pay | Admitting: Family

## 2023-04-06 DIAGNOSIS — R739 Hyperglycemia, unspecified: Secondary | ICD-10-CM

## 2023-04-06 NOTE — Telephone Encounter (Signed)
 Patient notified of results, comments ad recommendations from provider. She verbalized understanding

## 2023-04-06 NOTE — Telephone Encounter (Signed)
 Please advise pt that her lab work shows borderline diabetes.  Please work exercise, weight loss and  avoiding concentrated sweets, and limiting white carbs (rice/bread/pasta/potatoes).  Instead substitute whole grain versions with reasonable portions.  Follow up potassium is normal.

## 2023-05-31 ENCOUNTER — Encounter: Payer: Self-pay | Admitting: Hematology & Oncology

## 2023-05-31 ENCOUNTER — Encounter: Payer: Self-pay | Admitting: Family

## 2023-06-15 ENCOUNTER — Encounter: Payer: Self-pay | Admitting: Family

## 2023-06-15 ENCOUNTER — Encounter: Payer: Self-pay | Admitting: Hematology & Oncology

## 2023-06-30 ENCOUNTER — Inpatient Hospital Stay (HOSPITAL_BASED_OUTPATIENT_CLINIC_OR_DEPARTMENT_OTHER): Payer: Medicare Other | Admitting: Hematology & Oncology

## 2023-06-30 ENCOUNTER — Inpatient Hospital Stay: Payer: Self-pay | Attending: Hematology & Oncology

## 2023-06-30 ENCOUNTER — Other Ambulatory Visit: Payer: Self-pay | Admitting: *Deleted

## 2023-06-30 ENCOUNTER — Encounter: Payer: Self-pay | Admitting: Hematology & Oncology

## 2023-06-30 VITALS — BP 151/93 | HR 74 | Temp 98.4°F | Resp 20 | Ht 63.25 in | Wt 229.1 lb

## 2023-06-30 DIAGNOSIS — Z170 Estrogen receptor positive status [ER+]: Secondary | ICD-10-CM | POA: Insufficient documentation

## 2023-06-30 DIAGNOSIS — C50911 Malignant neoplasm of unspecified site of right female breast: Secondary | ICD-10-CM

## 2023-06-30 DIAGNOSIS — C50011 Malignant neoplasm of nipple and areola, right female breast: Secondary | ICD-10-CM

## 2023-06-30 DIAGNOSIS — C50919 Malignant neoplasm of unspecified site of unspecified female breast: Secondary | ICD-10-CM

## 2023-06-30 DIAGNOSIS — Z79811 Long term (current) use of aromatase inhibitors: Secondary | ICD-10-CM | POA: Insufficient documentation

## 2023-06-30 DIAGNOSIS — Z1379 Encounter for other screening for genetic and chromosomal anomalies: Secondary | ICD-10-CM

## 2023-06-30 DIAGNOSIS — M818 Other osteoporosis without current pathological fracture: Secondary | ICD-10-CM

## 2023-06-30 DIAGNOSIS — Z853 Personal history of malignant neoplasm of breast: Secondary | ICD-10-CM

## 2023-06-30 DIAGNOSIS — E559 Vitamin D deficiency, unspecified: Secondary | ICD-10-CM

## 2023-06-30 DIAGNOSIS — Z95828 Presence of other vascular implants and grafts: Secondary | ICD-10-CM

## 2023-06-30 LAB — CBC WITH DIFFERENTIAL (CANCER CENTER ONLY)
Abs Immature Granulocytes: 0.01 10*3/uL (ref 0.00–0.07)
Basophils Absolute: 0 10*3/uL (ref 0.0–0.1)
Basophils Relative: 1 %
Eosinophils Absolute: 0.1 10*3/uL (ref 0.0–0.5)
Eosinophils Relative: 3 %
HCT: 36.7 % (ref 36.0–46.0)
Hemoglobin: 12 g/dL (ref 12.0–15.0)
Immature Granulocytes: 0 %
Lymphocytes Relative: 56 %
Lymphs Abs: 2.1 10*3/uL (ref 0.7–4.0)
MCH: 29.3 pg (ref 26.0–34.0)
MCHC: 32.7 g/dL (ref 30.0–36.0)
MCV: 89.7 fL (ref 80.0–100.0)
Monocytes Absolute: 0.4 10*3/uL (ref 0.1–1.0)
Monocytes Relative: 10 %
Neutro Abs: 1.1 10*3/uL — ABNORMAL LOW (ref 1.7–7.7)
Neutrophils Relative %: 30 %
Platelet Count: 241 10*3/uL (ref 150–400)
RBC: 4.09 MIL/uL (ref 3.87–5.11)
RDW: 13.2 % (ref 11.5–15.5)
Smear Review: NORMAL
WBC Count: 3.6 10*3/uL — ABNORMAL LOW (ref 4.0–10.5)
nRBC: 0 % (ref 0.0–0.2)

## 2023-06-30 LAB — CMP (CANCER CENTER ONLY)
ALT: 8 U/L (ref 0–44)
AST: 13 U/L — ABNORMAL LOW (ref 15–41)
Albumin: 4.2 g/dL (ref 3.5–5.0)
Alkaline Phosphatase: 80 U/L (ref 38–126)
Anion gap: 7 (ref 5–15)
BUN: 13 mg/dL (ref 8–23)
CO2: 30 mmol/L (ref 22–32)
Calcium: 9.5 mg/dL (ref 8.9–10.3)
Chloride: 106 mmol/L (ref 98–111)
Creatinine: 0.68 mg/dL (ref 0.44–1.00)
GFR, Estimated: >60 mL/min (ref >60)
Glucose, Bld: 87 mg/dL (ref 70–99)
Potassium: 4 mmol/L (ref 3.5–5.1)
Sodium: 143 mmol/L (ref 135–145)
Total Bilirubin: 0.5 mg/dL (ref 0.0–1.2)
Total Protein: 6.8 g/dL (ref 6.5–8.1)

## 2023-06-30 LAB — LACTATE DEHYDROGENASE: LDH: 177 U/L (ref 98–192)

## 2023-06-30 NOTE — Progress Notes (Signed)
 [facility] Hematology and Oncology Follow Up Visit  Jaime [name] [id number] 12/05/1960 63 y.o. 06/30/2023   Principle Diagnosis:  Stage IIa (T2N0M0) infiltrating ductal carcinoma of the right breast- ER+/PR-/HER2- --  Oncotype = 52  Current Therapy:   Status post right modified radical mastectomy on 03/08/2022 Taxotere/carboplatinum -s/p cycle 5/5 to start on 04/14/2022 -completed on 07/05/2022 Femara 2.5 mg p.o. [name]-start 7 years on 08/03/2022     Interim History:  Jaime [name] is back for follow-up.  She is doing quite well.  We last saw her back in January.  Since then, she has been doing okay.  Apparently, she is prediabetic.  Her doctor wants her to cut out a lot of carbohydrates.  She is trying to lose weight.  I told her that the Femara could certainly be causing her to maintain or gain weight.  She does not complain of any joint pains.  There is no bleeding.  She has had no problems with COVID or Influenza.  She has had no change in bowel or bladder habits.  There has been no rashes.  She has had no headache.  She has had no cough or shortness of breath.  Overall, I would have to say that her performance status ECOG 1.   Medications:  Current Outpatient Medications:    Ascorbic Acid (VITAMIN C ADULT GUMMIES PO), Take by mouth., Disp: , Rfl:    aspirin EC 81 MG tablet, Take 81 mg by mouth [name]. Swallow whole., Disp: , Rfl:    Cholecalciferol (VITAMIN D3) 75 MCG (3000 UT) TABS, Take 1 tablet by mouth [name] at 6 (six) AM., Disp: , Rfl:    ibuprofen (ADVIL) 200 MG tablet, Take 400 mg by mouth every 6 (six) hours as needed for moderate pain., Disp: , Rfl:    latanoprost (XALATAN) 0.005 % ophthalmic solution, Place 1 drop into both eyes [name]., Disp: , Rfl:    letrozole (FEMARA) 2.5 MG tablet, Take 1 tablet (2.5 mg total) by mouth [name]., Disp: 30 tablet, Rfl: 12   nitroGLYCERIN (NITROSTAT) 0.4 MG SL tablet, Place 1 tablet (0.4 mg total) under the tongue every 5 (five)  minutes as needed for chest pain. (Patient not taking: Reported on 06/30/2023), Disp: 25 tablet, Rfl: 6  Allergies:  Allergies  Allergen Reactions   Atorvastatin     constipated   Latex Hives    Allergic reaction to condoms years ago.    Past Medical History, Surgical history, Social history, and Family History were reviewed and updated.  Review of Systems: Review of Systems  Constitutional: Negative.   HENT:  Negative.    Eyes: Negative.   Respiratory: Negative.    Cardiovascular: Negative.   Gastrointestinal: Negative.   Endocrine: Negative.   Genitourinary: Negative.    Musculoskeletal: Negative.   Skin: Negative.   Neurological: Negative.   Hematological: Negative.   Psychiatric/Behavioral: Negative.      Physical Exam:  height is 5' 3.25" (1.607 m) and weight is 229 lb 1.3 oz (103.9 kg). Her oral temperature is 98.4 F (36.9 C). Her blood pressure is 151/93 (abnormal) and her pulse is 74. Her respiration is 20 and oxygen saturation is 99%.   Wt Readings from Last 3 Encounters:  06/30/23 229 lb 1.3 oz (103.9 kg)  04/05/23 230 lb (104.3 kg)  03/31/23 232 lb 1.9 oz (105.3 kg)    Physical Exam Vitals reviewed.  Constitutional:      Comments: Chest wall exam shows left chest wall with mastectomy.  She has no chest wall nodules.  There is no left axillary adenopathy.  Right chest wall shows the healing mastectomy.  She has little bit of fluctuance in the lateral aspect of the mastectomy.  It is nontender.  There is some hyperpigmentation from past radiation.  There is no nipple discharge.  There is no erythema.    There is no right axillary adenopathy.  HENT:     Head: Normocephalic and atraumatic.  Eyes:     Pupils: Pupils are equal, round, and reactive to light.  Cardiovascular:     Rate and Rhythm: Normal rate and regular rhythm.     Heart sounds: Normal heart sounds.  Pulmonary:     Effort: Pulmonary effort is normal.     Breath sounds: Normal breath sounds.   Abdominal:     General: Bowel sounds are normal.     Palpations: Abdomen is soft.  Musculoskeletal:        General: No tenderness or deformity. Normal range of motion.     Cervical back: Normal range of motion.  Lymphadenopathy:     Cervical: No cervical adenopathy.  Skin:    General: Skin is warm and dry.     Findings: No erythema or rash.  Neurological:     Mental Status: She is alert and oriented to person, place, and time.  Psychiatric:        Behavior: Behavior normal.        Thought Content: Thought content normal.        Judgment: Judgment normal.      Lab Results  Component Value Date   WBC 3.6 (L) 06/30/2023   HGB 12.0 06/30/2023   HCT 36.7 06/30/2023   MCV 89.7 06/30/2023   PLT 241 06/30/2023     Chemistry      Component Value Date/Time   NA 143 06/30/2023 0905   NA 143 02/16/2022 1006   K 4.0 06/30/2023 0905   CL 106 06/30/2023 0905   CO2 30 06/30/2023 0905   BUN 13 06/30/2023 0905   BUN 13 02/16/2022 1006   CREATININE 0.68 06/30/2023 0905      Component Value Date/Time   CALCIUM 9.5 06/30/2023 0905   ALKPHOS 80 06/30/2023 0905   AST 13 (L) 06/30/2023 0905   ALT 8 06/30/2023 0905   BILITOT 0.5 06/30/2023 0905      Impression and Plan: Jaime [name] is a very charming 63 year old postmenopausal African-American female.  This is her third breast cancer.  I do not believe this is a recurrence.  I believe this is a new breast cancer.  I am not sure as to why she keeps having breast cancer.  I think she underwent genetic testing.  I think the genetic testing was negative.  I forgot to mention that she had a recent birthday.  I know she had a wonderful birthday.  She is worried about the possibility of something in the right axilla.  I really cannot feel anything in the right axilla.  However, given her history, I think be worthwhile checking.  We will set her up with a ultrasound to see if this may show [place] something.  I still think we can get her back  in 4 months.  If there is any problems we will certainly get her back sooner.  At this point, we can probably get her back in 4 months.  I do not see any problem with respect to recurrence of breast cancer.  However, given her history  we do have to be cautious.    Josph Macho, MD 4/4/20259:53 AM

## 2023-06-30 NOTE — Progress Notes (Signed)
 BP remains elevated, instructed to monitor at home and notify PCP if it remains over 140/90, verbalized understanding.

## 2023-07-01 ENCOUNTER — Other Ambulatory Visit: Payer: Self-pay

## 2023-07-01 LAB — CANCER ANTIGEN 27.29: CA 27.29: 15.9 U/mL (ref 0.0–38.6)

## 2023-07-04 ENCOUNTER — Ambulatory Visit

## 2023-07-04 ENCOUNTER — Other Ambulatory Visit: Payer: Self-pay

## 2023-07-04 DIAGNOSIS — C50911 Malignant neoplasm of unspecified site of right female breast: Secondary | ICD-10-CM

## 2023-07-04 NOTE — Progress Notes (Signed)
 Faxing new order for [place] to [facility] [phone]. Per pt request. Insurance

## 2023-07-22 ENCOUNTER — Other Ambulatory Visit: Payer: Self-pay | Admitting: Hematology & Oncology

## 2023-07-23 ENCOUNTER — Encounter: Payer: Self-pay | Admitting: Hematology & Oncology

## 2023-07-23 ENCOUNTER — Encounter: Payer: Self-pay | Admitting: Family

## 2023-08-01 ENCOUNTER — Ambulatory Visit (INDEPENDENT_AMBULATORY_CARE_PROVIDER_SITE_OTHER)

## 2023-08-01 VITALS — Ht 64.0 in | Wt 229.0 lb

## 2023-08-01 DIAGNOSIS — Z0000 Encounter for general adult medical examination without abnormal findings: Secondary | ICD-10-CM

## 2023-08-01 NOTE — Progress Notes (Signed)
 Subjective:   Jaime [name] is a 63 y.o. who presents for a Medicare Wellness preventive visit.  Visit Complete: Virtual I connected with  Jaime [name] on 08/01/23 by a audio enabled telemedicine application and verified that I am speaking with the correct person using two identifiers.  Patient Location: Home  Provider Location: Home Office  I discussed the limitations of evaluation and management by telemedicine. The patient expressed understanding and agreed to proceed.  Vital Signs: Because this visit was a virtual/telehealth visit, some criteria may be missing or patient reported. Any vitals not documented were not able to be obtained and vitals that have been documented are patient reported.    Persons Participating in Visit: Patient.  AWV Questionnaire: No: Patient Medicare AWV questionnaire was not completed prior to this visit.  Cardiac Risk Factors include: advanced age (>36men, >37 women);smoking/ tobacco exposure;hypertension     Objective:    Today's Vitals   08/01/23 1432  Weight: 229 lb (103.9 kg)  Height: 5\' 4"  (1.626 m)   Body mass index is 39.31 kg/m.     08/01/2023    2:40 PM 06/30/2023    9:25 AM 03/31/2023    9:42 AM 12/22/2022    9:18 AM 09/21/2022    9:20 AM 08/03/2022    8:52 AM 07/13/2022    9:46 AM  Advanced Directives  Does Patient Have a Medical Advance Directive? No No No No No No No  Would patient like information on creating a medical advance directive? No - Patient declined No - Patient declined No - Patient declined No - Patient declined No - Patient declined No - Patient declined No - Patient declined    Current Medications (verified) Outpatient Encounter Medications as of 08/01/2023  Medication Sig   Ascorbic Acid (VITAMIN C ADULT GUMMIES PO) Take by mouth.   aspirin EC 81 MG tablet Take 81 mg by mouth daily. Swallow whole.   Cholecalciferol (VITAMIN D3) 75 MCG (3000 UT) TABS Take 1 tablet by mouth daily at 6 (six) AM.    ibuprofen (ADVIL) 200 MG tablet Take 400 mg by mouth every 6 (six) hours as needed for moderate pain.   latanoprost  (XALATAN ) 0.005 % ophthalmic solution Place 1 drop into both eyes daily.   letrozole  (FEMARA ) 2.5 MG tablet TAKE 1 TABLET BY MOUTH EVERY DAY   nitroGLYCERIN  (NITROSTAT ) 0.4 MG SL tablet Place 1 tablet (0.4 mg total) under the tongue every 5 (five) minutes as needed for chest pain. (Patient not taking: Reported on 06/30/2023)   No facility-administered encounter medications on file as of 08/01/2023.    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  The report date is March 02, 2022.     The CancerNext-Expanded gene panel offered by [facility] and includes sequencing and rearrangement analysis for the following 77 genes: AIP, ALK, APC*, ATM*, AXIN2, BAP1, BARD1, BLM, BMPR1A, BRCA1*, BRCA2*, BRIP1*, CDC73, CDH1*, CDK4, CDKN1B, CDKN2A, CHEK2*, CTNNA1, DICER1, FANCC, FH   Glaucoma    Heart  murmur    ECHO scheduled 03/04/22   History of alcohol abuse    no drinking since 1999   History of drug abuse (HCC)    1996- last drug use- been clean since then   Hx of breast cancer 02/13/2019   Right (2004) pT1cN1 cM0 triple negative s/p lumpectomy and ALND with adjuvant ddAc-T and radiation Left (2018) - Stage IIA cT2N0M0 ER-/PR-/Her2+ and Stage IA CT1CN0 ER-/PR-/Her2- both ypT1aN0(sn) grade 3 - s/p 6C neoadjuvant chemo, and mastectomy and SLNB on 09/15/2017 (records scanned)   Hyperglycemia 10/14/2022    Hypertension 10/14/2022   Loose stools 10/03/2022   Lower extremity edema 10/03/2022   Lung mass 02/13/2019   Mixed dyslipidemia 02/03/2022   Open-angle glaucoma 02/13/2019   Personal history of chemotherapy    Personal history of radiation therapy    Personal history of tobacco use, presenting hazards to health 03/12/2019   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) 03/08/2022   Right thyroid  nodule    S/P left mastectomy 02/13/2019   Sleep apnea    mild per pt, no CPAP recommended   Surgical wound infection 04/01/2022   Tobacco use 02/13/2019   Past Surgical History:  Procedure Laterality Date   BREAST BIOPSY Left 2019   BREAST BIOPSY Right 01/31/2022   MM RT BREAST BX W LOC DEV 1ST LESION IMAGE BX SPEC STEREO GUIDE 01/31/2022 [facility]   BREAST LUMPECTOMY Right 2004   triple Neg breast CA   DILATION AND CURETTAGE OF UTERUS  1985   MASTECTOMY Left 08/2017   TOTAL MASTECTOMY Right 03/08/2022   Procedure: RIGHT  MASTECTOMY;  Surgeon: Dareen Ebbing, MD;  Location: [facility] OR;  Service: General;  Laterality: Right;  PEC BLOCK   TUBAL LIGATION  1991   UNILATERAL SALPINGECTOMY  1985   Family History  Problem Relation Age of Onset   Alcohol abuse Mother    Lung cancer Mother    Esophageal cancer Mother    Throat cancer Mother    Alcohol abuse Father    Arthritis Father    Kidney disease Father    Dementia Father    Arthritis Sister    Diabetes Sister    Arthritis Brother    Hypertension Brother    Sickle cell anemia Brother    HIV Brother    Heart attack Brother 49       massive MI   Thyroid  disease Brother    Social History   Socioeconomic History   Marital status: Married    Spouse name: Not on file   Number of children: 2   Years of education: associates   Highest education level: Associate degree: academic program  Occupational History   Not on file  Tobacco Use   Smoking status: Every Day    Current packs/day: 0.25    Average  packs/day: 0.3 packs/day for 42.0 years (10.5 ttl pk-yrs)    Types: Cigarettes   Smokeless tobacco: Never  Vaping Use   Vaping status: Some Days   Substances: Nicotine  Substance and Sexual Activity   Alcohol use: Not Currently    Comment: quit in 1999   Drug use: Not Currently    Comment: quit in 1996-street drugs (coccaine, marijuana)   Sexual activity: Not Currently  Other Topics Concern   Not on file  Social History Narrative   02/13/19   From: Born in [place]  but grew up in [place]   Living: with husband   Work: currently on disability  Family: Daughter - Social worker (biological daughter) Son - Lenette Quick (biological son) has one step son       Enjoys: walking, picnics, bowling, reading, playing cards      Exercise: walking - tries to do everyday - uses fitbit and tries to get 5000 steps   Diet: veggies, water, 2-3 cups of coffee, trying to monitor what she eats      Safety   Seat belts: Yes    Guns: No   Safe in relationships: Yes    Social Drivers of Corporate investment banker Strain: Low Risk  (08/01/2023)   Overall Financial Resource Strain (CARDIA)    Difficulty of Paying Living Expenses: Not hard at all  Food Insecurity: No Food Insecurity (08/01/2023)   Hunger Vital Sign    Worried About Running Out of Food in the Last Year: Never true    Ran Out of Food in the Last Year: Never true  Transportation Needs: No Transportation Needs (08/01/2023)   PRAPARE - Administrator, Civil Service (Medical): No    Lack of Transportation (Non-Medical): No  Physical Activity: Inactive (08/01/2023)   Exercise Vital Sign    Days of Exercise per Week: 0 days    Minutes of Exercise per Session: 0 min  Stress: No Stress Concern Present (08/01/2023)   [employer] of Occupational Health - Occupational Stress Questionnaire    Feeling of Stress : Not at all  Social Connections: Moderately Isolated (08/01/2023)   Social Connection and Isolation Panel [NHANES]    Frequency of  Communication with Friends and Family: More than three times a week    Frequency of Social Gatherings with Friends and Family: More than three times a week    Attends Religious Services: Never    Database administrator or Organizations: No    Attends Engineer, structural: Never    Marital Status: Married    Tobacco Counseling Ready to quit: No Counseling given: Yes    Clinical Intake:  Pre-visit preparation completed: Yes  Pain : No/denies pain     BMI - recorded: 39.31 Nutritional Status: BMI > 30  Obese Nutritional Risks: None Diabetes: No  Lab Results  Component Value Date   HGBA1C 6.3 04/05/2023     How often do you need to have someone help you when you read instructions, pamphlets, or other written materials from your doctor or pharmacy?: 1 - Never  Interpreter Needed?: No  Information entered by :: Farris Hong LPN   Activities of Daily Living     08/01/2023    2:39 PM  In your present state of health, do you have any difficulty performing the following activities:  Hearing? 0  Vision? 0  Difficulty concentrating or making decisions? 0  Walking or climbing stairs? 0  Dressing or bathing? 0  Doing errands, shopping? 0  Preparing Food and eating ? N  Using the Toilet? N  In the past six months, have you accidently leaked urine? N  Do you have problems with loss of bowel control? N  Managing your Medications? N  Managing your Finances? N  Housekeeping or managing your Housekeeping? N    Patient Care Team: Dorrene Gaucher, NP as PCP - General (Internal Medicine) Revankar, Micael Adas, MD as PCP - Cardiology (Cardiology) Timmy Forbes, MD as Consulting Physician (Oncology) Ivor Mars, MD as Medical Oncologist (Oncology)  Indicate any recent Medical Services you may have received from other than [facility] providers in the past year (  date may be approximate).     Assessment:   This is a routine wellness examination for  [place].  Hearing/Vision screen Hearing Screening - Comments:: Denies hearing difficulties   Vision Screening - Comments:: Wears rx glasses - up to date with routine eye exams with  [facility]   Goals Addressed               This Visit's Progress     Increase physical activity (pt-stated)        Get and stay healthy!       Depression Screen     08/01/2023    2:38 PM 10/14/2022    8:55 AM 07/13/2022    9:48 AM 06/09/2021    9:41 AM 02/13/2019   11:03 AM  PHQ 2/9 Scores  PHQ - 2 Score 0 0 0 0 1  PHQ- 9 Score  0       Fall Risk     08/01/2023    2:39 PM 10/14/2022    8:55 AM 07/13/2022    9:47 AM 07/07/2022    2:29 PM 06/09/2021    9:35 AM  Fall Risk   Falls in the past year? 0 0 0 0 0  Number falls in past yr: 0 0 0 0 0  Injury with Fall? 0 0 0 0   Risk for fall due to : No Fall Risks No Fall Risks No Fall Risks No Fall Risks   Follow up Falls prevention discussed;Falls evaluation completed Falls evaluation completed Falls evaluation completed Falls evaluation completed     MEDICARE RISK AT HOME:  Medicare Risk at Home Any stairs in or around the home?: Yes If so, are there any without handrails?: No Home free of loose throw rugs in walkways, pet beds, electrical cords, etc?: Yes Adequate lighting in your home to reduce risk of falls?: Yes Life alert?: No Use of a cane, walker or w/c?: No Grab bars in the bathroom?: No Shower chair or bench in shower?: No Elevated toilet seat or a handicapped toilet?: No  TIMED UP AND GO:  Was the test performed?  No  Cognitive Function: 6CIT completed        08/01/2023    2:41 PM 07/13/2022    9:57 AM  6CIT Screen  What Year? 0 points 0 points  What month? 0 points 0 points  What time? 0 points 0 points  Count back from 20 0 points 0 points  Months in reverse 0 points 0 points  Repeat phrase 0 points 0 points  Total Score 0 points 0 points    Immunizations Immunization History  Administered Date(s)  Administered   PFIZER(Purple Top)SARS-COV-2 Vaccination 07/05/2019, 07/29/2019    Screening Tests Health Maintenance  Topic Date Due   DTaP/Tdap/Td (1 - Tdap) Never done   Pneumococcal Vaccine 11-71 Years old (1 of 2 - PCV) Never done   Zoster Vaccines- Shingrix  (1 of 2) Never done   COVID-19 Vaccine (3 - Pfizer risk series) 08/26/2019   INFLUENZA VACCINE  10/27/2023   MAMMOGRAM  01/06/2024   Medicare Annual Wellness (AWV)  07/31/2024   Fecal DNA (Cologuard)  04/13/2025   Cervical Cancer Screening (HPV/Pap Cotest)  06/10/2026   Hepatitis C Screening  Completed   HIV Screening  Completed   HPV VACCINES  Aged Out   Meningococcal B Vaccine  Aged Out   Lung Cancer Screening  Discontinued    Health Maintenance  Health Maintenance Due  Topic Date Due  DTaP/Tdap/Td (1 - Tdap) Never done   Pneumococcal Vaccine 24-51 Years old (1 of 2 - PCV) Never done   Zoster Vaccines- Shingrix  (1 of 2) Never done   COVID-19 Vaccine (3 - Pfizer risk series) 08/26/2019   Health Maintenance Items Addressed: Vaccines deferred  Additional Screening:  Vision Screening: Recommended annual ophthalmology exams for early detection of glaucoma and other disorders of the eye.  Dental Screening: Recommended annual dental exams for proper oral hygiene  Community Resource Referral / Chronic Care Management: CRR required this visit?  No   CCM required this visit?  No     Plan:     I have personally reviewed and noted the following in the patient's chart:   Medical and social history Use of alcohol, tobacco or illicit drugs  Current medications and supplements including opioid prescriptions. Patient is not currently taking opioid prescriptions. Functional ability and status Nutritional status Physical activity Advanced directives List of other physicians Hospitalizations, surgeries, and ER visits in previous 12 months Vitals Screenings to include cognitive, depression, and falls Referrals  and appointments  In addition, I have reviewed and discussed with patient certain preventive protocols, quality metrics, and best practice recommendations. A written personalized care plan for preventive services as well as general preventive health recommendations were provided to patient.     Dewayne Ford, LPN   10/27/9560   After Visit Summary: (MyChart) Due to this being a telephonic visit, the after visit summary with patients personalized plan was offered to patient via MyChart   Notes: Nothing significant to report at this time.

## 2023-08-01 NOTE — Patient Instructions (Addendum)
 Jaime [name] , Thank you for taking time to come for your Medicare Wellness Visit. I appreciate your ongoing commitment to your health goals. Please review the following plan we discussed and let me know if I can assist you in the future.   Referrals/Orders/Follow-Ups/Clinician Recommendations:   This is a list of the screening recommended for you and due dates:  Health Maintenance  Topic Date Due   DTaP/Tdap/Td vaccine (1 - Tdap) Never done   Pneumococcal Vaccination (1 of 2 - PCV) Never done   Zoster (Shingles) Vaccine (1 of 2) Never done   COVID-19 Vaccine (3 - Pfizer risk series) 08/26/2019   Flu Shot  10/27/2023   Mammogram  01/06/2024   Medicare Annual Wellness Visit  07/31/2024   Cologuard (Stool DNA test)  04/13/2025   Pap with HPV screening  06/10/2026   Hepatitis C Screening  Completed   HIV Screening  Completed   HPV Vaccine  Aged Out   Meningitis B Vaccine  Aged Out   Screening for Lung Cancer  Discontinued    Advanced directives: (Declined) Advance directive discussed with you today. Even though you declined this today, please call our office should you change your mind, and we can give you the proper paperwork for you to fill out.  Next Medicare Annual Wellness Visit scheduled for next year: Yes

## 2023-08-02 ENCOUNTER — Other Ambulatory Visit: Payer: Self-pay

## 2023-08-09 DIAGNOSIS — C50911 Malignant neoplasm of unspecified site of right female breast: Secondary | ICD-10-CM | POA: Diagnosis not present

## 2023-08-09 DIAGNOSIS — Z170 Estrogen receptor positive status [ER+]: Secondary | ICD-10-CM | POA: Diagnosis not present

## 2023-08-23 ENCOUNTER — Telehealth: Payer: Self-pay

## 2023-08-23 NOTE — Telephone Encounter (Signed)
 Jaime [name] (Patient)   Subject  Jaime [name], Jaime [name] (Patient)   Topic  Clinical - Medication Question    Communication  Reason for CRM: Patient is going to have a biopsy done and they want there to stop taking the aspirin for 5 days, want to make sure that is alright with the doctor, please call patient to advise [phone]

## 2023-08-24 NOTE — Telephone Encounter (Signed)
 Patient notified of this information.

## 2023-08-28 DIAGNOSIS — M7989 Other specified soft tissue disorders: Secondary | ICD-10-CM | POA: Diagnosis not present

## 2023-10-04 ENCOUNTER — Ambulatory Visit (INDEPENDENT_AMBULATORY_CARE_PROVIDER_SITE_OTHER): Payer: Medicare Other | Admitting: Family

## 2023-10-04 ENCOUNTER — Other Ambulatory Visit (HOSPITAL_BASED_OUTPATIENT_CLINIC_OR_DEPARTMENT_OTHER): Payer: Self-pay

## 2023-10-04 ENCOUNTER — Encounter: Payer: Self-pay | Admitting: Hematology & Oncology

## 2023-10-04 ENCOUNTER — Encounter: Payer: Self-pay | Admitting: Family

## 2023-10-04 VITALS — BP 124/78 | HR 75 | Temp 98.5°F | Resp 18 | Ht 64.0 in | Wt 232.6 lb

## 2023-10-04 DIAGNOSIS — R739 Hyperglycemia, unspecified: Secondary | ICD-10-CM | POA: Diagnosis not present

## 2023-10-04 DIAGNOSIS — Z720 Tobacco use: Secondary | ICD-10-CM

## 2023-10-04 DIAGNOSIS — E559 Vitamin D deficiency, unspecified: Secondary | ICD-10-CM

## 2023-10-04 DIAGNOSIS — H4010X3 Unspecified open-angle glaucoma, severe stage: Secondary | ICD-10-CM

## 2023-10-04 DIAGNOSIS — R918 Other nonspecific abnormal finding of lung field: Secondary | ICD-10-CM

## 2023-10-04 DIAGNOSIS — E041 Nontoxic single thyroid nodule: Secondary | ICD-10-CM

## 2023-10-04 DIAGNOSIS — G4730 Sleep apnea, unspecified: Secondary | ICD-10-CM | POA: Diagnosis not present

## 2023-10-04 DIAGNOSIS — I10 Essential (primary) hypertension: Secondary | ICD-10-CM

## 2023-10-04 DIAGNOSIS — E782 Mixed hyperlipidemia: Secondary | ICD-10-CM | POA: Diagnosis not present

## 2023-10-04 DIAGNOSIS — Z87891 Personal history of nicotine dependence: Secondary | ICD-10-CM

## 2023-10-04 DIAGNOSIS — C50911 Malignant neoplasm of unspecified site of right female breast: Secondary | ICD-10-CM

## 2023-10-04 LAB — VITAMIN D 25 HYDROXY (VIT D DEFICIENCY, FRACTURES): VITD: 25.4 ng/mL — ABNORMAL LOW (ref 30.00–100.00)

## 2023-10-04 LAB — LIPID PANEL
Cholesterol: 228 mg/dL — ABNORMAL HIGH (ref 0–200)
HDL: 78.2 mg/dL (ref >39.00)
LDL Cholesterol: 132 mg/dL — ABNORMAL HIGH (ref 0–99)
NonHDL: 149.48
Total CHOL/HDL Ratio: 3
Triglycerides: 88 mg/dL (ref 0.0–149.0)
VLDL: 17.6 mg/dL (ref 0.0–40.0)

## 2023-10-04 LAB — HEMOGLOBIN A1C: Hgb A1c MFr Bld: 6.1 % (ref 4.6–6.5)

## 2023-10-04 MED ORDER — TETANUS-DIPHTH-ACELL PERTUSSIS 5-2.5-18.5 LF-MCG/0.5 IM SUSY
0.5000 mL | PREFILLED_SYRINGE | Freq: Once | INTRAMUSCULAR | 0 refills | Status: AC
  Filled 2023-10-04: qty 0.5, 1d supply, fill #0

## 2023-10-04 MED ORDER — ZOSTER VAC RECOMB ADJUVANTED 50 MCG/0.5ML IM SUSR
0.5000 mL | Freq: Once | INTRAMUSCULAR | 0 refills | Status: AC
  Filled 2023-10-04: qty 0.5, 1d supply, fill #0

## 2023-10-04 NOTE — Assessment & Plan Note (Signed)
  She smokes one cigarette daily and vapes. Experienced adverse effects with Chantix and removed nicotine patches. Desires to quit smoking but finds it difficult. Discussed various cessation aids and Wellbutrin as an option. - Discuss smoking cessation options including Nicorette gum, patches, lozenges, and inhalation device. - Consider Wellbutrin for smoking cessation if interested.

## 2023-10-04 NOTE — Assessment & Plan Note (Signed)
 Follow up CT clear 11/24.

## 2023-10-04 NOTE — Assessment & Plan Note (Signed)
<   1 cm, noted on US  2023- no follow up recommended.

## 2023-10-04 NOTE — Assessment & Plan Note (Signed)
 Uncontrolled. Refer to [facility].

## 2023-10-04 NOTE — Assessment & Plan Note (Signed)
 Lab Results  Component Value Date   CHOL 198 06/09/2021   HDL 74.10 06/09/2021   LDLCALC 112 (H) 06/09/2021   TRIG 64.0 06/09/2021   CHOLHDL 3 06/09/2021   Not on statin.  Update lipid panel.

## 2023-10-04 NOTE — Assessment & Plan Note (Signed)
 Follows with optometry, Reyes Bracket. Maintained on xalatan .

## 2023-10-04 NOTE — Assessment & Plan Note (Signed)
 BP Readings from Last 3 Encounters:  10/04/23 124/78  06/30/23 (!) 151/93  04/05/23 (!) 129/57   BP stable without medication. Monitor.

## 2023-10-04 NOTE — Assessment & Plan Note (Addendum)
 mild per pt (done around 2015)  no CPAP recommended.  Given weight gain- recommended that we update sleep study.

## 2023-10-04 NOTE — Assessment & Plan Note (Signed)
 Continues to follow with oncology for surveillance.

## 2023-10-04 NOTE — Assessment & Plan Note (Signed)
 Lab Results  Component Value Date   HGBA1C 6.3 04/05/2023   Update A1C, discussed diet/weight loss.

## 2023-10-04 NOTE — Patient Instructions (Signed)
 VISIT SUMMARY:  Today, we discussed your goals for smoking cessation and weight management. We reviewed your current smoking habits, weight concerns, and other health issues including borderline diabetes, obesity, obstructive sleep apnea, thyroid  nodule, and glaucoma. We also addressed general health maintenance and scheduled necessary follow-ups.  YOUR PLAN:  TOBACCO USE DISORDER: You smoke one cigarette daily and use a vape. Previous attempts to quit smoking with Chantix were unsuccessful, and you tend to remove nicotine patches. -Consider using Nicorette gum, patches, lozenges, or an inhalation device to help quit smoking. -Consider Wellbutrin for smoking cessation if interested.  BORDERLINE DIABETES MELLITUS: You are managing your diet and attempting exercise but are frustrated with weight loss efforts. We will monitor your A1c levels. -Order an A1c test. -Consider Ozempic injections if your A1c is 6.5 or higher.  OBESITY: You are struggling with weight loss despite dietary changes and exercise. -Refer to the [facility] for weight management.  OBSTRUCTIVE SLEEP APNEA (OSA): You have gained 30 pounds since your last sleep study and report snoring and daytime fatigue. -Refer to pulmonary for a home sleep study.  THYROID  NODULE: A thyroid  ultrasound in 2023 showed a small nodule that does not require follow-up. -Continue oncology surveillance.  GLAUCOMA: You are following up with your ophthalmologist and using Xalatan . -Continue using Xalatan  and follow up with Dr. Reyes Bracket in August.  GENERAL HEALTH MAINTENANCE: You are due for shingles and tetanus vaccines. Your lung cancer screening CT was negative in November and is due again this November. Your Cologuard test is valid until January 2027. -Check with the pharmacy for shingles and tetanus vaccines. -Order a lung cancer screening CT in early November.  FOLLOW-UP: We need to perform lab work and  schedule a follow-up appointment. -Perform lab work including A1c and cholesterol today. -Schedule a follow-up appointment in six months.

## 2023-10-04 NOTE — Progress Notes (Signed)
 Subjective:     Patient ID: Jaime [name], female    DOB: 1960-09-23, 63 y.o.   MRN: [id number]  Chief Complaint  Patient presents with   Nicotine Dependence   Follow-up    HPI  Discussed the use of AI scribe software for clinical note transcription with the patient, who gave verbal consent to proceed.  History of Present Illness  Jaime [name] is a 63 year old female who presents for smoking cessation and weight management.  She smokes one cigarette in the morning and uses a vape throughout the day. Previous attempts to quit smoking with Chantix resulted in adverse effects, and she tends to remove the nicotine patch. She is considering Nicorette gum or other nicotine replacement therapies to quit smoking, especially the morning cigarette with coffee.  She is concerned about her weight, experiencing difficulty losing weight despite dietary changes and exercise. Her diet includes avoiding rice, potatoes, pasta, and processed foods, primarily consuming cottage cheese with fruit for breakfast, skipping lunch, and eating meat and vegetables for dinner. She drinks water throughout the day and occasionally has a soda. She feels her weight affects her comfort and clothing fit and is interested in weight loss options. She describes a generally healthy diet but has not closely monitored her calorie intake.  She underwent a sleep study between 2015 and 2017 which noted mild sleep apnea.  She has gained approximately 30 pounds since then. She experiences occasional daytime tiredness and is told that she snores.      Health Maintenance Due  Topic Date Due   DTaP/Tdap/Td (1 - Tdap) Never done   Zoster Vaccines- Shingrix  (1 of 2) Never done   COVID-19 Vaccine (3 - Pfizer risk series) 08/26/2019    Past Medical History:  Diagnosis Date   Acute vaginitis 10/14/2022   Aortic atherosclerosis (HCC) 03/13/2020   Atypical chest pain 02/13/2019   Breast CA (HCC) 03/08/2022   Breast  cancer (HCC)    BIL  mastectomy left    lumpectomy right   DOE (dyspnea on exertion) 12/29/2021   Ductal carcinoma (HCC) 02/01/2022   Emphysema lung (HCC) 03/13/2020   Emphysema lung (HCC)    Genetic testing 03/03/2022   Negative genetic testing on the CancerNext-Expanded+RNAinsight panel.  The report date is March 02, 2022.     The CancerNext-Expanded gene panel offered by 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   Hx of breast cancer 02/13/2019   Right (2004) pT1cN1 cM0 triple negative s/p lumpectomy and ALND with adjuvant ddAc-T and radiation Left (2018) - Stage IIA cT2N0M0 ER-/PR-/Her2+ and Stage IA CT1CN0 ER-/PR-/Her2- both ypT1aN0(sn) grade 3 - s/p 6C neoadjuvant chemo, and mastectomy and SLNB on 09/15/2017 (records scanned)   Hyperglycemia 10/14/2022   Hypertension 10/14/2022   Loose stools 10/03/2022   Lower extremity edema 10/03/2022   Lung mass 02/13/2019   Mixed dyslipidemia 02/03/2022   Open-angle glaucoma 02/13/2019   Personal history of chemotherapy    Personal history of radiation therapy    Personal history of tobacco use, presenting hazards to health 03/12/2019  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) 03/08/2022   Right thyroid  nodule    S/P left mastectomy 02/13/2019   Sleep apnea    mild per pt, no CPAP recommended   Surgical wound infection 04/01/2022   Tobacco use 02/13/2019    Past Surgical History:  Procedure Laterality Date   BREAST BIOPSY Left 2019   BREAST BIOPSY Right 01/31/2022   MM RT BREAST BX W LOC DEV 1ST LESION IMAGE BX SPEC STEREO GUIDE  01/31/2022 [facility]   BREAST LUMPECTOMY Right 2004   triple Neg breast CA   DILATION AND CURETTAGE OF UTERUS  1985   MASTECTOMY Left 08/2017   TOTAL MASTECTOMY Right 03/08/2022   Procedure: RIGHT  MASTECTOMY;  Surgeon: Belinda Cough, MD;  Location: [facility] OR;  Service: General;  Laterality: Right;  PEC BLOCK   TUBAL LIGATION  1991   UNILATERAL SALPINGECTOMY  1985    Family History  Problem Relation Age of Onset   Alcohol abuse Mother    Lung cancer Mother    Esophageal cancer Mother    Throat cancer Mother    Alcohol abuse Father    Arthritis Father    Kidney disease Father    Dementia Father    Arthritis Sister    Diabetes Sister    Arthritis Brother    Hypertension Brother    Sickle cell anemia Brother    HIV Brother    Heart attack Brother 53       massive MI   Thyroid  disease Brother     Social History   Socioeconomic History   Marital status: Married    Spouse name: Not on file   Number of children: 2   Years of education: associates   Highest education level: Associate degree: academic program  Occupational History   Not on file  Tobacco Use   Smoking status: Every Day    Current packs/day: 0.25    Average packs/day: 0.3 packs/day for 42.0 years (10.5 ttl pk-yrs)    Types: Cigarettes   Smokeless tobacco: Never  Vaping Use   Vaping status: Some Days   Substances: Nicotine  Substance and Sexual Activity   Alcohol use: Not Currently    Comment: quit in 1999   Drug use: Not Currently    Comment: quit in 1996-street drugs (coccaine, marijuana)   Sexual activity: Not Currently  Other Topics Concern   Not on file  Social History Narrative   02/13/19   From: Born in [place]  but grew up in [place]   Living: with husband   Work: currently on disability      Family: Daughter - Social worker (biological daughter) Son - Carliss (biological son) has one step son       Enjoys: walking, picnics, bowling, reading, playing cards      Exercise: walking - tries  to do everyday - uses fitbit and tries to get 5000 steps   Diet: veggies, water, 2-3 cups of coffee, trying to monitor what she eats      Safety   Seat belts: Yes    Guns: No   Safe in relationships: Yes    Social Drivers of Corporate investment banker Strain: Medium Risk (09/27/2023)   Overall Financial Resource Strain (CARDIA)    Difficulty of Paying Living Expenses: Somewhat hard  Food Insecurity: Food Insecurity Present (09/27/2023)   Hunger Vital Sign    Worried About Running Out of Food in the Last Year: Sometimes true    Ran Out  of Food in the Last Year: Never true  Transportation Needs: No Transportation Needs (09/27/2023)   PRAPARE - Administrator, Civil Service (Medical): No    Lack of Transportation (Non-Medical): No  Physical Activity: Inactive (09/27/2023)   Exercise Vital Sign    Days of Exercise per Week: 0 days    Minutes of Exercise per Session: Not on file  Stress: No Stress Concern Present (09/27/2023)   [employer] of Occupational Health - Occupational Stress Questionnaire    Feeling of Stress: Only a little  Social Connections: Socially Integrated (09/27/2023)   Social Connection and Isolation Panel    Frequency of Communication with Friends and Family: More than three times a week    Frequency of Social Gatherings with Friends and Family: More than three times a week    Attends Religious Services: More than 4 times per year    Active Member of [employer] or Organizations: Yes    Attends Engineer, structural: More than 4 times per year    Marital Status: Married  Recent Concern: Social Connections - Moderately Isolated (08/01/2023)   Social Connection and Isolation Panel    Frequency of Communication with Friends and Family: More than three times a week    Frequency of Social Gatherings with Friends and Family: More than three times a week    Attends Religious Services: Never    Database administrator or Organizations: No    Attends Tax inspector Meetings: Never    Marital Status: Married  Catering manager Violence: Not At Risk (08/01/2023)   Humiliation, Afraid, Rape, and Kick questionnaire    Fear of Current or Ex-Partner: No    Emotionally Abused: No    Physically Abused: No    Sexually Abused: No    Outpatient Medications Prior to Visit  Medication Sig Dispense Refill   Ascorbic Acid (VITAMIN C ADULT GUMMIES PO) Take by mouth.     aspirin EC 81 MG tablet Take 81 mg by mouth daily. Swallow whole.     Cholecalciferol (VITAMIN D3) 75 MCG (3000 UT) TABS Take 1 tablet by mouth daily at 6 (six) AM.     ibuprofen (ADVIL) 200 MG tablet Take 400 mg by mouth every 6 (six) hours as needed for moderate pain.     latanoprost  (XALATAN ) 0.005 % ophthalmic solution Place 1 drop into both eyes daily.     letrozole  (FEMARA ) 2.5 MG tablet TAKE 1 TABLET BY MOUTH EVERY DAY 90 tablet 1   nitroGLYCERIN  (NITROSTAT ) 0.4 MG SL tablet Place 1 tablet (0.4 mg total) under the tongue every 5 (five) minutes as needed for chest pain. (Patient not taking: Reported on 10/04/2023) 25 tablet 6   No facility-administered medications prior to visit.    Allergies  Allergen Reactions   Atorvastatin      constipated   Latex Hives    Allergic reaction to condoms years ago.    ROS    See HPI Objective:    Physical Exam Constitutional:      General: She is not in acute distress.    Appearance: Normal appearance. She is well-developed.  HENT:     Head: Normocephalic and atraumatic.     Right Ear: External ear normal.     Left Ear: External ear normal.  Eyes:     General: No scleral icterus. Neck:     Thyroid : No thyromegaly.  Cardiovascular:     Rate and Rhythm: Normal rate and regular rhythm.  Heart sounds: Normal heart sounds. No murmur heard. Pulmonary:     Effort: Pulmonary effort is normal. No respiratory distress.     Breath sounds: Normal breath sounds. No wheezing.  Musculoskeletal:     Cervical back: Neck supple.  Skin:     General: Skin is warm and dry.  Neurological:     Mental Status: She is alert and oriented to person, place, and time.  Psychiatric:        Mood and Affect: Mood normal.        Behavior: Behavior normal.        Thought Content: Thought content normal.        Judgment: Judgment normal.      BP 124/78 (BP Location: Left Arm, Patient Position: Sitting, Cuff Size: Large)   Pulse 75   Temp 98.5 F (36.9 C) (Oral)   Resp 18   Ht 5' 4 (1.626 m)   Wt 232 lb 9.6 oz (105.5 kg)   SpO2 95%   BMI 39.93 kg/m  Wt Readings from Last 3 Encounters:  10/04/23 232 lb 9.6 oz (105.5 kg)  08/01/23 229 lb (103.9 kg)  06/30/23 229 lb 1.3 oz (103.9 kg)       Assessment & Plan:   Problem List Items Addressed This Visit       Unprioritized   Sleep apnea   mild per pt (done around 2015)  no CPAP recommended.  Given weight gain- recommended that we update sleep study.      Relevant Orders   Ambulatory referral to Pulmonology   Right thyroid  nodule   < 1 cm, noted on US  2023- no follow up recommended.      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      Personal history of tobacco use, presenting hazards to health   Counseled on importance of cessation.       Open-angle glaucoma   Follows with optometry, [facility]. Maintained on xalatan .       Nicotine abuse    She smokes one cigarette daily and vapes. Experienced adverse effects with Chantix and removed nicotine patches. Desires to quit smoking but finds it difficult. Discussed various cessation aids and Wellbutrin as an option. - Discuss smoking cessation options including Nicorette gum, patches, lozenges, and inhalation device. - Consider Wellbutrin for smoking cessation if interested.      Morbid obesity (HCC)   Uncontrolled. Refer to [facility].      Relevant Orders   Amb Ref to Medical Weight Management   Mixed dyslipidemia    Lab Results  Component Value Date   CHOL 198 06/09/2021   HDL 74.10 06/09/2021   LDLCALC 112 (H) 06/09/2021   TRIG 64.0 06/09/2021   CHOLHDL 3 06/09/2021   Not on statin.  Update lipid panel.       Relevant Orders   Lipid panel   RESOLVED: Lung mass   Follow up CT clear 11/24.        Hypertension   BP Readings from Last 3 Encounters:  10/04/23 124/78  06/30/23 (!) 151/93  04/05/23 (!) 129/57   BP stable without medication. Monitor.       Hyperglycemia - Primary   Lab Results  Component Value Date   HGBA1C 6.3 04/05/2023   Update A1C, discussed diet/weight loss.       Relevant Orders   HgB A1c   Other Visit Diagnoses  Vitamin D  deficiency       Relevant Orders   Vitamin D  (25 hydroxy)       I am having Jaime [name] maintain her latanoprost , nitroGLYCERIN , aspirin EC, ibuprofen, Vitamin D3, Ascorbic Acid (VITAMIN C ADULT GUMMIES PO), and letrozole .  No orders of the defined types were placed in this encounter.

## 2023-10-04 NOTE — Assessment & Plan Note (Signed)
Counseled on importance of cessation 

## 2023-10-05 ENCOUNTER — Telehealth: Payer: Self-pay | Admitting: Family

## 2023-10-05 ENCOUNTER — Other Ambulatory Visit (HOSPITAL_BASED_OUTPATIENT_CLINIC_OR_DEPARTMENT_OTHER): Payer: Self-pay

## 2023-10-05 DIAGNOSIS — E782 Mixed hyperlipidemia: Secondary | ICD-10-CM

## 2023-10-05 DIAGNOSIS — E559 Vitamin D deficiency, unspecified: Secondary | ICD-10-CM

## 2023-10-05 MED ORDER — VITAMIN D (ERGOCALCIFEROL) 1.25 MG (50000 UNIT) PO CAPS: 50000.0000 [IU] | ORAL_CAPSULE | ORAL | 0 refills | Status: DC

## 2023-10-05 MED ORDER — ROSUVASTATIN CALCIUM 5 MG PO TABS: 5.0000 mg | ORAL_TABLET | Freq: Every day | ORAL | 1 refills | Status: DC

## 2023-10-05 NOTE — Telephone Encounter (Signed)
 Vitamin D  level is low.  Advise patient to begin vit D 50000 units once weekly for 12 weeks, then repeat vit D level (dx Vit D deficiency).    Cholesterol is above goal.  I would recommend that she add crestor  once daily to lower cholesterol and decrease risk of heart attack and stroke. We can repeat her lipids when we check Vit D.  I know she had some constipation with lipitor- let me know if she has any issues with crestor  (I don't think she will though).

## 2023-10-30 ENCOUNTER — Encounter: Payer: Self-pay | Admitting: Hematology & Oncology

## 2023-10-30 ENCOUNTER — Inpatient Hospital Stay: Attending: Hematology & Oncology

## 2023-10-30 ENCOUNTER — Inpatient Hospital Stay (HOSPITAL_BASED_OUTPATIENT_CLINIC_OR_DEPARTMENT_OTHER): Admitting: Hematology & Oncology

## 2023-10-30 VITALS — BP 130/67 | HR 70 | Temp 98.2°F | Resp 20 | Ht 63.25 in | Wt 233.8 lb

## 2023-10-30 DIAGNOSIS — C50911 Malignant neoplasm of unspecified site of right female breast: Secondary | ICD-10-CM

## 2023-10-30 DIAGNOSIS — C50919 Malignant neoplasm of unspecified site of unspecified female breast: Secondary | ICD-10-CM

## 2023-10-30 DIAGNOSIS — E559 Vitamin D deficiency, unspecified: Secondary | ICD-10-CM

## 2023-10-30 DIAGNOSIS — C50011 Malignant neoplasm of nipple and areola, right female breast: Secondary | ICD-10-CM

## 2023-10-30 DIAGNOSIS — Z170 Estrogen receptor positive status [ER+]: Secondary | ICD-10-CM | POA: Insufficient documentation

## 2023-10-30 DIAGNOSIS — Z79899 Other long term (current) drug therapy: Secondary | ICD-10-CM | POA: Insufficient documentation

## 2023-10-30 DIAGNOSIS — Z79811 Long term (current) use of aromatase inhibitors: Secondary | ICD-10-CM | POA: Insufficient documentation

## 2023-10-30 LAB — CBC WITH DIFFERENTIAL (CANCER CENTER ONLY)
Abs Immature Granulocytes: 0 K/uL (ref 0.00–0.07)
Basophils Absolute: 0 K/uL (ref 0.0–0.1)
Basophils Relative: 1 %
Eosinophils Absolute: 0.1 K/uL (ref 0.0–0.5)
Eosinophils Relative: 3 %
HCT: 37 % (ref 36.0–46.0)
Hemoglobin: 12.2 g/dL (ref 12.0–15.0)
Immature Granulocytes: 0 %
Lymphocytes Relative: 47 %
Lymphs Abs: 2.1 K/uL (ref 0.7–4.0)
MCH: 28.9 pg (ref 26.0–34.0)
MCHC: 33 g/dL (ref 30.0–36.0)
MCV: 87.7 fL (ref 80.0–100.0)
Monocytes Absolute: 0.6 K/uL (ref 0.1–1.0)
Monocytes Relative: 14 %
Neutro Abs: 1.6 K/uL — ABNORMAL LOW (ref 1.7–7.7)
Neutrophils Relative %: 35 %
Platelet Count: 233 K/uL (ref 150–400)
RBC: 4.22 MIL/uL (ref 3.87–5.11)
RDW: 13.1 % (ref 11.5–15.5)
WBC Count: 4.5 K/uL (ref 4.0–10.5)
nRBC: 0 % (ref 0.0–0.2)

## 2023-10-30 LAB — CMP (CANCER CENTER ONLY)
ALT: 8 U/L (ref 0–44)
AST: 19 U/L (ref 15–41)
Albumin: 4.3 g/dL (ref 3.5–5.0)
Alkaline Phosphatase: 105 U/L (ref 38–126)
Anion gap: 11 (ref 5–15)
BUN: 11 mg/dL (ref 8–23)
CO2: 26 mmol/L (ref 22–32)
Calcium: 9.6 mg/dL (ref 8.9–10.3)
Chloride: 106 mmol/L (ref 98–111)
Creatinine: 0.71 mg/dL (ref 0.44–1.00)
GFR, Estimated: >60 mL/min (ref >60)
Glucose, Bld: 98 mg/dL (ref 70–99)
Potassium: 4 mmol/L (ref 3.5–5.1)
Sodium: 143 mmol/L (ref 135–145)
Total Bilirubin: 0.4 mg/dL (ref 0.0–1.2)
Total Protein: 6.9 g/dL (ref 6.5–8.1)

## 2023-10-30 LAB — VITAMIN D 25 HYDROXY (VIT D DEFICIENCY, FRACTURES): Vit D, 25-Hydroxy: 57.66 ng/mL (ref 30–100)

## 2023-10-30 NOTE — Progress Notes (Signed)
 [facility] Follow Up Visit  Jaime [name] [id number] Aug 17, 1960 63 y.o. 10/30/2023   Principle Diagnosis:  Stage IIa (T2N0M0) infiltrating ductal carcinoma of the right breast- ER+/PR-/HER2- --  Oncotype = 52  Current Therapy:   Status post right modified radical mastectomy on 03/08/2022 Taxotere /carboplatinum -s/p cycle 5/5 to start on 04/14/2022 -completed on 07/05/2022 Femara  2.5 mg p.o. daily-start 7 years on 08/03/2022     Interim History:  Jaime [name] is back for follow-up.  So far, everything is going pretty well for her.  We saw her back in April.  Since then, she really has had no complaints.  She is still trying to lose a little bit of weight.  She is having a hard time doing this.  I know she is working with her family doctor about this.  She continues on Femara .  We really need to have a bone density test on her.  We have not had a bone density test on her.    She has had no problems with bowels or bladder.  She has had no issues with cough or shortness of breath.  She has had no arthralgias or myalgias.  She has had no headache.  There is been no leg swelling.  She has had no rashes.  There is been no bleeding.  Overall, I would say her performance status is probably ECOG 1.    Medications:  Current Outpatient Medications:    Ascorbic Acid (VITAMIN C ADULT GUMMIES PO), Take by mouth., Disp: , Rfl:    aspirin EC 81 MG tablet, Take 81 mg by mouth daily. Swallow whole., Disp: , Rfl:    ibuprofen (ADVIL) 200 MG tablet, Take 400 mg by mouth every 6 (six) hours as needed for moderate pain., Disp: , Rfl:    latanoprost  (XALATAN ) 0.005 % ophthalmic solution, Place 1 drop into both eyes daily., Disp: , Rfl:    letrozole  (FEMARA ) 2.5 MG tablet, TAKE 1 TABLET BY MOUTH EVERY DAY, Disp: 90 tablet, Rfl: 1   nitroGLYCERIN  (NITROSTAT ) 0.4 MG SL tablet, Place 1 tablet (0.4 mg total) under the tongue every 5 (five) minutes as needed for chest pain., Disp: 25  tablet, Rfl: 6   rosuvastatin  (CRESTOR ) 5 MG tablet, Take 1 tablet (5 mg total) by mouth daily., Disp: 90 tablet, Rfl: 1   Vitamin D , Ergocalciferol , (DRISDOL ) 1.25 MG (50000 UNIT) CAPS capsule, Take 1 capsule (50,000 Units total) by mouth every 7 (seven) days., Disp: 12 capsule, Rfl: 0   Cholecalciferol (VITAMIN D3) 75 MCG (3000 UT) TABS, Take 1 tablet by mouth daily at 6 (six) AM. (Patient not taking: Reported on 10/30/2023), Disp: , Rfl:   Allergies:  Allergies  Allergen Reactions   Atorvastatin      constipated   Latex Hives    Allergic reaction to condoms years ago.    Past Medical History, Surgical history, Social history, and Family History were reviewed and updated.  Review of Systems: Review of Systems  Constitutional: Negative.   HENT:  Negative.    Eyes: Negative.   Respiratory: Negative.    Cardiovascular: Negative.   Gastrointestinal: Negative.   Endocrine: Negative.   Genitourinary: Negative.    Musculoskeletal: Negative.   Skin: Negative.   Neurological: Negative.   Hematological: Negative.   Psychiatric/Behavioral: Negative.      Physical Exam:  height is 5' 3.25 (1.607 m) and weight is 233 lb 12.8 oz (106.1 kg). Her oral temperature is 98.2 F (36.8 C). Her blood pressure  is 130/67 and her pulse is 70. Her respiration is 20 and oxygen saturation is 100%.   Wt Readings from Last 3 Encounters:  10/30/23 233 lb 12.8 oz (106.1 kg)  10/04/23 232 lb 9.6 oz (105.5 kg)  08/01/23 229 lb (103.9 kg)    Physical Exam Vitals reviewed.  Constitutional:      Comments: Chest wall exam shows left chest wall with mastectomy.  She has no chest wall nodules.  There is no left axillary adenopathy.  Right chest wall shows the healing mastectomy.  She has little bit of fluctuance in the lateral aspect of the mastectomy.  It is nontender.  There is some hyperpigmentation from past radiation.  There is no nipple discharge.  There is no erythema.    There is no right axillary  adenopathy.  HENT:     Head: Normocephalic and atraumatic.  Eyes:     Pupils: Pupils are equal, round, and reactive to light.  Cardiovascular:     Rate and Rhythm: Normal rate and regular rhythm.     Heart sounds: Normal heart sounds.  Pulmonary:     Effort: Pulmonary effort is normal.     Breath sounds: Normal breath sounds.  Abdominal:     General: Bowel sounds are normal.     Palpations: Abdomen is soft.  Musculoskeletal:        General: No tenderness or deformity. Normal range of motion.     Cervical back: Normal range of motion.  Lymphadenopathy:     Cervical: No cervical adenopathy.  Skin:    General: Skin is warm and dry.     Findings: No erythema or rash.  Neurological:     Mental Status: She is alert and oriented to person, place, and time.  Psychiatric:        Behavior: Behavior normal.        Thought Content: Thought content normal.        Judgment: Judgment normal.      Lab Results  Component Value Date   WBC 4.5 10/30/2023   HGB 12.2 10/30/2023   HCT 37.0 10/30/2023   MCV 87.7 10/30/2023   PLT 233 10/30/2023     Chemistry      Component Value Date/Time   NA 143 10/30/2023 0924   NA 143 02/16/2022 1006   K 4.0 10/30/2023 0924   CL 106 10/30/2023 0924   CO2 26 10/30/2023 0924   BUN 11 10/30/2023 0924   BUN 13 02/16/2022 1006   CREATININE 0.71 10/30/2023 0924      Component Value Date/Time   CALCIUM  9.6 10/30/2023 0924   ALKPHOS 105 10/30/2023 0924   AST 19 10/30/2023 0924   ALT 8 10/30/2023 0924   BILITOT 0.4 10/30/2023 0924      Impression and Plan: Jaime [name] is a very charming 63 year old postmenopausal African-American female.  This is her third breast cancer.  I do not believe this is a recurrence.  I believe this is a new breast cancer.  I am not sure as to why she keeps having breast cancer.  I think she underwent genetic testing.  I think the genetic testing was negative.  I would like to get her back in 4 months.  If all is good  in 4 months, then we can move everything to 6 months.  We will need to get a bone density test on her.  I think this would be a good idea as a baseline.   [name], MD  8/4/202510:11 AM

## 2023-10-31 LAB — CANCER ANTIGEN 27.29: CA 27.29: 15 U/mL (ref 0.0–38.6)

## 2023-11-06 DIAGNOSIS — H40003 Preglaucoma, unspecified, bilateral: Secondary | ICD-10-CM | POA: Diagnosis not present

## 2023-11-23 ENCOUNTER — Other Ambulatory Visit (HOSPITAL_BASED_OUTPATIENT_CLINIC_OR_DEPARTMENT_OTHER)

## 2023-12-04 ENCOUNTER — Ambulatory Visit (HOSPITAL_BASED_OUTPATIENT_CLINIC_OR_DEPARTMENT_OTHER)
Admission: RE | Admit: 2023-12-04 | Discharge: 2023-12-04 | Disposition: A | Discharge status: Home / Self Care | Source: Ambulatory Visit | Attending: Hematology & Oncology | Admitting: Hematology & Oncology

## 2023-12-04 ENCOUNTER — Telehealth: Payer: Self-pay | Admitting: Family

## 2023-12-04 ENCOUNTER — Ambulatory Visit: Payer: Self-pay | Admitting: Hematology & Oncology

## 2023-12-04 DIAGNOSIS — C50911 Malignant neoplasm of unspecified site of right female breast: Secondary | ICD-10-CM | POA: Diagnosis not present

## 2023-12-04 DIAGNOSIS — C50011 Malignant neoplasm of nipple and areola, right female breast: Secondary | ICD-10-CM | POA: Diagnosis not present

## 2023-12-04 DIAGNOSIS — Z79811 Long term (current) use of aromatase inhibitors: Secondary | ICD-10-CM | POA: Diagnosis not present

## 2023-12-04 DIAGNOSIS — Z170 Estrogen receptor positive status [ER+]: Secondary | ICD-10-CM | POA: Diagnosis not present

## 2023-12-04 DIAGNOSIS — Z780 Asymptomatic menopausal state: Secondary | ICD-10-CM | POA: Insufficient documentation

## 2023-12-04 DIAGNOSIS — Z13820 Encounter for screening for osteoporosis: Secondary | ICD-10-CM | POA: Insufficient documentation

## 2023-12-04 DIAGNOSIS — C50919 Malignant neoplasm of unspecified site of unspecified female breast: Secondary | ICD-10-CM | POA: Insufficient documentation

## 2023-12-04 NOTE — Telephone Encounter (Signed)
 Pt says she is feeling dizzy, lightheaded, nauseous and has a funny taste in her mouth. She wants to be seen or called as soon as we can.

## 2023-12-05 ENCOUNTER — Encounter (INDEPENDENT_AMBULATORY_CARE_PROVIDER_SITE_OTHER): Payer: Self-pay | Admitting: Adult Health

## 2023-12-05 ENCOUNTER — Ambulatory Visit (INDEPENDENT_AMBULATORY_CARE_PROVIDER_SITE_OTHER): Admitting: Adult Health

## 2023-12-05 VITALS — BP 128/66 | HR 81 | Temp 98.4°F | Ht 62.0 in | Wt 228.0 lb

## 2023-12-05 DIAGNOSIS — E559 Vitamin D deficiency, unspecified: Secondary | ICD-10-CM

## 2023-12-05 DIAGNOSIS — R739 Hyperglycemia, unspecified: Secondary | ICD-10-CM | POA: Diagnosis not present

## 2023-12-05 DIAGNOSIS — C50911 Malignant neoplasm of unspecified site of right female breast: Secondary | ICD-10-CM | POA: Diagnosis not present

## 2023-12-05 DIAGNOSIS — Z6841 Body Mass Index (BMI) 40.0 and over, adult: Secondary | ICD-10-CM

## 2023-12-05 NOTE — Telephone Encounter (Signed)
 Pt called back , stated she is still dizzy but feels a little bit better stated she laid in the bed all day yesterday. Offered her an appointment stated she would rather wait on pcp to return to office , appointment made for 9/12/ and pt also advised if sx worsen to go to ED

## 2023-12-05 NOTE — Progress Notes (Signed)
 Office: [phone]  /  Fax: [phone]   Initial Visit    Jaime [name] was seen in clinic today to evaluate for obesity. She is interested in losing weight to improve overall health and reduce the risk of weight related complications. She presents today to review program treatment options, initial physical assessment, and evaluation.     She was referred by: PCP  When asked what else they would like to accomplish? She states: Adopt a healthier eating pattern and lifestyle, Improve energy levels and physical activity, Improve existing medical conditions, Improve quality of life, and Goal weight 190 lbs, current weight 228 lbs  When asked how has your weight affected you? She states: Contributed to medical problems, Contributed to orthopedic problems or mobility issues, Having fatigue, Having poor endurance, and Problems with eating patterns  Weight history: Weight gain since second breast cancer in 2015  Highest weight: 235 lbs  Some associated conditions: Hypertension, Vitamin D  Deficiency, and Other: Hyperglycemia  Contributing factors: disruption of circadian rhythm / sleep disordered breathing, consumption of processed foods, reduced physical activity, chronic skipping of meals, mental health problems, hectic pace of life, and need for convenient foods  Weight promoting medications identified: None  Prior weight loss attempts: Low Carb  Current nutrition plan: Low-carb  Current level of physical activity: Other: Chair Exercises- intermittently  Current or previous pharmacotherapy: None  Response to medication: Never tried medications   Past medical history includes:   Past Medical History:  Diagnosis Date   Acute vaginitis 10/14/2022   Aortic atherosclerosis (HCC) 03/13/2020   Atypical chest pain 02/13/2019   Breast CA (HCC) 03/08/2022   Breast cancer (HCC)    BIL  mastectomy left    lumpectomy right   DOE (dyspnea on exertion) 12/29/2021   Ductal  carcinoma (HCC) 02/01/2022   Emphysema lung (HCC) 03/13/2020   Emphysema lung (HCC)    Genetic testing 03/03/2022   Negative genetic testing on the CancerNext-Expanded+RNAinsight panel.  The report date is March 02, 2022.     The CancerNext-Expanded gene panel offered by 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   Hx of breast cancer 02/13/2019   Right (2004) pT1cN1 cM0 triple negative s/p lumpectomy and ALND with adjuvant ddAc-T and radiation Left (2018) - Stage IIA cT2N0M0 ER-/PR-/Her2+ and Stage IA CT1CN0 ER-/PR-/Her2- both ypT1aN0(sn) grade 3 - s/p 6C neoadjuvant chemo, and mastectomy and SLNB on 09/15/2017 (records scanned)   Hyperglycemia 10/14/2022   Hypertension 10/14/2022   Loose stools 10/03/2022   Lower extremity edema 10/03/2022   Lung mass 02/13/2019   Mixed dyslipidemia 02/03/2022   Open-angle glaucoma 02/13/2019   Personal history of chemotherapy    Personal history of radiation therapy    Personal history of tobacco use, presenting hazards to health 03/12/2019   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  Body Fat%:49.0, Visceral Fat Rating:16, Weight trend over the last 12  months: Decreasing  General:  Alert, oriented and cooperative. Patient is in no acute distress.  Respiratory: Normal respiratory effort, no problems with respiration noted   Gait: able to ambulate independently  Mental Status: Normal mood and affect. Normal behavior. Normal judgment and thought content.   DIAGNOSTIC DATA REVIEWED:  BMET    Component Value Date/Time   NA 143 10/30/2023 0924   NA 143 02/16/2022 1006   K 4.0 10/30/2023 0924   CL 106 10/30/2023 0924   CO2 26 10/30/2023 0924   GLUCOSE 98 10/30/2023 0924   BUN 11 10/30/2023 0924   BUN 13 02/16/2022 1006   CREATININE 0.71 10/30/2023 0924   CALCIUM  9.6 10/30/2023 0924   GFRNONAA >60 10/30/2023 0924   GFRAA >60 03/06/2019 1144   Lab Results  Component Value Date   HGBA1C 6.1 10/04/2023   HGBA1C 6.3 04/05/2023   No results found for: INSULIN CBC    Component Value Date/Time   WBC 4.5 10/30/2023 0924   WBC 7.6 03/09/2022 0249   RBC 4.22 10/30/2023 0924   HGB 12.2 10/30/2023 0924   HCT 37.0 10/30/2023 0924   PLT 233 10/30/2023 0924   MCV 87.7 10/30/2023 0924   MCH 28.9 10/30/2023 0924   MCHC 33.0 10/30/2023 0924   RDW 13.1 10/30/2023 0924   Iron/TIBC/Ferritin/ %Sat No results found for: IRON, TIBC, FERRITIN, IRONPCTSAT Lipid Panel     Component Value Date/Time   CHOL 228 (H) 10/04/2023 0959   TRIG 88.0 10/04/2023 0959   HDL 78.20 10/04/2023 0959   CHOLHDL 3 10/04/2023 0959   VLDL 17.6 10/04/2023 0959   LDLCALC 132 (H) 10/04/2023 0959   Hepatic Function Panel     Component Value Date/Time   PROT 6.9 10/30/2023 0924   ALBUMIN 4.3 10/30/2023 0924   AST 19 10/30/2023 0924   ALT 8 10/30/2023 0924   ALKPHOS 105 10/30/2023 0924   BILITOT 0.4 10/30/2023 0924   No results found for: TSH   Assessment and Plan   Vitamin D  deficiency  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 Will work on eliminating or reducing the presence of highly palatable, calorie dense foods in the home. Will complete provided nutritional and psychosocial assessment questionnaire before the next appointment. Will be scheduled for indirect calorimetry to determine resting energy expenditure in a fasting state.  This will allow us  to create a reduced calorie, high-protein meal plan to promote loss of fat mass while preserving muscle mass. Counseled on the health benefits of losing 5%-15% of total body weight. Was counseled on nutritional approaches to weight loss and benefits of reducing processed foods and consuming plant-based foods and high quality protein as part of nutritional weight management. Was counseled on pharmacotherapy and role as an adjunct in weight management.   Obesity Education Performed Today:  She was weighed on the  bioimpedance scale and results were discussed and documented in the synopsis.  We discussed obesity as a disease and the importance of a more detailed evaluation of all the factors contributing to the disease.  We discussed the importance of long term lifestyle changes which include nutrition, exercise and behavioral modifications as well as the importance of customizing this to her specific health and social needs.  We discussed the benefits of reaching a healthier weight to alleviate the symptoms of existing conditions and reduce the risks of the biomechanical, metabolic and psychological effects of obesity.  We reviewed the four pillars of obesity medicine and importance of using a multimodal approach.  We reviewed the basic principles in weight management.   Rilya C Brent appears to be in the action stage of change and states they are ready to start intensive lifestyle modifications and behavioral  modifications.  I have spent 25 minutes in the care of the patient today including: 2 minutes before the visit reviewing and preparing the chart. 20 minutes face-to-face assessing and reviewing listed medical problems as outlined in obesity care plan, providing nutritional and behavioral counseling on topics outlined in the obesity care plan, counseling regarding anti-obesity medication as outlined in obesity care plan, independently interpreting test results and goals of care, as described in assessment and plan, reviewing and discussing biometric information and progress, and reviewing latest PCP notes and specialist consultations 3 minutes after the visit updating chart and documentation of encounter.  Reviewed by clinician on day of visit: allergies, medications, problem list, medical history, surgical history, family history, social history, and previous encounter notes pertinent to obesity diagnosis.  Jameria Bradway d. Maxten Shuler, NP-C

## 2023-12-05 NOTE — Telephone Encounter (Signed)
 Pt called and lvm to return call , calling to check status of patient

## 2023-12-08 ENCOUNTER — Other Ambulatory Visit (HOSPITAL_BASED_OUTPATIENT_CLINIC_OR_DEPARTMENT_OTHER): Payer: Self-pay

## 2023-12-08 ENCOUNTER — Ambulatory Visit (INDEPENDENT_AMBULATORY_CARE_PROVIDER_SITE_OTHER): Admitting: Family

## 2023-12-08 VITALS — BP 135/69 | HR 78 | Temp 98.5°F | Resp 16 | Ht 62.0 in | Wt 234.0 lb

## 2023-12-08 DIAGNOSIS — R0789 Other chest pain: Secondary | ICD-10-CM | POA: Diagnosis not present

## 2023-12-08 DIAGNOSIS — H8113 Benign paroxysmal vertigo, bilateral: Secondary | ICD-10-CM | POA: Diagnosis not present

## 2023-12-08 MED ORDER — MECLIZINE HCL 12.5 MG PO TABS: 12.5000 mg | ORAL_TABLET | Freq: Three times a day (TID) | ORAL | 0 refills | Status: AC | PRN

## 2023-12-08 MED ORDER — SHINGRIX 50 MCG/0.5ML IM SUSR
0.5000 mL | Freq: Once | INTRAMUSCULAR | 0 refills | Status: AC
  Filled 2023-12-08: qty 0.5, 1d supply, fill #0

## 2023-12-08 NOTE — Patient Instructions (Signed)
 VISIT SUMMARY:  You visited us  today because you have been experiencing dizziness for almost a week. We discussed your symptoms and possible causes, and we have a plan to help you feel better.  YOUR PLAN:  BENIGN PAROXYSMAL POSITIONAL VERTIGO (BPPV): Your dizziness might be due to a condition called BPPV, which is caused by tiny crystals in your inner ear being out of place. -Take the prescribed medication if your dizziness becomes severe. -Monitor your symptoms for the next 1-2 weeks. -If your symptoms do not improve, we may refer you to physical therapy.  ATYPICAL CHEST PAIN: You used nitroglycerin  twice last month for severe spasms, which were not related to a heart attack. -Schedule a follow-up appointment with your cardiologist, Dr. Edwyna.

## 2023-12-08 NOTE — Assessment & Plan Note (Signed)
 New. Trial of meclizine . If symptoms are not improved in 2 weeks, I have advised her to let me know and we will refer for PT/Vestibular Rehab.

## 2023-12-08 NOTE — Progress Notes (Signed)
 Subjective:     Patient ID: Jaime [name], female    DOB: 1961-01-23, 63 y.o.   MRN: [id number]  Chief Complaint  Patient presents with   Dizziness    Patient complains of getting dizzy when standing or sitting    HPI  Discussed the use of AI scribe software for clinical note transcription with the patient, who gave verbal consent to proceed.  History of Present Illness  Jaime [name] is a 63 year old female who presents with dizziness.  Dizziness began almost a week ago while she was at her daughter's house, occurring when she stood up to go to the bathroom. It is described as a 'woozy' feeling without a spinning sensation. A metallic taste in her mouth was noted on the second or third day. Mild nausea occurred on the second day but not initially. She stays hydrated with two cups of coffee and at least three 16-ounce bottles of water daily. There is no dizziness associated with head movement. She regularly wears glasses, which she initially considered as a cause, but has used them without prior issues. In the past month, she used nitroglycerin  twice for severe spasms on the left side of her chest.  She did not think that this was due to a heart attack but did note that pain improved following nitroglycerine.      Health Maintenance Due  Topic Date Due   Pneumococcal Vaccine: 50+ Years (1 of 2 - PCV) Never done   COVID-19 Vaccine (3 - Pfizer risk series) 08/26/2019    Past Medical History:  Diagnosis Date   Acute vaginitis 10/14/2022   Aortic atherosclerosis (HCC) 03/13/2020   Atypical chest pain 02/13/2019   Breast CA (HCC) 03/08/2022   Breast cancer (HCC)    BIL  mastectomy left    lumpectomy right   DOE (dyspnea on exertion) 12/29/2021   Ductal carcinoma (HCC) 02/01/2022   Emphysema lung (HCC) 03/13/2020   Emphysema lung (HCC)    Genetic testing 03/03/2022   Negative genetic testing on the CancerNext-Expanded+RNAinsight panel.  The report date is March 02, 2022.     The CancerNext-Expanded gene panel offered by 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   Hx of breast cancer 02/13/2019   Right (2004) pT1cN1 cM0 triple negative s/p lumpectomy and ALND with adjuvant ddAc-T and radiation Left (2018) - Stage IIA cT2N0M0 ER-/PR-/Her2+ and Stage IA CT1CN0 ER-/PR-/Her2- both ypT1aN0(sn) grade 3 - s/p 6C neoadjuvant chemo, and mastectomy and SLNB on 09/15/2017 (records scanned)   Hyperglycemia 10/14/2022   Hypertension 10/14/2022   Loose stools 10/03/2022   Lower extremity edema 10/03/2022   Lung mass 02/13/2019   Mixed dyslipidemia 02/03/2022   Open-angle glaucoma 02/13/2019   Personal history of chemotherapy    Personal history of radiation therapy    Personal history of tobacco use, presenting hazards to health 03/12/2019   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) 03/08/2022   Right thyroid  nodule    S/P left mastectomy 02/13/2019   Sleep apnea    mild per pt, no  CPAP recommended   Surgical wound infection 04/01/2022   Tobacco use 02/13/2019    Past Surgical History:  Procedure Laterality Date   BREAST BIOPSY Left 2019   BREAST BIOPSY Right 01/31/2022   MM RT BREAST BX W LOC DEV 1ST LESION IMAGE BX SPEC STEREO GUIDE 01/31/2022 [facility]   BREAST LUMPECTOMY Right 2004   triple Neg breast CA   DILATION AND CURETTAGE OF UTERUS  1985   MASTECTOMY Left 08/2017   TOTAL MASTECTOMY Right 03/08/2022   Procedure: RIGHT  MASTECTOMY;  Surgeon: Belinda Cough, MD;  Location: [facility] OR;  Service: General;  Laterality: Right;  PEC BLOCK   TUBAL  LIGATION  1991   UNILATERAL SALPINGECTOMY  1985    Family History  Problem Relation Age of Onset   Alcohol abuse Mother    Lung cancer Mother    Esophageal cancer Mother    Throat cancer Mother    Alcohol abuse Father    Arthritis Father    Kidney disease Father    Dementia Father    Arthritis Sister    Diabetes Sister    Arthritis Brother    Hypertension Brother    Sickle cell anemia Brother    HIV Brother    Heart attack Brother 109       massive MI   Thyroid  disease Brother     Social History   Socioeconomic History   Marital status: Married    Spouse name: Not on file   Number of children: 2   Years of education: associates   Highest education level: Associate degree: academic program  Occupational History   Not on file  Tobacco Use   Smoking status: Every Day    Current packs/day: 0.25    Average packs/day: 0.3 packs/day for 42.0 years (10.5 ttl pk-yrs)    Types: Cigarettes   Smokeless tobacco: Never  Vaping Use   Vaping status: Some Days   Substances: Nicotine  Substance and Sexual Activity   Alcohol use: Not Currently    Comment: quit in 1999   Drug use: Not Currently    Comment: quit in 1996-street drugs (coccaine, marijuana)   Sexual activity: Not Currently  Other Topics Concern   Not on file  Social History Narrative   02/13/19   From: Born in [place]  but grew up in [place]   Living: with husband   Work: currently on disability      Family: Daughter - Social worker (biological daughter) Son - Carliss (biological son) has one step son       Enjoys: walking, picnics, bowling, reading, playing cards      Exercise: walking - tries to do everyday - uses fitbit and tries to get 5000 steps   Diet: veggies, water, 2-3 cups of coffee, trying to monitor what she eats      Safety   Seat belts: Yes    Guns: No   Safe in relationships: Yes    Social Drivers of Corporate investment banker Strain: Medium Risk (09/27/2023)   Overall Financial Resource Strain  (CARDIA)    Difficulty of Paying Living Expenses: Somewhat hard  Food Insecurity: Food Insecurity Present (09/27/2023)   Hunger Vital Sign    Worried About Running Out of Food in the Last Year: Sometimes true    Ran Out of Food in the Last Year: Never true  Transportation Needs: No Transportation Needs (09/27/2023)   PRAPARE - Transportation    Lack of Transportation (Medical): No    Lack  of Transportation (Non-Medical): No  Physical Activity: Inactive (09/27/2023)   Exercise Vital Sign    Days of Exercise per Week: 0 days    Minutes of Exercise per Session: Not on file  Stress: No Stress Concern Present (09/27/2023)   [employer] of Occupational Health - Occupational Stress Questionnaire    Feeling of Stress: Only a little  Social Connections: Socially Integrated (09/27/2023)   Social Connection and Isolation Panel    Frequency of Communication with Friends and Family: More than three times a week    Frequency of Social Gatherings with Friends and Family: More than three times a week    Attends Religious Services: More than 4 times per year    Active Member of [employer] or Organizations: Yes    Attends Engineer, structural: More than 4 times per year    Marital Status: Married  Recent Concern: Social Connections - Moderately Isolated (08/01/2023)   Social Connection and Isolation Panel    Frequency of Communication with Friends and Family: More than three times a week    Frequency of Social Gatherings with Friends and Family: More than three times a week    Attends Religious Services: Never    Database administrator or Organizations: No    Attends Banker Meetings: Never    Marital Status: Married  Catering manager Violence: Not At Risk (08/01/2023)   Humiliation, Afraid, Rape, and Kick questionnaire    Fear of Current or Ex-Partner: No    Emotionally Abused: No    Physically Abused: No    Sexually Abused: No    Outpatient Medications Prior to Visit  Medication  Sig Dispense Refill   Ascorbic Acid (VITAMIN C ADULT GUMMIES PO) Take by mouth.     aspirin EC 81 MG tablet Take 81 mg by mouth daily. Swallow whole.     Cholecalciferol (VITAMIN D3) 75 MCG (3000 UT) TABS Take 1 tablet by mouth daily at 6 (six) AM.     ibuprofen (ADVIL) 200 MG tablet Take 400 mg by mouth every 6 (six) hours as needed for moderate pain.     latanoprost  (XALATAN ) 0.005 % ophthalmic solution Place 1 drop into both eyes daily.     letrozole  (FEMARA ) 2.5 MG tablet TAKE 1 TABLET BY MOUTH EVERY DAY 90 tablet 1   nitroGLYCERIN  (NITROSTAT ) 0.4 MG SL tablet Place 1 tablet (0.4 mg total) under the tongue every 5 (five) minutes as needed for chest pain. 25 tablet 6   rosuvastatin  (CRESTOR ) 5 MG tablet Take 1 tablet (5 mg total) by mouth daily. 90 tablet 1   Vitamin D , Ergocalciferol , (DRISDOL ) 1.25 MG (50000 UNIT) CAPS capsule Take 1 capsule (50,000 Units total) by mouth every 7 (seven) days. 12 capsule 0   Zoster Vaccine Adjuvanted (SHINGRIX ) injection Inject 0.5 mLs into the muscle once for 1 dose. 0.5 mL 0   No facility-administered medications prior to visit.    Allergies  Allergen Reactions   Atorvastatin      constipated   Latex Hives    Allergic reaction to condoms years ago.    ROS See HPI    Objective:    Physical Exam Constitutional:      General: She is not in acute distress.    Appearance: Normal appearance. She is well-developed.  HENT:     Head: Normocephalic and atraumatic.     Right Ear: External ear normal.     Left Ear: External ear normal.  Eyes:  General: No scleral icterus. Neck:     Thyroid : No thyromegaly.  Cardiovascular:     Rate and Rhythm: Normal rate and regular rhythm.     Heart sounds: Normal heart sounds. No murmur heard. Pulmonary:     Effort: Pulmonary effort is normal. No respiratory distress.     Breath sounds: Normal breath sounds. No wheezing.  Musculoskeletal:     Cervical back: Neck supple.  Skin:    General: Skin is warm  and dry.  Neurological:     Mental Status: She is alert and oriented to person, place, and time.     Cranial Nerves: Cranial nerves 2-12 are intact.     Comments: + [employer], mild bilaterally.   Psychiatric:        Mood and Affect: Mood normal.        Behavior: Behavior normal.        Thought Content: Thought content normal.        Judgment: Judgment normal.      BP 135/69 (BP Location: Right Arm, Patient Position: Sitting, Cuff Size: Large)   Pulse 78   Temp 98.5 F (36.9 C) (Oral)   Resp 16   Ht 5' 2 (1.575 m)   Wt 234 lb (106.1 kg)   SpO2 99%   BMI 42.80 kg/m  Wt Readings from Last 3 Encounters:  12/08/23 234 lb (106.1 kg)  12/05/23 228 lb (103.4 kg)  10/30/23 233 lb 12.8 oz (106.1 kg)       Assessment & Plan:   Problem List Items Addressed This Visit       Unprioritized   Benign paroxysmal positional vertigo due to bilateral vestibular disorder - Primary   New. Trial of meclizine . If symptoms are not improved in 2 weeks, I have advised her to let me know and we will refer for PT/Vestibular Rehab.       Relevant Medications   meclizine  (ANTIVERT ) 12.5 MG tablet   Atypical chest pain   Advised pt to schedule a follow up visit with cardiology.        I am having Cyrene C. Lemieux start on meclizine . I am also having her maintain her latanoprost , nitroGLYCERIN , aspirin EC, ibuprofen, Vitamin D3, Ascorbic Acid (VITAMIN C ADULT GUMMIES PO), letrozole , Vitamin D  (Ergocalciferol ), rosuvastatin , and Shingrix .  Meds ordered this encounter  Medications   meclizine  (ANTIVERT ) 12.5 MG tablet    Sig: Take 1 tablet (12.5 mg total) by mouth 3 (three) times daily as needed for dizziness.    Dispense:  30 tablet    Refill:  0    Supervising Provider:   DOMENICA BLACKBIRD A [[id number]]

## 2023-12-08 NOTE — Assessment & Plan Note (Signed)
 Advised pt to schedule a follow up visit with cardiology.

## 2023-12-26 ENCOUNTER — Ambulatory Visit: Admitting: Nurse Practitioner

## 2023-12-26 ENCOUNTER — Encounter: Payer: Self-pay | Admitting: Nurse Practitioner

## 2023-12-26 VITALS — BP 153/84 | HR 77 | Temp 98.2°F | Ht 63.0 in | Wt 232.0 lb

## 2023-12-26 DIAGNOSIS — Z6841 Body Mass Index (BMI) 40.0 and over, adult: Secondary | ICD-10-CM | POA: Diagnosis not present

## 2023-12-26 DIAGNOSIS — G4719 Other hypersomnia: Secondary | ICD-10-CM

## 2023-12-26 DIAGNOSIS — R0683 Snoring: Secondary | ICD-10-CM

## 2023-12-26 NOTE — Progress Notes (Signed)
 @Patient  ID: Jaime [name], female    DOB: [birth year], 63 y.o.   MRN: [id number]  Chief Complaint  Patient presents with   Consult    Sleep consult    Referring provider: Daryl Setter, NP  HPI: 63 year old female, active smoker referred for sleep consult. Past medical history significant for HTN, BPH, hx of breast cancer s/p mastectomy and concurrent chemo/radiation, HLD, hx of substance abuse.  TEST/EVENTS:   12/26/2023: Today - sleep consult Discussed the use of AI scribe software for clinical note transcription with the patient, who gave verbal consent to proceed.  History of Present Illness Jaime [name] is a 63 year old female with mild sleep apnea who presents for a sleep consult.  Her symptoms have remained stable since her last sleep study conducted approximately 12-13 years ago, which indicated mild sleep apnea. She has never been on CPAP therapy. Her husband has observed episodes of apnea during sleep, although she does not wake up choking or gasping for air. She occasionally wakes herself up snoring and feels tired during the day. She wakes up at night to use the bathroom and reports drinking a lot of water, but does not experience dry mouth at night. She does not take any sleep medications. No sleep parasomnias or bruxism. Occasional morning headaches.   No issues with drowsy driving. She has not been to the dentist since 2018 due to a breast cancer diagnosis in 2019, followed by chemotherapy and surgery. In 2023, she underwent another round of treatment after a recurrence. She notes that a couple of her teeth have fallen out after undergoing chemotherapy. She's planning to get in with one.   She consumes about two 12-ounce cups of coffee daily, only in the morning, and is mindful of her caffeine intake. No alcohol intake. She is an active smoker.   She goes to bed around 9-10 pm. Falls asleep within 10 min. Wakes 2-3 times a night. Gets up around 6 am.  She is retired. Does  not operate any other heavy machinery. Weight is up 20 lb in last 2 years. Lives with her sons and husband.      12/26/2023    8:00 AM  Results of the Epworth flowsheet  Sitting and reading 2  Watching TV 1  Sitting, inactive in a public place (e.g. a theatre or a meeting) 0  As a passenger in a car for an hour without a break 0  Lying down to rest in the afternoon when circumstances permit 2  Sitting and talking to someone 0  Sitting quietly after a lunch without alcohol 1  In a car, while stopped for a few minutes in traffic 0  Total score 6       Allergies  Allergen Reactions   Atorvastatin      constipated   Latex Hives    Allergic reaction to condoms years ago.    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  The report date is March 02, 2022.     The CancerNext-Expanded gene panel offered by [facility] and includes sequencing and rearrangement analysis for the following 77 genes: AIP, ALK, APC*, ATM*, AXIN2, BAP1, BARD1, BLM, BMPR1A, BRCA1*, BRCA2*, BRIP1*, CDC73, CDH1*, CDK4, CDKN1B, CDKN2A, CHEK2*, CTNNA1, DICER1, FANCC, FH   Glaucoma    Heart murmur    ECHO scheduled 03/04/22   History of alcohol abuse    no drinking since 1999   History of drug abuse (HCC)    1996- last drug use- been clean since then   Hx of breast  cancer 02/13/2019   Right (2004) pT1cN1 cM0 triple negative s/p lumpectomy and ALND with adjuvant ddAc-T and radiation Left (2018) - Stage IIA cT2N0M0 ER-/PR-/Her2+ and Stage IA CT1CN0 ER-/PR-/Her2- both ypT1aN0(sn) grade 3 - s/p 6C neoadjuvant chemo, and mastectomy and SLNB on 09/15/2017 (records scanned)   Hyperglycemia 10/14/2022   Hypertension 10/14/2022   Loose stools 10/03/2022   Lower extremity edema 10/03/2022   Lung mass 02/13/2019   Mixed dyslipidemia 02/03/2022   Open-angle glaucoma 02/13/2019   Personal history of chemotherapy    Personal history of radiation therapy    Personal history of tobacco use, presenting hazards to health 03/12/2019   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) 03/08/2022   Right thyroid  nodule    S/P left mastectomy 02/13/2019   Sleep apnea    mild per pt, no CPAP recommended   Surgical wound infection 04/01/2022   Tobacco use 02/13/2019    Tobacco History: Social History   Tobacco Use  Smoking Status Every Day   Current packs/day: 0.25   Average packs/day: 0.3 packs/day for 42.0 years (10.5 ttl pk-yrs)   Types: Cigarettes  Smokeless Tobacco Never  Tobacco Comments   3 cigarettes a day and a vape 12/26/2023 KRD   Ready to quit: Not Answered Counseling given: Not Answered Tobacco comments: 3 cigarettes a day and a vape 12/26/2023 KRD   Outpatient Medications Prior to Visit  Medication Sig Dispense Refill   Ascorbic Acid (VITAMIN C ADULT GUMMIES PO) Take by mouth.     aspirin EC 81 MG tablet Take 81 mg by mouth daily. Swallow whole.     ibuprofen (ADVIL) 200 MG tablet Take 400 mg by mouth every 6 (six) hours as needed for moderate pain.     latanoprost  (XALATAN ) 0.005 % ophthalmic solution Place 1 drop into both eyes daily.     letrozole  (FEMARA ) 2.5 MG tablet TAKE 1 TABLET BY MOUTH EVERY DAY 90 tablet 1   meclizine  (ANTIVERT ) 12.5 MG tablet Take 1 tablet (12.5 mg total) by mouth 3 (three) times daily as needed for  dizziness. 30 tablet 0   nitroGLYCERIN  (NITROSTAT ) 0.4 MG SL tablet Place 1 tablet (0.4 mg total) under the tongue every 5 (five) minutes as needed for chest pain. 25 tablet 6   rosuvastatin  (CRESTOR ) 5 MG tablet Take 1 tablet (5 mg total) by mouth daily. 90 tablet 1   Vitamin D , Ergocalciferol , (DRISDOL ) 1.25 MG (50000 UNIT) CAPS capsule Take 1 capsule (50,000 Units total) by mouth every 7 (seven) days. 12 capsule 0   Cholecalciferol (VITAMIN D3) 75 MCG (3000 UT) TABS Take 1 tablet by mouth daily at 6 (six) AM. (Patient not taking: Reported on 12/26/2023)     No facility-administered medications prior to visit.     Review of Systems: As above    Physical Exam:  BP (!) 153/84   Pulse 77   Temp 98.2 F (36.8 C) (Oral)   Ht 5' 3 (1.6 m)   Wt 232 lb (105.2 kg)   SpO2 97%   BMI 41.10 kg/m   GEN: Pleasant, interactive, well-appearing; morbidly obese; in no acute distress HEENT:  Normocephalic and atraumatic. PERRLA. Sclera white. Nasal turbinates pink, moist and patent bilaterally. No rhinorrhea present. Oropharynx pink and moist, without exudate or edema. No lesions, ulcerations, or postnasal drip. Mallampati III NECK:  Supple w/ fair ROM. No thyromegaly  CV: RRR, no m/r/g PULMONARY:  Unlabored, regular breathing. Clear bilaterally A&P w/o wheezes/rales/rhonchi. No accessory muscle use.  GI: BS present and normoactive. Soft, non-tender to palpation. MSK: Cap refil <2 sec all extrem.  Neuro: A/Ox3. No focal deficits noted.   Skin: Warm, no lesions or rashe Psych: Normal affect and behavior. Judgement and thought content appropriate.     Lab Results:  CBC    Component Value Date/Time   WBC 4.5 10/30/2023 0924   WBC 7.6 03/09/2022 0249   RBC 4.22 10/30/2023 0924   HGB 12.2 10/30/2023 0924   HCT 37.0 10/30/2023 0924   PLT 233 10/30/2023 0924   MCV 87.7 10/30/2023 0924   MCH 28.9 10/30/2023 0924   MCHC 33.0 10/30/2023 0924   RDW 13.1 10/30/2023 0924   LYMPHSABS 2.1  10/30/2023 0924   MONOABS 0.6 10/30/2023 0924   EOSABS 0.1 10/30/2023 0924   BASOSABS 0.0 10/30/2023 0924    BMET    Component Value Date/Time   NA 143 10/30/2023 0924   NA 143 02/16/2022 1006   K 4.0 10/30/2023 0924   CL 106 10/30/2023 0924   CO2 26 10/30/2023 0924   GLUCOSE 98 10/30/2023 0924   BUN 11 10/30/2023 0924   BUN 13 02/16/2022 1006   CREATININE 0.71 10/30/2023 0924   CALCIUM  9.6 10/30/2023 0924   GFRNONAA >60 10/30/2023 0924   GFRAA >60 03/06/2019 1144    BNP No results found for: BNP   Imaging:  DG Bone Density Result Date: 12/04/2023 EXAM: DUAL X-RAY ABSORPTIOMETRY (DXA) FOR BONE MINERAL DENSITY 12/04/2023 9:57 am CLINICAL DATA:  63 year old Female Postmenopausal. On an aromatase inhibitor for breast cancer. Patient is or has been on glucocorticoid therapy. TECHNIQUE: An axial (e.g., hips, spine) and/or appendicular (e.g., radius) exam was performed, as appropriate, using GE Secretary/administrator at [employer]. Images are obtained for bone mineral density measurement and are not obtained for diagnostic purposes. [id number] Exclusions: L2. COMPARISON:  None. FINDINGS: Scan quality: Good. LUMBAR SPINE (L1, L3-L4): BMD (in g/cm2): 1.399 T-score: 1.9 Z-score: 3.3 LEFT FEMORAL NECK: BMD (in g/cm2): 0.919 T-score: -0.9 Z-score: 0.5 LEFT TOTAL HIP: BMD (in g/cm2): 0.918 T-score: -0.7 Z-score: 0.3 RIGHT FEMORAL NECK: BMD (in g/cm2): 0.902 T-score: -1.0 Z-score: 0.4 RIGHT TOTAL HIP: BMD (in g/cm2): 0.932 T-score: -0.6 Z-score: 0.4 FRAX 10-YEAR PROBABILITY OF FRACTURE: FRAX not reported as the lowest BMD is not in the osteopenia range. IMPRESSION: Normal based on BMD. Fracture risk is not increased. RECOMMENDATIONS: 1. All patients should optimize calcium  and vitamin D  intake. 2. Consider FDA-approved medical therapies in postmenopausal women and men aged 28 years and older, based on the following: - A hip or vertebral (clinical or morphometric)  fracture - T-score less than or equal to -2.5 and secondary causes have been excluded. - Low bone mass (T-score between -1.0 and -2.5) and a 10-year probability of a hip fracture greater than or equal to  3% or a 10-year probability of a major osteoporosis-related fracture greater than or equal to 20% based on the [place] -adapted WHO algorithm. - Clinician judgment and/or patient preferences may indicate treatment for people with 10-year fracture probabilities above or below these levels 3. Patients with diagnosis of osteoporosis or at high risk for fracture should have regular bone mineral density tests. For patients eligible for Medicare, routine testing is allowed once every 2 years. The testing frequency can be increased to one year for patients who have rapidly progressing disease, those who are receiving or discontinuing medical therapy to restore bone mass, or have additional risk factors. Electronically Signed   By: Reyes Phi M.D.   On: 12/04/2023 13:17    Administration History     None           No data to display          No results found for: NITRICOXIDE      Assessment & Plan:   Loud snoring She has snoring, excessive daytime sleepiness, nocturnal apneic events, morning headaches, restless sleep, nocturia. BMI 41. History of HTN. Given this,  I am concerned she could have sleep disordered breathing with obstructive sleep apnea. She will need sleep study for further evaluation.    - discussed how weight can impact sleep and risk for sleep disordered breathing - discussed options to assist with weight loss: combination of diet modification, cardiovascular and strength training exercises   - had an extensive discussion regarding the adverse health consequences related to untreated sleep disordered breathing - specifically discussed the risks for hypertension, coronary artery disease, cardiac dysrhythmias, cerebrovascular disease, and diabetes - lifestyle modification  discussed   - discussed how sleep disruption can increase risk of accidents, particularly when driving - safe driving practices were discussed  Patient Instructions  Given your symptoms, I am concerned that you may have sleep disordered breathing with sleep apnea. You will need a sleep study for further evaluation. Someone will contact you to schedule this.   We discussed how untreated sleep apnea puts an individual at risk for cardiac arrhthymias, pulm HTN, DM, stroke and increases their risk for daytime accidents. We also briefly reviewed treatment options including weight loss, side sleeping position, oral appliance, CPAP therapy or referral to ENT for possible surgical options  Use caution when driving and pull over if you become sleepy.  Follow up in 6 weeks with Katie Delontae Lamm,NP to go over sleep study results, or sooner, if needed. Friday PM virtual clinic preferred       Excessive daytime sleepiness See above  Morbid obesity (HCC) BMI 41. Healthy weight loss encouraged   Advised if symptoms do not improve or worsen, to please contact office for sooner follow up or seek emergency care.   I spent 35 minutes of dedicated to the care of this patient on the date of this encounter to include pre-visit review of records, face-to-face time with the patient discussing conditions above, post visit ordering of testing, clinical documentation with the electronic health record, making appropriate referrals as documented, and communicating necessary findings to members of the patients care team.  Comer LULLA Rouleau, NP 12/26/2023  Pt aware and understands NP's role.

## 2023-12-26 NOTE — Assessment & Plan Note (Signed)
 See above.

## 2023-12-26 NOTE — Assessment & Plan Note (Signed)
 BMI 41. Healthy weight loss encouraged

## 2023-12-26 NOTE — Assessment & Plan Note (Signed)
 She has snoring, excessive daytime sleepiness, nocturnal apneic events, morning headaches, restless sleep, nocturia. BMI 41. History of HTN. Given this,  I am concerned she could have sleep disordered breathing with obstructive sleep apnea. She will need sleep study for further evaluation.    - discussed how weight can impact sleep and risk for sleep disordered breathing - discussed options to assist with weight loss: combination of diet modification, cardiovascular and strength training exercises   - had an extensive discussion regarding the adverse health consequences related to untreated sleep disordered breathing - specifically discussed the risks for hypertension, coronary artery disease, cardiac dysrhythmias, cerebrovascular disease, and diabetes - lifestyle modification discussed   - discussed how sleep disruption can increase risk of accidents, particularly when driving - safe driving practices were discussed  Patient Instructions  Given your symptoms, I am concerned that you may have sleep disordered breathing with sleep apnea. You will need a sleep study for further evaluation. Someone will contact you to schedule this.   We discussed how untreated sleep apnea puts an individual at risk for cardiac arrhthymias, pulm HTN, DM, stroke and increases their risk for daytime accidents. We also briefly reviewed treatment options including weight loss, side sleeping position, oral appliance, CPAP therapy or referral to ENT for possible surgical options  Use caution when driving and pull over if you become sleepy.  Follow up in 6 weeks with Katie Clark Clowdus,NP to go over sleep study results, or sooner, if needed. Friday PM virtual clinic preferred

## 2023-12-26 NOTE — Patient Instructions (Signed)

## 2024-01-18 DIAGNOSIS — H40003 Preglaucoma, unspecified, bilateral: Secondary | ICD-10-CM | POA: Diagnosis not present

## 2024-01-24 ENCOUNTER — Other Ambulatory Visit: Payer: Self-pay | Admitting: Hematology & Oncology

## 2024-01-25 ENCOUNTER — Encounter

## 2024-01-25 DIAGNOSIS — R0683 Snoring: Secondary | ICD-10-CM

## 2024-01-25 DIAGNOSIS — G4719 Other hypersomnia: Secondary | ICD-10-CM

## 2024-01-29 ENCOUNTER — Telehealth: Payer: Self-pay | Admitting: Family

## 2024-01-29 DIAGNOSIS — Z720 Tobacco use: Secondary | ICD-10-CM

## 2024-01-29 NOTE — Telephone Encounter (Signed)
 See order(s).

## 2024-02-03 ENCOUNTER — Telehealth: Payer: Self-pay | Admitting: Pulmonary Disease

## 2024-02-03 DIAGNOSIS — G4733 Obstructive sleep apnea (adult) (pediatric): Secondary | ICD-10-CM | POA: Diagnosis not present

## 2024-02-03 NOTE — Telephone Encounter (Signed)
 Call patient  Sleep study result  Date of study: 01/25/2024  Impression: Mild obstructive sleep apnea with an AHI of 10.0 Oxygen nadir of 82%, saturations below 88% for 0.5 minutes  Recommendation:  Options for treating mild obstructive sleep apnea may include CPAP therapy if there are significant daytime symptoms or notable comorbidities. Auto CPAP 5-15 with heated humidification and the patient's preferred mask may be considered; other treatment options may include an oral device, watchful waiting with significant weight loss efforts.  Encourage weight loss measures  Close clinical follow-up for optimization of treatment

## 2024-02-06 NOTE — Telephone Encounter (Signed)
 Informed patient of results,wanted to do the oral appliance,order placed nothing further needed

## 2024-02-13 ENCOUNTER — Encounter: Payer: Self-pay | Admitting: Nurse Practitioner

## 2024-02-13 ENCOUNTER — Ambulatory Visit: Admitting: Nurse Practitioner

## 2024-02-13 VITALS — BP 130/77 | HR 74 | Temp 97.9°F | Ht 63.0 in | Wt 238.4 lb

## 2024-02-13 DIAGNOSIS — Z6841 Body Mass Index (BMI) 40.0 and over, adult: Secondary | ICD-10-CM

## 2024-02-13 DIAGNOSIS — G4733 Obstructive sleep apnea (adult) (pediatric): Secondary | ICD-10-CM

## 2024-02-13 DIAGNOSIS — G4719 Other hypersomnia: Secondary | ICD-10-CM

## 2024-02-13 NOTE — Assessment & Plan Note (Addendum)
 BMI 41. Reviewed correlation between OSA and obesity. Healthy weight loss encouraged

## 2024-02-13 NOTE — Assessment & Plan Note (Signed)
 Mild OSA. Awaiting oral appliance consultation with orthodontics. Will notify if not a cost affordable option and then consider CPAP therapy as alternative. Encouraged continued healthy weight loss measures. Minimal cardiovascular risks associated with mild OSA. Safe driving practices reviewed.   Patient Instructions  Your sleep study showed mild sleep apnea, which poses minimal risks on the cardiovascular system. You have opted for an oral appliance. Someone should reach out to you to schedule an appointment to discuss this with the orthodontist. If this ends up being too expensive, call us  and let us  know so we can send in orders for a CPAP, like we discussed today  Follow up in 6 months with any sleep provider, or sooner, if needed

## 2024-02-13 NOTE — Patient Instructions (Signed)
 Your sleep study showed mild sleep apnea, which poses minimal risks on the cardiovascular system. You have opted for an oral appliance. Someone should reach out to you to schedule an appointment to discuss this with the orthodontist. If this ends up being too expensive, call us  and let us  know so we can send in orders for a CPAP, like we discussed today  Follow up in 6 months with any sleep provider, or sooner, if needed

## 2024-02-13 NOTE — Progress Notes (Signed)
 @Patient  ID: Jaime [name], female    DOB: October 09, 1960, 63 y.o.   MRN: [id number]  Chief Complaint  Patient presents with   Sleep Apnea    Referring provider: Daryl Setter, NP  HPI: 63 year old female, active smoker followed for mild OSA. Past medical history significant for HTN, BPH, hx of breast cancer s/p mastectomy and concurrent chemo/radiation, HLD, hx of substance abuse.  TEST/EVENTS:  01/25/2024 HST: AHI 10/h, SpO2 low 82%  12/26/2023: OV with Jerolene Kupfer NP. Jaime [name] is a 62 year old female with mild sleep apnea who presents for a sleep consult. Her symptoms have remained stable since her last sleep study conducted approximately 12-13 years ago, which indicated mild sleep apnea. She has never been on CPAP therapy. Her husband has observed episodes of apnea during sleep, although she does not wake up choking or gasping for air. She occasionally wakes herself up snoring and feels tired during the day. She wakes up at night to use the bathroom and reports drinking a lot of water, but does not experience dry mouth at night. She does not take any sleep medications. No sleep parasomnias or bruxism. Occasional morning headaches.  No issues with drowsy driving. She has not been to the dentist since 2018 due to a breast cancer diagnosis in 2019, followed by chemotherapy and surgery. In 2023, she underwent another round of treatment after a recurrence. She notes that a couple of her teeth have fallen out after undergoing chemotherapy. She's planning to get in with one.  She consumes about two 12-ounce cups of coffee daily, only in the morning, and is mindful of her caffeine intake. No alcohol intake. She is an active smoker.  She goes to bed around 9-10 pm. Falls asleep within 10 min. Wakes 2-3 times a night. Gets up around 6 am. She is retired. Does  not operate any other heavy machinery. Weight is up 20 lb in last 2 years. Lives with her sons and husband.      12/26/2023     8:00 AM  Results of the Epworth flowsheet  Sitting and reading 2  Watching TV 1  Sitting, inactive in a public place (e.g. a theatre or a meeting) 0  As a passenger in a car for an hour without a break 0  Lying down to rest in the afternoon when circumstances permit 2  Sitting and talking to someone 0  Sitting quietly after a lunch without alcohol 1  In a car, while stopped for a few minutes in traffic 0  Total score 6    02/13/2024: Today - follow up Discussed the use of AI scribe software for clinical note transcription with the patient, who gave verbal consent to proceed. History of Present Illness Jaime [name] is a 63 year old female who presents for follow-up regarding treatment options for mild sleep apnea.  She has been diagnosed with mild sleep apnea following a sleep study. She opted for an oral appliance and is waiting to hear to schedule a consult. Referral has been placed.   Her daughter mentioned that CPAP masks are now less bulky than they used to be, which she finds appealing. She'd be willing to consider this is oral appliance is not a cost affordable option.   She feels unchanged compared to her last visit. Continues to work on healthy weight loss measures.     Allergies  Allergen Reactions   Atorvastatin      constipated   Latex Hives  Allergic reaction to condoms years ago.    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  The report date is March 02, 2022.     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 then   Hx of breast cancer 02/13/2019   Right (2004) pT1cN1 cM0 triple negative s/p lumpectomy and ALND with adjuvant ddAc-T and radiation Left (2018) - Stage IIA cT2N0M0 ER-/PR-/Her2+ and Stage IA CT1CN0 ER-/PR-/Her2- both ypT1aN0(sn) grade 3 - s/p 6C neoadjuvant chemo, and mastectomy and SLNB on 09/15/2017 (records scanned)   Hyperglycemia 10/14/2022   Hypertension 10/14/2022   Loose stools 10/03/2022   Lower extremity edema 10/03/2022   Lung mass 02/13/2019   Mixed dyslipidemia 02/03/2022   Open-angle glaucoma 02/13/2019   Personal history of chemotherapy    Personal history of radiation therapy    Personal history of tobacco use, presenting hazards to health 03/12/2019   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) 03/08/2022   Right thyroid  nodule    S/P left mastectomy 02/13/2019   Sleep apnea    mild per pt, no CPAP recommended   Surgical wound infection 04/01/2022   Tobacco use 02/13/2019    Tobacco History: Social History   Tobacco Use  Smoking Status Every Day   Current packs/day: 0.25   Average packs/day: 0.3 packs/day for 42.0 years (10.5 ttl pk-yrs)   Types: Cigarettes  Smokeless Tobacco Never  Tobacco Comments   3 cigarettes a day and a vape 12/26/2023 KRD   3 cigarettes a day still and also a vape 02/13/2024 A.S   Ready to quit: Not Answered Counseling  given: Not Answered Tobacco comments: 3 cigarettes a day and a vape 12/26/2023 KRD 3 cigarettes a day still and also a vape 02/13/2024 A.S   Outpatient Medications Prior to Visit  Medication Sig Dispense Refill   Ascorbic Acid (VITAMIN C ADULT GUMMIES PO) Take by mouth.     aspirin EC 81 MG tablet Take 81 mg by mouth daily. Swallow whole.     ibuprofen (ADVIL) 200 MG tablet Take 400 mg by mouth every 6 (six) hours as needed for moderate pain.     latanoprost  (XALATAN ) 0.005 % ophthalmic solution Place 1 drop into both eyes daily.     letrozole  (FEMARA ) 2.5 MG tablet TAKE 1 TABLET BY MOUTH EVERY DAY 90 tablet 1   nitroGLYCERIN  (NITROSTAT ) 0.4 MG SL tablet Place 1 tablet (0.4 mg total) under the tongue every 5 (  five) minutes as needed for chest pain. 25 tablet 6   rosuvastatin  (CRESTOR ) 5 MG tablet Take 1 tablet (5 mg total) by mouth daily. 90 tablet 1   Vitamin D , Ergocalciferol , (DRISDOL ) 1.25 MG (50000 UNIT) CAPS capsule Take 1 capsule (50,000 Units total) by mouth every 7 (seven) days. 12 capsule 0   meclizine  (ANTIVERT ) 12.5 MG tablet Take 1 tablet (12.5 mg total) by mouth 3 (three) times daily as needed for dizziness. (Patient not taking: Reported on 02/13/2024) 30 tablet 0   No facility-administered medications prior to visit.     Review of Systems: As above    Physical Exam:  BP 130/77   Pulse 74   Temp 97.9 F (36.6 C) (Oral)   Ht 5' 3 (1.6 m) Comment: per patient  Wt 238 lb 6.4 oz (108.1 kg)   SpO2 98% Comment: on RA  BMI 42.23 kg/m   GEN: Pleasant, interactive, well-appearing; morbidly obese; in no acute distress HEENT:  Normocephalic and atraumatic. PERRLA. Sclera white. Nasal turbinates pink, moist and patent bilaterally. No rhinorrhea present. Oropharynx pink and moist, without exudate or edema. No lesions, ulcerations, or postnasal drip. Mallampati III NECK:  Supple w/ fair ROM. No thyromegaly  CV: RRR, no m/r/g PULMONARY:  Unlabored, regular breathing. Clear  bilaterally A&P w/o wheezes/rales/rhonchi. No accessory muscle use.  GI: BS present and normoactive. Soft, non-tender to palpation. MSK: Cap refil <2 sec all extrem.  Neuro: A/Ox3. No focal deficits noted.   Skin: Warm, no lesions or rashe Psych: Normal affect and behavior. Judgement and thought content appropriate.     Lab Results:  CBC    Component Value Date/Time   WBC 4.5 10/30/2023 0924   WBC 7.6 03/09/2022 0249   RBC 4.22 10/30/2023 0924   HGB 12.2 10/30/2023 0924   HCT 37.0 10/30/2023 0924   PLT 233 10/30/2023 0924   MCV 87.7 10/30/2023 0924   MCH 28.9 10/30/2023 0924   MCHC 33.0 10/30/2023 0924   RDW 13.1 10/30/2023 0924   LYMPHSABS 2.1 10/30/2023 0924   MONOABS 0.6 10/30/2023 0924   EOSABS 0.1 10/30/2023 0924   BASOSABS 0.0 10/30/2023 0924    BMET    Component Value Date/Time   NA 143 10/30/2023 0924   NA 143 02/16/2022 1006   K 4.0 10/30/2023 0924   CL 106 10/30/2023 0924   CO2 26 10/30/2023 0924   GLUCOSE 98 10/30/2023 0924   BUN 11 10/30/2023 0924   BUN 13 02/16/2022 1006   CREATININE 0.71 10/30/2023 0924   CALCIUM  9.6 10/30/2023 0924   GFRNONAA >60 10/30/2023 0924   GFRAA >60 03/06/2019 1144    BNP No results found for: BNP   Imaging:  No results found.   Administration History     None           No data to display          No results found for: NITRICOXIDE      Assessment & Plan:   Mild obstructive sleep apnea Mild OSA. Awaiting oral appliance consultation with orthodontics. Will notify if not a cost affordable option and then consider CPAP therapy as alternative. Encouraged continued healthy weight loss measures. Minimal cardiovascular risks associated with mild OSA. Safe driving practices reviewed.   Patient Instructions  Your sleep study showed mild sleep apnea, which poses minimal risks on the cardiovascular system. You have opted for an oral appliance. Someone should reach out to you to schedule an appointment  to discuss this with the  orthodontist. If this ends up being too expensive, call us  and let us  know so we can send in orders for a CPAP, like we discussed today  Follow up in 6 months with any sleep provider, or sooner, if needed    Morbid obesity (HCC) BMI 41. Reviewed correlation between OSA and obesity. Healthy weight loss encouraged   Advised if symptoms do not improve or worsen, to please contact office for sooner follow up or seek emergency care.   I spent 25 minutes of dedicated to the care of this patient on the date of this encounter to include pre-visit review of records, face-to-face time with the patient discussing conditions above, post visit ordering of testing, clinical documentation with the electronic health record, making appropriate referrals as documented, and communicating necessary findings to members of the patients care team.  Jaime LULLA Rouleau, NP 02/13/2024  Pt aware and understands NP's role.

## 2024-02-28 ENCOUNTER — Telehealth: Payer: Self-pay | Admitting: Family

## 2024-02-28 NOTE — Telephone Encounter (Signed)
 Letter has been written and routed to you for printing/fax please.

## 2024-02-28 NOTE — Telephone Encounter (Signed)
 Copied from CRM #[id number]. Topic: General - Other >> Feb 28, 2024 11:19 AM Alexandria E wrote: Reason for CRM: Patient is needing PCP to write her a letter of disability. Patient has been disabled since 2019 due to recurring cancer and other diagnoses. She is needing this letter for Medicaid. Please email letter to email on file when ready.

## 2024-02-29 ENCOUNTER — Inpatient Hospital Stay: Admitting: Hematology & Oncology

## 2024-02-29 ENCOUNTER — Inpatient Hospital Stay: Attending: Hematology & Oncology

## 2024-02-29 DIAGNOSIS — C50011 Malignant neoplasm of nipple and areola, right female breast: Secondary | ICD-10-CM | POA: Insufficient documentation

## 2024-02-29 DIAGNOSIS — Z79899 Other long term (current) drug therapy: Secondary | ICD-10-CM | POA: Insufficient documentation

## 2024-02-29 DIAGNOSIS — Z1722 Progesterone receptor negative status: Secondary | ICD-10-CM | POA: Insufficient documentation

## 2024-02-29 DIAGNOSIS — Z79811 Long term (current) use of aromatase inhibitors: Secondary | ICD-10-CM | POA: Insufficient documentation

## 2024-02-29 DIAGNOSIS — Z1732 Human epidermal growth factor receptor 2 negative status: Secondary | ICD-10-CM | POA: Insufficient documentation

## 2024-02-29 DIAGNOSIS — Z7982 Long term (current) use of aspirin: Secondary | ICD-10-CM | POA: Insufficient documentation

## 2024-02-29 DIAGNOSIS — Z170 Estrogen receptor positive status [ER+]: Secondary | ICD-10-CM | POA: Insufficient documentation

## 2024-03-06 ENCOUNTER — Telehealth: Payer: Self-pay | Admitting: Family

## 2024-03-06 NOTE — Telephone Encounter (Signed)
 Copied from CRM #[id number]. Topic: General - Other >> Mar 06, 2024 11:44 AM Jaime [name] wrote: Reason for CRM: Patient called in wanting to know if [facility] was in network with [facility] advised they accept all insurances, but would need to speak with her insurance regarding in/out of network purposes she stated that's not correct insurance is sayomg she would have to confirm with Dr. Charlet out to CAL and per CAL accept all insurances, but we do not decide if we are in/out of network with an insurance patient would need to speak with them directly. Advised patient of info and stated not sufficient enough for  her bc insurance is saying [facility] is out of network and they don't pull doctors from insurance , doctors do it themselves and would like for [facility] to call her. Please call

## 2024-03-07 ENCOUNTER — Encounter: Payer: Self-pay | Admitting: Family

## 2024-03-07 ENCOUNTER — Encounter: Payer: Self-pay | Admitting: Hematology & Oncology

## 2024-03-11 ENCOUNTER — Encounter: Payer: Self-pay | Admitting: Hematology & Oncology

## 2024-03-11 ENCOUNTER — Other Ambulatory Visit: Payer: Self-pay

## 2024-03-11 ENCOUNTER — Inpatient Hospital Stay

## 2024-03-11 ENCOUNTER — Inpatient Hospital Stay: Admitting: Hematology & Oncology

## 2024-03-11 VITALS — BP 140/63 | HR 75 | Temp 98.2°F | Resp 18 | Ht 63.5 in | Wt 238.0 lb

## 2024-03-11 DIAGNOSIS — Z170 Estrogen receptor positive status [ER+]: Secondary | ICD-10-CM

## 2024-03-11 DIAGNOSIS — Z79899 Other long term (current) drug therapy: Secondary | ICD-10-CM | POA: Diagnosis not present

## 2024-03-11 DIAGNOSIS — C50911 Malignant neoplasm of unspecified site of right female breast: Secondary | ICD-10-CM

## 2024-03-11 DIAGNOSIS — Z79811 Long term (current) use of aromatase inhibitors: Secondary | ICD-10-CM | POA: Diagnosis not present

## 2024-03-11 DIAGNOSIS — C50919 Malignant neoplasm of unspecified site of unspecified female breast: Secondary | ICD-10-CM | POA: Diagnosis not present

## 2024-03-11 DIAGNOSIS — Z1732 Human epidermal growth factor receptor 2 negative status: Secondary | ICD-10-CM | POA: Diagnosis not present

## 2024-03-11 DIAGNOSIS — C50011 Malignant neoplasm of nipple and areola, right female breast: Secondary | ICD-10-CM | POA: Diagnosis present

## 2024-03-11 DIAGNOSIS — Z1722 Progesterone receptor negative status: Secondary | ICD-10-CM | POA: Diagnosis not present

## 2024-03-11 DIAGNOSIS — Z7982 Long term (current) use of aspirin: Secondary | ICD-10-CM | POA: Diagnosis not present

## 2024-03-11 LAB — CBC WITH DIFFERENTIAL (CANCER CENTER ONLY)
Abs Immature Granulocytes: 0 K/uL (ref 0.00–0.07)
Basophils Absolute: 0 K/uL (ref 0.0–0.1)
Basophils Relative: 1 %
Eosinophils Absolute: 0.2 K/uL (ref 0.0–0.5)
Eosinophils Relative: 4 %
HCT: 39.2 % (ref 36.0–46.0)
Hemoglobin: 12.8 g/dL (ref 12.0–15.0)
Immature Granulocytes: 0 %
Lymphocytes Relative: 58 %
Lymphs Abs: 3 K/uL (ref 0.7–4.0)
MCH: 28.8 pg (ref 26.0–34.0)
MCHC: 32.7 g/dL (ref 30.0–36.0)
MCV: 88.1 fL (ref 80.0–100.0)
Monocytes Absolute: 0.7 K/uL (ref 0.1–1.0)
Monocytes Relative: 14 %
Neutro Abs: 1.2 K/uL — ABNORMAL LOW (ref 1.7–7.7)
Neutrophils Relative %: 23 %
Platelet Count: 263 K/uL (ref 150–400)
RBC: 4.45 MIL/uL (ref 3.87–5.11)
RDW: 12.8 % (ref 11.5–15.5)
WBC Count: 5.1 K/uL (ref 4.0–10.5)
nRBC: 0 % (ref 0.0–0.2)

## 2024-03-11 LAB — CMP (CANCER CENTER ONLY)
ALT: 12 U/L (ref 0–44)
AST: 20 U/L (ref 15–41)
Albumin: 4.3 g/dL (ref 3.5–5.0)
Alkaline Phosphatase: 117 U/L (ref 38–126)
Anion gap: 10 (ref 5–15)
BUN: 16 mg/dL (ref 8–23)
CO2: 29 mmol/L (ref 22–32)
Calcium: 9.9 mg/dL (ref 8.9–10.3)
Chloride: 105 mmol/L (ref 98–111)
Creatinine: 0.62 mg/dL (ref 0.44–1.00)
GFR, Estimated: >60 mL/min (ref >60)
Glucose, Bld: 90 mg/dL (ref 70–99)
Potassium: 3.7 mmol/L (ref 3.5–5.1)
Sodium: 144 mmol/L (ref 135–145)
Total Bilirubin: 0.5 mg/dL (ref 0.0–1.2)
Total Protein: 7.1 g/dL (ref 6.5–8.1)

## 2024-03-11 LAB — LACTATE DEHYDROGENASE: LDH: 229 U/L (ref 105–235)

## 2024-03-11 LAB — VITAMIN D 25 HYDROXY (VIT D DEFICIENCY, FRACTURES): Vit D, 25-Hydroxy: 76.07 ng/mL (ref 30–100)

## 2024-03-11 NOTE — Progress Notes (Signed)
 [facility] Hematology and Oncology Follow Up Visit  Jaime [name] [id number] 01/12/1961 63 y.o. 03/11/2024   Principle Diagnosis:  Stage IIa (T2N0M0) infiltrating ductal carcinoma of the right breast- ER+/PR-/HER2- --  Oncotype = 52  Current Therapy:   Status post right modified radical mastectomy on 03/08/2022 Taxotere /carboplatinum -s/p cycle 5/5 to start on 04/14/2022 -completed on 07/05/2022 Femara  2.5 mg p.o. daily-start 7 years on 08/03/2022     Interim History:  Jaime [name] is back for follow-up.  We saw her back in August.  Since then, she been doing fairly well.  She had a bone density test in September.  This showed that she had normal bone density.  I am very happy about this for her.  She continues on the Femara .  She has had no problems with the Femara .  There is been no arthralgias or myalgias.  She had a very nice Thanksgiving.  It sounds like she got had to go to Jaime [name] to help a sister who is going to have surgery.  She is not sure when the surgery will be scheduled.  She has had no change in bowel or bladder habits.  She has had no cough or shortness of breath.  There has been no issues with COVID.  She has had no rashes.  There is been no leg swelling.  Currently, I would say that her performance status is probably ECOG 1.    Medications:  Current Outpatient Medications:    Ascorbic Acid (VITAMIN C ADULT GUMMIES PO), Take by mouth., Disp: , Rfl:    aspirin EC 81 MG tablet, Take 81 mg by mouth daily. Swallow whole., Disp: , Rfl:    ibuprofen (ADVIL) 200 MG tablet, Take 400 mg by mouth every 6 (six) hours as needed for moderate pain., Disp: , Rfl:    latanoprost  (XALATAN ) 0.005 % ophthalmic solution, Place 1 drop into both eyes daily., Disp: , Rfl:    letrozole  (FEMARA ) 2.5 MG tablet, TAKE 1 TABLET BY MOUTH EVERY DAY, Disp: 90 tablet, Rfl: 1   meclizine  (ANTIVERT ) 12.5 MG tablet, Take 1 tablet (12.5 mg total) by mouth 3 (three) times daily as needed for  dizziness. (Patient not taking: Reported on 02/13/2024), Disp: 30 tablet, Rfl: 0   nitroGLYCERIN  (NITROSTAT ) 0.4 MG SL tablet, Place 1 tablet (0.4 mg total) under the tongue every 5 (five) minutes as needed for chest pain., Disp: 25 tablet, Rfl: 6   rosuvastatin  (CRESTOR ) 5 MG tablet, Take 1 tablet (5 mg total) by mouth daily., Disp: 90 tablet, Rfl: 1   Vitamin D , Ergocalciferol , (DRISDOL ) 1.25 MG (50000 UNIT) CAPS capsule, Take 1 capsule (50,000 Units total) by mouth every 7 (seven) days., Disp: 12 capsule, Rfl: 0  Allergies:  Allergies  Allergen Reactions   Atorvastatin      constipated   Latex Hives    Allergic reaction to condoms years ago.    Past Medical History, Surgical history, Social history, and Family History were reviewed and updated.  Review of Systems: Review of Systems  Constitutional: Negative.   HENT:  Negative.    Eyes: Negative.   Respiratory: Negative.    Cardiovascular: Negative.   Gastrointestinal: Negative.   Endocrine: Negative.   Genitourinary: Negative.    Musculoskeletal: Negative.   Skin: Negative.   Neurological: Negative.   Hematological: Negative.   Psychiatric/Behavioral: Negative.      Physical Exam:  Vital signs show temperature of 98.2.  Pulse 75.  Blood pressure 140/63.  Weight is 238 pounds.  Wt Readings from Last 3 Encounters:  02/13/24 238 lb 6.4 oz (108.1 kg)  12/26/23 232 lb (105.2 kg)  12/08/23 234 lb (106.1 kg)    Physical Exam Vitals reviewed.  Constitutional:      Comments: Chest wall exam shows left chest wall with mastectomy.  She has no chest wall nodules.  There is no left axillary adenopathy.  Right chest wall shows the healing mastectomy.  She has little bit of fluctuance in the lateral aspect of the mastectomy.  It is nontender.  There is some hyperpigmentation from past radiation.  There is no nipple discharge.  There is no erythema.    There is no right axillary adenopathy.  HENT:     Head: Normocephalic and  atraumatic.  Eyes:     Pupils: Pupils are equal, round, and reactive to light.  Cardiovascular:     Rate and Rhythm: Normal rate and regular rhythm.     Heart sounds: Normal heart sounds.  Pulmonary:     Effort: Pulmonary effort is normal.     Breath sounds: Normal breath sounds.  Abdominal:     General: Bowel sounds are normal.     Palpations: Abdomen is soft.  Musculoskeletal:        General: No tenderness or deformity. Normal range of motion.     Cervical back: Normal range of motion.  Lymphadenopathy:     Cervical: No cervical adenopathy.  Skin:    General: Skin is warm and dry.     Findings: No erythema or rash.  Neurological:     Mental Status: She is alert and oriented to person, place, and time.  Psychiatric:        Behavior: Behavior normal.        Thought Content: Thought content normal.        Judgment: Judgment normal.      Lab Results  Component Value Date   WBC 5.1 03/11/2024   HGB 12.8 03/11/2024   HCT 39.2 03/11/2024   MCV 88.1 03/11/2024   PLT 263 03/11/2024     Chemistry      Component Value Date/Time   NA 143 10/30/2023 0924   NA 143 02/16/2022 1006   K 4.0 10/30/2023 0924   CL 106 10/30/2023 0924   CO2 26 10/30/2023 0924   BUN 11 10/30/2023 0924   BUN 13 02/16/2022 1006   CREATININE 0.71 10/30/2023 0924      Component Value Date/Time   CALCIUM  9.6 10/30/2023 0924   ALKPHOS 105 10/30/2023 0924   AST 19 10/30/2023 0924   ALT 8 10/30/2023 0924   BILITOT 0.4 10/30/2023 0924      Impression and Plan: Jaime [name] is a very charming 63 year old postmenopausal African-American female.  This is her third breast cancer.  I do not believe this is a recurrence.  I believe this is a new breast cancer.  I am not sure as to why she keeps having breast cancer.  I think she underwent genetic testing.  I think the genetic testing was negative.  I think we now get her back in probably 6 months.  Everything seems to be going quite well for her.   Hopefully, she will be able to lose a little bit of weight.  I know that she will have a wonderful Christmas and New Year's.   Jaime JONELLE Crease, MD 12/15/20259:10 AM

## 2024-03-13 ENCOUNTER — Encounter: Payer: Self-pay | Admitting: Hematology & Oncology

## 2024-03-13 ENCOUNTER — Encounter: Payer: Self-pay | Admitting: Family

## 2024-03-23 ENCOUNTER — Other Ambulatory Visit: Payer: Self-pay | Admitting: Family

## 2024-03-29 ENCOUNTER — Encounter: Payer: Self-pay | Admitting: Hematology & Oncology

## 2024-03-29 ENCOUNTER — Encounter: Payer: Self-pay | Admitting: Family

## 2024-04-05 ENCOUNTER — Encounter: Payer: Self-pay | Admitting: Family

## 2024-04-05 ENCOUNTER — Ambulatory Visit: Payer: Self-pay | Admitting: Family

## 2024-04-05 ENCOUNTER — Encounter: Payer: Self-pay | Admitting: Hematology & Oncology

## 2024-04-05 ENCOUNTER — Ambulatory Visit: Admitting: Family

## 2024-04-05 VITALS — BP 122/77 | HR 70 | Temp 99.6°F | Ht 63.5 in | Wt 238.0 lb

## 2024-04-05 DIAGNOSIS — E559 Vitamin D deficiency, unspecified: Secondary | ICD-10-CM | POA: Insufficient documentation

## 2024-04-05 DIAGNOSIS — R739 Hyperglycemia, unspecified: Secondary | ICD-10-CM

## 2024-04-05 DIAGNOSIS — E782 Mixed hyperlipidemia: Secondary | ICD-10-CM | POA: Diagnosis not present

## 2024-04-05 DIAGNOSIS — Z720 Tobacco use: Secondary | ICD-10-CM

## 2024-04-05 DIAGNOSIS — I10 Essential (primary) hypertension: Secondary | ICD-10-CM | POA: Diagnosis not present

## 2024-04-05 LAB — LIPID PANEL
Cholesterol: 164 mg/dL (ref 28–200)
HDL: 80.8 mg/dL
LDL Cholesterol: 72 mg/dL (ref 10–99)
NonHDL: 83.22
Total CHOL/HDL Ratio: 2
Triglycerides: 55 mg/dL (ref 10.0–149.0)
VLDL: 11 mg/dL (ref 0.0–40.0)

## 2024-04-05 LAB — COMPREHENSIVE METABOLIC PANEL WITH GFR
ALT: 9 U/L (ref 3–35)
AST: 14 U/L (ref 5–37)
Albumin: 4.1 g/dL (ref 3.5–5.2)
Alkaline Phosphatase: 106 U/L (ref 39–117)
BUN: 16 mg/dL (ref 6–23)
CO2: 30 meq/L (ref 19–32)
Calcium: 9.7 mg/dL (ref 8.4–10.5)
Chloride: 105 meq/L (ref 96–112)
Creatinine, Ser: 0.65 mg/dL (ref 0.40–1.20)
GFR: 94.12 mL/min
Glucose, Bld: 90 mg/dL (ref 70–99)
Potassium: 4.5 meq/L (ref 3.5–5.1)
Sodium: 141 meq/L (ref 135–145)
Total Bilirubin: 0.6 mg/dL (ref 0.2–1.2)
Total Protein: 6.6 g/dL (ref 6.0–8.3)

## 2024-04-05 LAB — HEMOGLOBIN A1C: Hgb A1c MFr Bld: 6.1 % (ref 4.6–6.5)

## 2024-04-05 LAB — VITAMIN D 25 HYDROXY (VIT D DEFICIENCY, FRACTURES): VITD: 60.25 ng/mL (ref 30.00–100.00)

## 2024-04-05 NOTE — Progress Notes (Signed)
 "  Subjective:     Patient ID: Jaime [name], female    DOB: 10/04/1960, 64 y.o.   MRN: [id number]  Chief Complaint  Patient presents with   Follow-up    6 month follow up    HPI  Discussed the use of AI scribe software for clinical note transcription with the patient, who gave verbal consent to proceed.  History of Present Illness Jaime [name] is a 64 year old female who presents for a follow-up visit.  Her weight has remained stable over the holidays, which she considers a positive outcome. She notes that the weather has been nicer for walking.  Her cholesterol was last checked over the summer and was elevated. She is currently taking rosuvastatin  daily in the morning, which was initiated after the last cholesterol check. She is due for a recheck of her cholesterol levels.  She inquires about her vitamin D  prescription, noting that she currently takes 5000 units of vitamin D3 daily over the counter and is interested in knowing if this is equivalent to her past prescription.  She has a history of a double mastectomy, with surgeries in 2018 and 2023. She mentions that she is due for a CT scan of her lungs, which was last performed on February 13, 2023.  She continues to smoke, primarily in the morning with her coffee, and smokes on the porch. She describes difficulty quitting, particularly with the morning cigarette and coffee.  Her diabetes test from a few months ago was in the borderline range, with a result of 6.1. A year ago, it was 6.3.      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  The report date is March 02, 2022.     The CancerNext-Expanded gene panel offered by 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   Hx of breast cancer 02/13/2019   Right (2004) pT1cN1 cM0 triple negative s/p lumpectomy and ALND with adjuvant ddAc-T and radiation Left (2018) - Stage IIA cT2N0M0 ER-/PR-/Her2+ and Stage IA CT1CN0 ER-/PR-/Her2- both ypT1aN0(sn) grade 3 - s/p 6C neoadjuvant chemo, and mastectomy and SLNB on 09/15/2017 (records scanned)   Hyperglycemia 10/14/2022   Hypertension 10/14/2022   Loose stools 10/03/2022   Lower extremity edema 10/03/2022   Lung mass 02/13/2019   Mixed dyslipidemia 02/03/2022   Open-angle glaucoma 02/13/2019   Personal history of chemotherapy    Personal history of radiation therapy    Personal history of tobacco use, presenting hazards to health 03/12/2019   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   Right thyroid  nodule    S/P left mastectomy 02/13/2019   Sleep apnea    mild per pt, no CPAP recommended   Surgical wound infection 04/01/2022   Tobacco use 02/13/2019    Past Surgical History:  Procedure Laterality Date   BREAST BIOPSY Left 2019   BREAST BIOPSY Right 01/31/2022   MM RT BREAST BX W LOC DEV 1ST LESION IMAGE BX SPEC STEREO GUIDE 01/31/2022 [facility]   BREAST LUMPECTOMY Right 2004   triple Neg breast CA   DILATION AND CURETTAGE OF UTERUS  1985   MASTECTOMY Left 08/2017    TOTAL MASTECTOMY Right 03/08/2022   Procedure: RIGHT  MASTECTOMY;  Surgeon: Belinda Cough, MD;  Location: [facility] OR;  Service: General;  Laterality: Right;  PEC BLOCK   TUBAL LIGATION  1991   UNILATERAL SALPINGECTOMY  1985    Family History  Problem Relation Age of Onset   Alcohol abuse Mother    Lung cancer Mother    Esophageal cancer Mother    Throat cancer Mother    Alcohol abuse Father    Arthritis Father    Kidney disease Father    Dementia Father    Arthritis Sister    Diabetes Sister    Arthritis Brother    Hypertension Brother    Sickle cell anemia Brother    HIV Brother    Heart attack Brother 58       massive MI   Thyroid  disease Brother     Social History   Socioeconomic History   Marital status: Married    Spouse name: Not on file   Number of children: 2   Years of education: associates   Highest education level: Associate degree: occupational, scientist, product/process development, or vocational program  Occupational History   Not on file  Tobacco Use   Smoking status: Every Day    Current packs/day: 0.25    Average packs/day: 0.3 packs/day for 42.0 years (10.5 ttl pk-yrs)    Types: Cigarettes   Smokeless tobacco: Never   Tobacco comments:    3 cigarettes a day and a vape 12/26/2023 KRD    3 cigarettes a day still and also a vape 02/13/2024 A.S  Vaping Use   Vaping status: Some Days   Substances: Nicotine  Substance and Sexual Activity   Alcohol use: Not Currently    Comment: quit in 1999   Drug use: Not Currently    Comment: quit in 1996-street drugs (coccaine, marijuana)   Sexual activity: Not Currently  Other Topics Concern   Not on file  Social History Narrative   02/13/19   From: Born in [place]  but grew up in [place]   Living: with husband   Work: currently on disability      Family: Daughter - Social Worker (biological daughter) Son - Carliss (biological son) has one step son       Enjoys: walking, picnics, bowling, reading, playing cards      Exercise: walking -  tries to do everyday - uses fitbit and tries to get 5000 steps   Diet: veggies, water, 2-3 cups of coffee, trying to monitor what she eats      Safety   Seat belts: Yes    Guns: No   Safe in relationships: Yes    Social Drivers of Health   Tobacco Use: High Risk (04/05/2024)   Patient History    Smoking Tobacco Use: Every Day    Smokeless Tobacco Use: Never  Passive Exposure: Not on file  Financial Resource Strain: Medium Risk (04/04/2024)   Overall Financial Resource Strain (CARDIA)    Difficulty of Paying Living Expenses: Somewhat hard  Food Insecurity: Food Insecurity Present (04/04/2024)   Epic    Worried About Programme Researcher, Broadcasting/film/video in the Last Year: Sometimes true    Ran Out of Food in the Last Year: Sometimes true  Transportation Needs: No Transportation Needs (04/04/2024)   Epic    Lack of Transportation (Medical): No    Lack of Transportation (Non-Medical): No  Physical Activity: Inactive (04/04/2024)   Exercise Vital Sign    Days of Exercise per Week: 0 days    Minutes of Exercise per Session: Not on file  Stress: No Stress Concern Present (04/04/2024)   [employer] of Occupational Health - Occupational Stress Questionnaire    Feeling of Stress: Not at all  Social Connections: Socially Integrated (04/04/2024)   Social Connection and Isolation Panel    Frequency of Communication with Friends and Family: More than three times a week    Frequency of Social Gatherings with Friends and Family: More than three times a week    Attends Religious Services: More than 4 times per year    Active Member of [employer] or Organizations: Yes    Attends Banker Meetings: 1 to 4 times per year    Marital Status: Married  Catering Manager Violence: Not At Risk (08/01/2023)   Humiliation, Afraid, Rape, and Kick questionnaire    Fear of Current or Ex-Partner: No    Emotionally Abused: No    Physically Abused: No    Sexually Abused: No  Depression (PHQ2-9): Low Risk (04/05/2024)    Depression (PHQ2-9)    PHQ-2 Score: 2  Alcohol Screen: Low Risk (08/01/2023)   Alcohol Screen    Last Alcohol Screening Score (AUDIT): 0  Housing: High Risk (04/04/2024)   Epic    Unable to Pay for Housing in the Last Year: Yes    Number of Times Moved in the Last Year: 0    Homeless in the Last Year: No  Utilities: Not At Risk (08/01/2023)   [facility] Utilities    Threatened with loss of utilities: No  Health Literacy: Adequate Health Literacy (08/01/2023)   B1300 Health Literacy    Frequency of need for help with medical instructions: Never    Outpatient Medications Prior to Visit  Medication Sig Dispense Refill   Ascorbic Acid (VITAMIN C ADULT GUMMIES PO) Take by mouth.     aspirin EC 81 MG tablet Take 81 mg by mouth daily. Swallow whole.     ibuprofen (ADVIL) 200 MG tablet Take 400 mg by mouth every 6 (six) hours as needed for moderate pain.     latanoprost  (XALATAN ) 0.005 % ophthalmic solution Place 1 drop into both eyes daily.     letrozole  (FEMARA ) 2.5 MG tablet TAKE 1 TABLET BY MOUTH EVERY DAY 90 tablet 1   meclizine  (ANTIVERT ) 12.5 MG tablet Take 1 tablet (12.5 mg total) by mouth 3 (three) times daily as needed for dizziness. 30 tablet 0   nitroGLYCERIN  (NITROSTAT ) 0.4 MG SL tablet Place 1 tablet (0.4 mg total) under the tongue every 5 (five) minutes as needed for chest pain. 25 tablet 6   rosuvastatin  (CRESTOR ) 5 MG tablet TAKE 1 TABLET (5 MG TOTAL) BY MOUTH DAILY. 90 tablet 1   Vitamin D , Ergocalciferol , (DRISDOL ) 1.25 MG (50000 UNIT) CAPS capsule Take 1 capsule (50,000 Units total) by mouth every 7 (  seven) days. 12 capsule 0   No facility-administered medications prior to visit.    Allergies[1]  ROS    See HPI Objective:    Physical Exam Constitutional:      General: She is not in acute distress.    Appearance: Normal appearance. She is well-developed.  HENT:     Head: Normocephalic and atraumatic.     Right Ear: External ear normal.     Left Ear: External ear normal.   Eyes:     General: No scleral icterus. Neck:     Thyroid : No thyromegaly.  Cardiovascular:     Rate and Rhythm: Normal rate and regular rhythm.     Heart sounds: Normal heart sounds. No murmur heard. Pulmonary:     Effort: Pulmonary effort is normal. No respiratory distress.     Breath sounds: Normal breath sounds. No wheezing.  Musculoskeletal:     Cervical back: Neck supple.  Skin:    General: Skin is warm and dry.  Neurological:     Mental Status: She is alert and oriented to person, place, and time.  Psychiatric:        Mood and Affect: Mood normal.        Behavior: Behavior normal.        Thought Content: Thought content normal.        Judgment: Judgment normal.      BP 122/77 (BP Location: Right Arm, Patient Position: Sitting, Cuff Size: Large)   Pulse 70   Temp 99.6 F (37.6 C) (Oral)   Ht 5' 3.5 (1.613 m)   Wt 238 lb (108 kg)   SpO2 100%   BMI 41.50 kg/m  Wt Readings from Last 3 Encounters:  04/05/24 238 lb (108 kg)  03/11/24 238 lb (108 kg)  02/13/24 238 lb 6.4 oz (108.1 kg)       Assessment & Plan:   Problem List Items Addressed This Visit       Unprioritized   Vitamin D  deficiency    On 5000 units vitamin D3 daily. Vitamin D  levels to determine adequacy of supplementation. - Continue current vitamin D3 supplementation if levels are normal.       Relevant Orders   VITAMIN D  25 Hydroxy (Vit-D Deficiency, Fractures)   Tobacco abuse    Continues smoking, especially with morning coffee. Discussed health risks: lung cancer, emphysema, heart disease, bladder cancer. - Encouraged smoking cessation. - Ordered CT scan for lungs.       Relevant Orders   CT CHEST LUNG CA SCREEN LOW DOSE W/O CM   Mixed dyslipidemia - Primary   Lab Results  Component Value Date   CHOL 228 (H) 10/04/2023   HDL 78.20 10/04/2023   LDLCALC 132 (H) 10/04/2023   TRIG 88.0 10/04/2023   CHOLHDL 3 10/04/2023   Will repeat lipids now that pt is on crestor .        Relevant Orders   Lipid panel   Comp Met (CMET)   Hypertension   BP Readings from Last 3 Encounters:  04/05/24 122/77  03/11/24 (!) 140/63  02/13/24 130/77   Stable without blood pressure.       Relevant Orders   Comp Met (CMET)   Hyperglycemia    Previous HbA1c 6.1, prior 6.3. Emphasized dietary modifications and exercise to prevent diabetes progression. - Repeated diabetes test. - Advised on dietary modifications: limit added sugars, white carbs, switch to whole grains, focus on lean proteins, non-starchy vegetables. - Encouraged exercise and weight loss.  Relevant Orders   HgB A1c   Comp Met (CMET)   Assessment & Plan  General Health Maintenance Double mastectomy in 2018 and 2023. Mammogram removed from to-do list. - Ordered CT scan for lungs.   I have discontinued Kari C. Poullard's Vitamin D  (Ergocalciferol ). I am also having her maintain her latanoprost , nitroGLYCERIN , aspirin EC, ibuprofen, Ascorbic Acid (VITAMIN C ADULT GUMMIES PO), meclizine , letrozole , and rosuvastatin .  No orders of the defined types were placed in this encounter.     [1]  Allergies Allergen Reactions   Atorvastatin      constipated   Latex Hives    Allergic reaction to condoms years ago.   "

## 2024-04-05 NOTE — Assessment & Plan Note (Signed)
" °  Continues smoking, especially with morning coffee. Discussed health risks: lung cancer, emphysema, heart disease, bladder cancer. - Encouraged smoking cessation. - Ordered CT scan for lungs.  "

## 2024-04-05 NOTE — Patient Instructions (Signed)
" °  VISIT SUMMARY: During your follow-up visit, we discussed your stable weight, cholesterol management, vitamin D  supplementation, prediabetes, and smoking habits. We also reviewed your history of double mastectomy and the need for a lung CT scan.  YOUR PLAN: -MIXED HYPERLIPIDEMIA: Mixed hyperlipidemia means you have elevated cholesterol levels. We will recheck your cholesterol levels and continue your current medication, rosuvastatin  5 mg daily.  -VITAMIN D  DEFICIENCY: Vitamin D  deficiency means you have low levels of vitamin D . We will check your vitamin D  levels to ensure your current supplementation of 5000 units of vitamin D3 daily is adequate. Continue taking your current dose.  -PREDIABETES: Prediabetes means your blood sugar levels are higher than normal but not high enough to be classified as diabetes. We will repeat your diabetes test and you should focus on dietary modifications such as limiting added sugars and white carbs, switching to whole grains, and focusing on lean proteins and non-starchy vegetables. Regular exercise and weight loss are also encouraged.  -TOBACCO USE DISORDER: Tobacco use disorder means you have a dependence on tobacco. We discussed the health risks associated with smoking, including lung cancer, emphysema, heart disease, and bladder cancer. You are encouraged to quit smoking. A CT scan for your lungs has been ordered.  -GENERAL HEALTH MAINTENANCE: Given your history of double mastectomy in 2018 and 2023, mammograms are no longer necessary. A CT scan for your lungs has been ordered to monitor your health.  INSTRUCTIONS: Please follow up with the recheck of your cholesterol and vitamin D  levels, and repeat the diabetes test as discussed. Additionally, schedule the CT scan for your lungs as ordered.                                   "

## 2024-04-05 NOTE — Assessment & Plan Note (Signed)
 BP Readings from Last 3 Encounters:  04/05/24 122/77  03/11/24 (!) 140/63  02/13/24 130/77   Stable without blood pressure.

## 2024-04-05 NOTE — Assessment & Plan Note (Signed)
" °  Previous HbA1c 6.1, prior 6.3. Emphasized dietary modifications and exercise to prevent diabetes progression. - Repeated diabetes test. - Advised on dietary modifications: limit added sugars, white carbs, switch to whole grains, focus on lean proteins, non-starchy vegetables. - Encouraged exercise and weight loss. "

## 2024-04-05 NOTE — Assessment & Plan Note (Signed)
" °  On 5000 units vitamin D3 daily. Vitamin D  levels to determine adequacy of supplementation. - Continue current vitamin D3 supplementation if levels are normal.  "

## 2024-04-05 NOTE — Assessment & Plan Note (Signed)
 Lab Results  Component Value Date   CHOL 228 (H) 10/04/2023   HDL 78.20 10/04/2023   LDLCALC 132 (H) 10/04/2023   TRIG 88.0 10/04/2023   CHOLHDL 3 10/04/2023   Will repeat lipids now that pt is on crestor .

## 2024-04-06 ENCOUNTER — Other Ambulatory Visit: Payer: Self-pay

## 2024-04-10 ENCOUNTER — Encounter: Payer: Self-pay | Admitting: Family

## 2024-04-10 ENCOUNTER — Encounter: Payer: Self-pay | Admitting: Hematology & Oncology

## 2024-04-15 ENCOUNTER — Encounter: Payer: Self-pay | Admitting: Family

## 2024-04-15 ENCOUNTER — Encounter: Payer: Self-pay | Admitting: Hematology & Oncology

## 2024-04-24 ENCOUNTER — Ambulatory Visit (HOSPITAL_BASED_OUTPATIENT_CLINIC_OR_DEPARTMENT_OTHER): Admitting: Radiology

## 2024-04-24 ENCOUNTER — Ambulatory Visit (HOSPITAL_BASED_OUTPATIENT_CLINIC_OR_DEPARTMENT_OTHER)
Admission: RE | Admit: 2024-04-24 | Discharge: 2024-04-24 | Disposition: A | Discharge status: Home / Self Care | Source: Ambulatory Visit | Attending: Family | Admitting: Family

## 2024-04-24 DIAGNOSIS — J439 Emphysema, unspecified: Secondary | ICD-10-CM | POA: Insufficient documentation

## 2024-04-24 DIAGNOSIS — F17210 Nicotine dependence, cigarettes, uncomplicated: Secondary | ICD-10-CM | POA: Diagnosis not present

## 2024-04-24 DIAGNOSIS — I700 Atherosclerosis of aorta: Secondary | ICD-10-CM | POA: Diagnosis not present

## 2024-04-24 DIAGNOSIS — Z122 Encounter for screening for malignant neoplasm of respiratory organs: Secondary | ICD-10-CM | POA: Insufficient documentation

## 2024-04-24 DIAGNOSIS — Z720 Tobacco use: Secondary | ICD-10-CM

## 2024-06-11 ENCOUNTER — Ambulatory Visit: Admitting: Family

## 2024-07-29 ENCOUNTER — Inpatient Hospital Stay

## 2024-07-29 ENCOUNTER — Inpatient Hospital Stay: Admitting: Hematology & Oncology

## 2024-08-06 ENCOUNTER — Ambulatory Visit

## 2024-10-04 ENCOUNTER — Ambulatory Visit: Admitting: Family
